# Patient Record
Sex: Female | Born: 1980 | Race: White | Hispanic: No | Marital: Single | Smoking: Former smoker
Health system: Southern US, Community
[De-identification: ages and names within clinical notes are randomized; demographics above are authoritative.]

## PROBLEM LIST (undated history)

## (undated) ENCOUNTER — Inpatient Hospital Stay (HOSPITAL_COMMUNITY): Payer: Self-pay

## (undated) DIAGNOSIS — F419 Anxiety disorder, unspecified: Secondary | ICD-10-CM

## (undated) DIAGNOSIS — F1910 Other psychoactive substance abuse, uncomplicated: Secondary | ICD-10-CM

## (undated) DIAGNOSIS — F32A Depression, unspecified: Secondary | ICD-10-CM

## (undated) DIAGNOSIS — B1920 Unspecified viral hepatitis C without hepatic coma: Secondary | ICD-10-CM

## (undated) DIAGNOSIS — G43909 Migraine, unspecified, not intractable, without status migrainosus: Secondary | ICD-10-CM

## (undated) DIAGNOSIS — I10 Essential (primary) hypertension: Secondary | ICD-10-CM

## (undated) DIAGNOSIS — F329 Major depressive disorder, single episode, unspecified: Secondary | ICD-10-CM

## (undated) DIAGNOSIS — M549 Dorsalgia, unspecified: Secondary | ICD-10-CM

## (undated) DIAGNOSIS — F4310 Post-traumatic stress disorder, unspecified: Secondary | ICD-10-CM

## (undated) DIAGNOSIS — G8929 Other chronic pain: Secondary | ICD-10-CM

## (undated) HISTORY — DX: Unspecified viral hepatitis C without hepatic coma: B19.20

## (undated) HISTORY — PX: TUBAL LIGATION: SHX77

## (undated) HISTORY — DX: Post-traumatic stress disorder, unspecified: F43.10

## (undated) HISTORY — PX: EYE SURGERY: SHX253

## (undated) HISTORY — PX: CARPAL TUNNEL RELEASE: SHX101

## (undated) HISTORY — PX: ABDOMINAL HYSTERECTOMY: SHX81

---

## 2009-05-15 ENCOUNTER — Emergency Department (HOSPITAL_COMMUNITY): Admission: EM | Admit: 2009-05-15 | Discharge: 2009-05-15 | Payer: Self-pay | Admitting: Emergency Medicine

## 2009-09-08 ENCOUNTER — Emergency Department (HOSPITAL_COMMUNITY): Admission: EM | Admit: 2009-09-08 | Discharge: 2009-09-08 | Payer: Self-pay | Admitting: Emergency Medicine

## 2009-09-09 ENCOUNTER — Emergency Department (HOSPITAL_COMMUNITY): Admission: EM | Admit: 2009-09-09 | Discharge: 2009-09-09 | Payer: Self-pay | Admitting: Emergency Medicine

## 2009-09-20 ENCOUNTER — Emergency Department (HOSPITAL_COMMUNITY): Admission: EM | Admit: 2009-09-20 | Discharge: 2009-09-20 | Payer: Self-pay | Admitting: Emergency Medicine

## 2010-05-29 ENCOUNTER — Emergency Department (HOSPITAL_COMMUNITY)
Admission: EM | Admit: 2010-05-29 | Discharge: 2010-05-29 | Disposition: A | Discharge status: Home / Self Care | Payer: Self-pay | Attending: Emergency Medicine | Admitting: Emergency Medicine

## 2010-05-29 DIAGNOSIS — F4310 Post-traumatic stress disorder, unspecified: Secondary | ICD-10-CM | POA: Insufficient documentation

## 2010-05-29 DIAGNOSIS — G2581 Restless legs syndrome: Secondary | ICD-10-CM | POA: Insufficient documentation

## 2010-05-29 DIAGNOSIS — F10229 Alcohol dependence with intoxication, unspecified: Secondary | ICD-10-CM | POA: Insufficient documentation

## 2010-05-29 DIAGNOSIS — Z79899 Other long term (current) drug therapy: Secondary | ICD-10-CM | POA: Insufficient documentation

## 2010-05-29 DIAGNOSIS — F919 Conduct disorder, unspecified: Secondary | ICD-10-CM | POA: Insufficient documentation

## 2010-05-29 DIAGNOSIS — F1910 Other psychoactive substance abuse, uncomplicated: Secondary | ICD-10-CM | POA: Insufficient documentation

## 2010-05-29 DIAGNOSIS — G8929 Other chronic pain: Secondary | ICD-10-CM | POA: Insufficient documentation

## 2010-05-29 DIAGNOSIS — I10 Essential (primary) hypertension: Secondary | ICD-10-CM | POA: Insufficient documentation

## 2010-05-29 LAB — BASIC METABOLIC PANEL
BUN: 2 mg/dL — ABNORMAL LOW (ref 6–23)
CO2: 24 mEq/L (ref 19–32)
Calcium: 9.7 mg/dL (ref 8.4–10.5)
Chloride: 105 mEq/L (ref 96–112)
Creatinine, Ser: 0.67 mg/dL (ref 0.4–1.2)
GFR calc Af Amer: >60 mL/min (ref >60)
GFR calc non Af Amer: >60 mL/min (ref >60)
Glucose, Bld: 114 mg/dL — ABNORMAL HIGH (ref 70–99)
Potassium: 3.4 mEq/L — ABNORMAL LOW (ref 3.5–5.1)
Sodium: 139 mEq/L (ref 135–145)

## 2010-05-29 LAB — POCT PREGNANCY, URINE: Preg Test, Ur: NEGATIVE

## 2010-05-29 LAB — CBC
HCT: 37.2 % (ref 36.0–46.0)
Hemoglobin: 13 g/dL (ref 12.0–15.0)
MCH: 32.2 pg (ref 26.0–34.0)
MCHC: 34.9 g/dL (ref 30.0–36.0)
MCV: 92.1 fL (ref 78.0–100.0)
Platelets: 333 10*3/uL (ref 150–400)
RBC: 4.04 MIL/uL (ref 3.87–5.11)
RDW: 12.8 % (ref 11.5–15.5)
WBC: 11.6 10*3/uL — ABNORMAL HIGH (ref 4.0–10.5)

## 2010-05-29 LAB — URINALYSIS, ROUTINE W REFLEX MICROSCOPIC
Bilirubin Urine: NEGATIVE
Glucose, UA: NEGATIVE mg/dL
Ketones, ur: NEGATIVE mg/dL
Leukocytes, UA: NEGATIVE
Nitrite: NEGATIVE
Protein, ur: NEGATIVE mg/dL
Specific Gravity, Urine: <1.005 — ABNORMAL LOW (ref 1.005–1.030)
Urobilinogen, UA: 0.2 mg/dL (ref 0.0–1.0)
pH: 6 (ref 5.0–8.0)

## 2010-05-29 LAB — RAPID URINE DRUG SCREEN, HOSP PERFORMED
Amphetamines: NOT DETECTED
Barbiturates: NOT DETECTED
Benzodiazepines: NOT DETECTED
Cocaine: NOT DETECTED
Opiates: NOT DETECTED
Tetrahydrocannabinol: NOT DETECTED

## 2010-05-29 LAB — URINE MICROSCOPIC-ADD ON

## 2010-05-29 LAB — DIFFERENTIAL
Basophils Absolute: 0 10*3/uL (ref 0.0–0.1)
Basophils Relative: 0 % (ref 0–1)
Eosinophils Absolute: 0.2 10*3/uL (ref 0.0–0.7)
Eosinophils Relative: 1 % (ref 0–5)
Lymphocytes Relative: 45 % (ref 12–46)
Lymphs Abs: 5.2 10*3/uL — ABNORMAL HIGH (ref 0.7–4.0)
Monocytes Absolute: 0.6 10*3/uL (ref 0.1–1.0)
Monocytes Relative: 5 % (ref 3–12)
Neutro Abs: 5.6 10*3/uL (ref 1.7–7.7)
Neutrophils Relative %: 48 % (ref 43–77)

## 2010-05-29 LAB — ETHANOL: Alcohol, Ethyl (B): 175 mg/dL — ABNORMAL HIGH (ref 0–10)

## 2010-07-23 ENCOUNTER — Emergency Department (HOSPITAL_COMMUNITY)
Admission: EM | Admit: 2010-07-23 | Discharge: 2010-07-23 | Disposition: A | Discharge status: Home / Self Care | Payer: Self-pay | Attending: Emergency Medicine | Admitting: Emergency Medicine

## 2010-07-23 DIAGNOSIS — G2581 Restless legs syndrome: Secondary | ICD-10-CM | POA: Insufficient documentation

## 2010-07-23 DIAGNOSIS — Z79899 Other long term (current) drug therapy: Secondary | ICD-10-CM | POA: Insufficient documentation

## 2010-07-23 DIAGNOSIS — R079 Chest pain, unspecified: Secondary | ICD-10-CM | POA: Insufficient documentation

## 2010-07-23 DIAGNOSIS — F4310 Post-traumatic stress disorder, unspecified: Secondary | ICD-10-CM | POA: Insufficient documentation

## 2010-07-23 DIAGNOSIS — F3289 Other specified depressive episodes: Secondary | ICD-10-CM | POA: Insufficient documentation

## 2010-07-23 DIAGNOSIS — F329 Major depressive disorder, single episode, unspecified: Secondary | ICD-10-CM | POA: Insufficient documentation

## 2010-07-23 DIAGNOSIS — M549 Dorsalgia, unspecified: Secondary | ICD-10-CM | POA: Insufficient documentation

## 2010-07-23 DIAGNOSIS — I10 Essential (primary) hypertension: Secondary | ICD-10-CM | POA: Insufficient documentation

## 2010-07-23 DIAGNOSIS — G4700 Insomnia, unspecified: Secondary | ICD-10-CM | POA: Insufficient documentation

## 2010-07-23 DIAGNOSIS — G8929 Other chronic pain: Secondary | ICD-10-CM | POA: Insufficient documentation

## 2010-07-23 DIAGNOSIS — F1210 Cannabis abuse, uncomplicated: Secondary | ICD-10-CM | POA: Insufficient documentation

## 2010-07-23 LAB — BASIC METABOLIC PANEL
BUN: 6 mg/dL (ref 6–23)
CO2: 25 mEq/L (ref 19–32)
Calcium: 9.4 mg/dL (ref 8.4–10.5)
Chloride: 101 mEq/L (ref 96–112)
Creatinine, Ser: 0.67 mg/dL (ref 0.50–1.10)
GFR calc Af Amer: >60 mL/min (ref >60)
GFR calc non Af Amer: >60 mL/min (ref >60)
Glucose, Bld: 100 mg/dL — ABNORMAL HIGH (ref 70–99)
Potassium: 3.8 mEq/L (ref 3.5–5.1)
Sodium: 136 mEq/L (ref 135–145)

## 2010-07-23 LAB — CBC
HCT: 36.4 % (ref 36.0–46.0)
Hemoglobin: 12.6 g/dL (ref 12.0–15.0)
MCH: 32.6 pg (ref 26.0–34.0)
MCHC: 34.6 g/dL (ref 30.0–36.0)
MCV: 94.3 fL (ref 78.0–100.0)
Platelets: 289 10*3/uL (ref 150–400)
RBC: 3.86 MIL/uL — ABNORMAL LOW (ref 3.87–5.11)
RDW: 13 % (ref 11.5–15.5)
WBC: 12.6 10*3/uL — ABNORMAL HIGH (ref 4.0–10.5)

## 2010-07-23 LAB — DIFFERENTIAL
Basophils Absolute: 0 10*3/uL (ref 0.0–0.1)
Basophils Relative: 0 % (ref 0–1)
Eosinophils Absolute: 0.1 10*3/uL (ref 0.0–0.7)
Eosinophils Relative: 0 % (ref 0–5)
Lymphocytes Relative: 16 % (ref 12–46)
Lymphs Abs: 2 10*3/uL (ref 0.7–4.0)
Monocytes Absolute: 0.8 10*3/uL (ref 0.1–1.0)
Monocytes Relative: 6 % (ref 3–12)
Neutro Abs: 9.7 10*3/uL — ABNORMAL HIGH (ref 1.7–7.7)
Neutrophils Relative %: 77 % (ref 43–77)

## 2010-07-23 LAB — TROPONIN I: Troponin I: <0.3 ng/mL (ref <0.30)

## 2010-07-23 LAB — CK TOTAL AND CKMB (NOT AT ARMC)
CK, MB: 1.6 ng/mL (ref 0.3–4.0)
Relative Index: INVALID (ref 0.0–2.5)
Total CK: 51 U/L (ref 7–177)

## 2010-07-23 LAB — RAPID STREP SCREEN (MED CTR MEBANE ONLY): Streptococcus, Group A Screen (Direct): NEGATIVE

## 2010-07-23 LAB — POCT PREGNANCY, URINE: Preg Test, Ur: NEGATIVE

## 2010-07-23 LAB — D-DIMER, QUANTITATIVE: D-Dimer, Quant: 0.39 ug/mL-FEU (ref 0.00–0.48)

## 2011-01-19 LAB — RPR: RPR: NONREACTIVE

## 2011-01-31 NOTE — L&D Delivery Note (Signed)
Delivery Note At 11:07 AM a viable and healthy female was delivered via  (Presentation: ROA ).  APGAR: 7, 9; weight pending.   Placenta status: delivered, intact.  Cord: 2 vessels with the following complications: none. 30 second shoulder dystocia resolved with McRoberts, suprapubic pressure.  Anesthesia: Epidural  Episiotomy: n/a Lacerations: none. Est. Blood Loss (mL): 100  Mom to postpartum.  Baby to nursery-stable.  Bedelia Person 06/14/2011, 11:19 AM  I was present for the delivery of this patient.  Agree with the above note.    Candelaria Celeste JEHIEL 06/14/2011 11:24 AM

## 2011-02-16 ENCOUNTER — Other Ambulatory Visit (HOSPITAL_COMMUNITY)
Admission: RE | Admit: 2011-02-16 | Discharge: 2011-02-16 | Disposition: A | Discharge status: Home / Self Care | Payer: Non-veteran care | Source: Ambulatory Visit | Attending: Obstetrics & Gynecology | Admitting: Obstetrics & Gynecology

## 2011-02-16 DIAGNOSIS — Z01419 Encounter for gynecological examination (general) (routine) without abnormal findings: Secondary | ICD-10-CM | POA: Insufficient documentation

## 2011-02-16 LAB — GC/CHLAMYDIA PROBE AMP, GENITAL
Chlamydia: NEGATIVE
Gonorrhea: NEGATIVE

## 2011-02-16 LAB — ABO/RH: RH Type: POSITIVE

## 2011-02-16 LAB — HEPATITIS B SURFACE ANTIGEN: Hepatitis B Surface Ag: NEGATIVE

## 2011-02-16 LAB — HIV ANTIBODY (ROUTINE TESTING W REFLEX): HIV: NONREACTIVE

## 2011-02-16 LAB — RUBELLA ANTIBODY, IGM: Rubella: IMMUNE

## 2011-05-10 ENCOUNTER — Encounter (HOSPITAL_COMMUNITY): Payer: Self-pay | Admitting: *Deleted

## 2011-05-10 ENCOUNTER — Inpatient Hospital Stay (HOSPITAL_COMMUNITY)
Admission: AD | Admit: 2011-05-10 | Discharge: 2011-05-11 | DRG: 781 | Disposition: A | Discharge status: Home / Self Care | Payer: Non-veteran care | Source: Ambulatory Visit | Attending: Obstetrics and Gynecology | Admitting: Obstetrics and Gynecology

## 2011-05-10 DIAGNOSIS — R030 Elevated blood-pressure reading, without diagnosis of hypertension: Secondary | ICD-10-CM | POA: Diagnosis present

## 2011-05-10 DIAGNOSIS — D72829 Elevated white blood cell count, unspecified: Secondary | ICD-10-CM | POA: Diagnosis present

## 2011-05-10 DIAGNOSIS — O212 Late vomiting of pregnancy: Secondary | ICD-10-CM | POA: Diagnosis present

## 2011-05-10 DIAGNOSIS — O219 Vomiting of pregnancy, unspecified: Secondary | ICD-10-CM

## 2011-05-10 DIAGNOSIS — K219 Gastro-esophageal reflux disease without esophagitis: Secondary | ICD-10-CM | POA: Diagnosis present

## 2011-05-10 DIAGNOSIS — O99891 Other specified diseases and conditions complicating pregnancy: Principal | ICD-10-CM | POA: Diagnosis present

## 2011-05-10 DIAGNOSIS — K529 Noninfective gastroenteritis and colitis, unspecified: Secondary | ICD-10-CM | POA: Diagnosis present

## 2011-05-10 HISTORY — DX: Essential (primary) hypertension: I10

## 2011-05-10 HISTORY — DX: Depression, unspecified: F32.A

## 2011-05-10 HISTORY — DX: Anxiety disorder, unspecified: F41.9

## 2011-05-10 HISTORY — DX: Major depressive disorder, single episode, unspecified: F32.9

## 2011-05-10 LAB — CBC
HCT: 35.3 % — ABNORMAL LOW (ref 36.0–46.0)
Hemoglobin: 11.9 g/dL — ABNORMAL LOW (ref 12.0–15.0)
MCH: 31.2 pg (ref 26.0–34.0)
MCHC: 33.7 g/dL (ref 30.0–36.0)
MCV: 92.4 fL (ref 78.0–100.0)
Platelets: 292 10*3/uL (ref 150–400)
RBC: 3.82 MIL/uL — ABNORMAL LOW (ref 3.87–5.11)
RDW: 14.4 % (ref 11.5–15.5)
WBC: 18.4 10*3/uL — ABNORMAL HIGH (ref 4.0–10.5)

## 2011-05-10 LAB — URINALYSIS, ROUTINE W REFLEX MICROSCOPIC
Bilirubin Urine: NEGATIVE
Glucose, UA: NEGATIVE mg/dL
Ketones, ur: >80 mg/dL — AB
Leukocytes, UA: NEGATIVE
Nitrite: NEGATIVE
Protein, ur: 30 mg/dL — AB
Specific Gravity, Urine: >1.03 — ABNORMAL HIGH (ref 1.005–1.030)
Urobilinogen, UA: 1 mg/dL (ref 0.0–1.0)
pH: 6 (ref 5.0–8.0)

## 2011-05-10 LAB — URINE MICROSCOPIC-ADD ON

## 2011-05-10 MED ORDER — SODIUM CHLORIDE 0.9 % IV SOLN
25.0000 mg | INTRAVENOUS | Status: DC
  Administered 2011-05-10: 25 mg via INTRAVENOUS
  Filled 2011-05-10: qty 1

## 2011-05-10 MED ORDER — ONDANSETRON 4 MG PO TBDP
4.0000 mg | ORAL_TABLET | Freq: Once | ORAL | Status: AC
  Administered 2011-05-10: 4 mg via ORAL
  Filled 2011-05-10: qty 1

## 2011-05-10 MED ORDER — LACTATED RINGERS IV BOLUS (SEPSIS)
1000.0000 mL | Freq: Once | INTRAVENOUS | Status: AC
  Administered 2011-05-11: 1000 mL via INTRAVENOUS

## 2011-05-10 NOTE — MAU Note (Signed)
Pt reports nausea and vomiting x 3 days, denies diarrhea. Headache. Denies vaginal bleeding or discharge.

## 2011-05-10 NOTE — MAU Provider Note (Signed)
Marcia Hood y.o.G2P1001 @[redacted]w[redacted]d  by LMP Chief Complaint  Patient presents with  . Emesis     First Provider Initiated Contact with Patient 05/10/11 2315      SUBJECTIVE  HPI:  3 d hx of vomiting progressively worsening and today unable to retain even water. Feels weak and fatigued. No diarrhea or fever. No dysuria, hematuria, back pain. No known contact with others ill. No abd pain, contractions, LOF or VB. Good FM.    Past Medical History  Diagnosis Date  . Hypertension   . Depression   . Anxiety    Past Surgical History  Procedure Date  . No past surgeries    History   Social History  . Marital Status: Single    Spouse Name: N/A    Number of Children: N/A  . Years of Education: N/A   Occupational History  . Not on file.   Social History Main Topics  . Smoking status: Current Everyday Smoker    Types: Cigarettes  . Smokeless tobacco: Not on file  . Alcohol Use: No  . Drug Use: No  . Sexually Active: Yes   Other Topics Concern  . Not on file   Social History Narrative  . No narrative on file   No current facility-administered medications on file prior to encounter.   Current Outpatient Prescriptions on File Prior to Encounter  Medication Sig Dispense Refill  . FLUoxetine (PROZAC) 20 MG capsule Take 20 mg by mouth daily.       Allergies  Allergen Reactions  . Penicillins Rash    ROS: Pertinent items in HPI  OBJECTIVE Blood pressure 137/85, pulse 83, temperature 97.3 F (36.3 C), temperature source Oral, resp. rate 20, height 5\' 4"  (1.626 m), weight 76.658 kg (169 lb).  Recheck 98.3  150/83 GENERAL: Well-developed, well-nourished female in no acute distress.  LUNGS: Clear to auscultation bilaterally.  HEART: Regular rate and rhythm. ABDOMEN: Soft, nontender EXTREMITIES: Nontender, no edema  LAB RESULTS Results for orders placed during the hospital encounter of 05/10/11 (from the past 24 hour(s))  URINALYSIS, ROUTINE W REFLEX MICROSCOPIC      Status: Abnormal   Collection Time   05/10/11 10:30 PM      Component Value Range   Color, Urine YELLOW  YELLOW    APPearance CLEAR  CLEAR    Specific Gravity, Urine >1.030 (*) 1.005 - 1.030    pH 6.0  5.0 - 8.0    Glucose, UA NEGATIVE  NEGATIVE (mg/dL)   Hgb urine dipstick TRACE (*) NEGATIVE    Bilirubin Urine NEGATIVE  NEGATIVE    Ketones, ur >80 (*) NEGATIVE (mg/dL)   Protein, ur 30 (*) NEGATIVE (mg/dL)   Urobilinogen, UA 1.0  0.0 - 1.0 (mg/dL)   Nitrite NEGATIVE  NEGATIVE    Leukocytes, UA NEGATIVE  NEGATIVE   URINE MICROSCOPIC-ADD ON     Status: Abnormal   Collection Time   05/10/11 10:30 PM      Component Value Range   Squamous Epithelial / LPF MANY (*) RARE    WBC, UA 0-2  <3 (WBC/hpf)   RBC / HPF 0-2  <3 (RBC/hpf)   Bacteria, UA RARE  RARE    Casts HYALINE CASTS (*) NEGATIVE    Urine-Other MUCOUS PRESENT    COMPREHENSIVE METABOLIC PANEL     Status: Abnormal   Collection Time   05/10/11 11:30 PM      Component Value Range   Sodium 139  135 - 145 (mEq/L)  Potassium 3.3 (*) 3.5 - 5.1 (mEq/L)   Chloride 103  96 - 112 (mEq/L)   CO2 12 (*) 19 - 32 (mEq/L)   Glucose, Bld 70  70 - 99 (mg/dL)   BUN 4 (*) 6 - 23 (mg/dL)   Creatinine, Ser 4.54  0.50 - 1.10 (mg/dL)   Calcium 9.6  8.4 - 09.8 (mg/dL)   Total Protein 6.8  6.0 - 8.3 (g/dL)   Albumin 3.4 (*) 3.5 - 5.2 (g/dL)   AST 14  0 - 37 (U/L)   ALT 11  0 - 35 (U/L)   Alkaline Phosphatase 94  39 - 117 (U/L)   Total Bilirubin 0.4  0.3 - 1.2 (mg/dL)   GFR calc non Af Amer >90  >90 (mL/min)   GFR calc Af Amer >90  >90 (mL/min)  CBC     Status: Abnormal   Collection Time   05/10/11 11:30 PM      Component Value Range   WBC 18.4 (*) 4.0 - 10.5 (K/uL)   RBC 3.82 (*) 3.87 - 5.11 (MIL/uL)   Hemoglobin 11.9 (*) 12.0 - 15.0 (g/dL)   HCT 11.9 (*) 14.7 - 46.0 (%)   MCV 92.4  78.0 - 100.0 (fL)   MCH 31.2  26.0 - 34.0 (pg)   MCHC 33.7  30.0 - 36.0 (g/dL)   RDW 82.9  56.2 - 13.0 (%)   Platelets 292  150 - 400 (K/uL)  DIFFERENTIAL      Status: Normal (Preliminary result)   Collection Time   05/10/11 11:30 PM      Component Value Range   Neutrophils Relative PENDING  43 - 77 (%)   Neutro Abs PENDING  1.7 - 7.7 (K/uL)   Band Neutrophils PENDING  0 - 10 (%)   Lymphocytes Relative PENDING  12 - 46 (%)   Lymphs Abs PENDING  0.7 - 4.0 (K/uL)   Monocytes Relative PENDING  3 - 12 (%)   Monocytes Absolute PENDING  0.1 - 1.0 (K/uL)   Eosinophils Relative PENDING  0 - 5 (%)   Eosinophils Absolute PENDING  0.0 - 0.7 (K/uL)   Basophils Relative PENDING  0 - 1 (%)   Basophils Absolute PENDING  0.0 - 0.1 (K/uL)   WBC Morphology PENDING     RBC Morphology PENDING     Smear Review PENDING     nRBC PENDING  0 (/100 WBC)   Metamyelocytes Relative PENDING     Myelocytes PENDING     Promyelocytes Absolute PENDING     Blasts PENDING      MAU Course:   Zofran ODT 4mg , IV LR with Phenergan After first 1000cc infused, still vomiting and c/o indigestion. Will give IV Pepcid and a second bag of IVF with Zofran Still vomiting   ASSESSMENT G2P1001 with N/V Dehydratioin Leukocytosis BP elevation  PLAN C/W Dr. Emelda Fear -> Will admit

## 2011-05-11 ENCOUNTER — Encounter (HOSPITAL_COMMUNITY): Payer: Self-pay

## 2011-05-11 DIAGNOSIS — O219 Vomiting of pregnancy, unspecified: Secondary | ICD-10-CM

## 2011-05-11 DIAGNOSIS — K529 Noninfective gastroenteritis and colitis, unspecified: Secondary | ICD-10-CM | POA: Diagnosis present

## 2011-05-11 LAB — COMPREHENSIVE METABOLIC PANEL
ALT: 11 U/L (ref 0–35)
AST: 14 U/L (ref 0–37)
Albumin: 3.4 g/dL — ABNORMAL LOW (ref 3.5–5.2)
Alkaline Phosphatase: 94 U/L (ref 39–117)
BUN: 4 mg/dL — ABNORMAL LOW (ref 6–23)
CO2: 12 mEq/L — ABNORMAL LOW (ref 19–32)
Calcium: 9.6 mg/dL (ref 8.4–10.5)
Chloride: 103 mEq/L (ref 96–112)
Creatinine, Ser: 0.65 mg/dL (ref 0.50–1.10)
GFR calc Af Amer: >90 mL/min (ref >90)
GFR calc non Af Amer: >90 mL/min (ref >90)
Glucose, Bld: 70 mg/dL (ref 70–99)
Potassium: 3.3 mEq/L — ABNORMAL LOW (ref 3.5–5.1)
Sodium: 139 mEq/L (ref 135–145)
Total Bilirubin: 0.4 mg/dL (ref 0.3–1.2)
Total Protein: 6.8 g/dL (ref 6.0–8.3)

## 2011-05-11 LAB — DIFFERENTIAL
Basophils Absolute: 0.1 10*3/uL (ref 0.0–0.1)
Basophils Relative: 0 % (ref 0–1)
Eosinophils Absolute: 0 10*3/uL (ref 0.0–0.7)
Eosinophils Relative: 0 % (ref 0–5)
Lymphocytes Relative: 14 % (ref 12–46)
Lymphs Abs: 2.7 10*3/uL (ref 0.7–4.0)
Monocytes Absolute: 1 10*3/uL (ref 0.1–1.0)
Monocytes Relative: 5 % (ref 3–12)
Neutro Abs: 15.1 10*3/uL — ABNORMAL HIGH (ref 1.7–7.7)
Neutrophils Relative %: 80 % — ABNORMAL HIGH (ref 43–77)

## 2011-05-11 LAB — CBC
HCT: 30.4 % — ABNORMAL LOW (ref 36.0–46.0)
Hemoglobin: 10 g/dL — ABNORMAL LOW (ref 12.0–15.0)
MCH: 30.5 pg (ref 26.0–34.0)
MCHC: 32.9 g/dL (ref 30.0–36.0)
MCV: 92.7 fL (ref 78.0–100.0)
Platelets: 264 10*3/uL (ref 150–400)
RBC: 3.28 MIL/uL — ABNORMAL LOW (ref 3.87–5.11)
RDW: 14.4 % (ref 11.5–15.5)
WBC: 15.6 10*3/uL — ABNORMAL HIGH (ref 4.0–10.5)

## 2011-05-11 LAB — PROTEIN / CREATININE RATIO, URINE
Creatinine, Urine: 98.33 mg/dL
Protein Creatinine Ratio: 0.29 — ABNORMAL HIGH (ref 0.00–0.15)
Total Protein, Urine: 29 mg/dL

## 2011-05-11 MED ORDER — CALCIUM CARBONATE ANTACID 500 MG PO CHEW
2.0000 | CHEWABLE_TABLET | ORAL | Status: DC | PRN
  Filled 2011-05-11: qty 2

## 2011-05-11 MED ORDER — METOCLOPRAMIDE HCL 5 MG/ML IJ SOLN: 10.0000 mg | Freq: Once | INTRAMUSCULAR | Status: DC

## 2011-05-11 MED ORDER — PROMETHAZINE HCL 25 MG PO TABS: 25.0000 mg | ORAL_TABLET | Freq: Four times a day (QID) | ORAL | Status: DC | PRN

## 2011-05-11 MED ORDER — ONDANSETRON 4 MG PO TBDP
4.0000 mg | ORAL_TABLET | Freq: Once | ORAL | Status: AC
  Administered 2011-05-11: 4 mg via ORAL
  Filled 2011-05-11: qty 1

## 2011-05-11 MED ORDER — FAMOTIDINE IN NACL 20-0.9 MG/50ML-% IV SOLN
20.0000 mg | Freq: Once | INTRAVENOUS | Status: AC
  Administered 2011-05-11: 20 mg via INTRAVENOUS
  Filled 2011-05-11: qty 50

## 2011-05-11 MED ORDER — FAMOTIDINE IN NACL 20-0.9 MG/50ML-% IV SOLN: 20.0000 mg | Freq: Once | INTRAVENOUS | Status: DC

## 2011-05-11 MED ORDER — PRENATAL MULTIVITAMIN CH
1.0000 | ORAL_TABLET | Freq: Every day | ORAL | Status: DC
  Filled 2011-05-11: qty 1

## 2011-05-11 MED ORDER — DOCUSATE SODIUM 100 MG PO CAPS
100.0000 mg | ORAL_CAPSULE | Freq: Every day | ORAL | Status: DC
  Filled 2011-05-11: qty 1

## 2011-05-11 MED ORDER — ACETAMINOPHEN 325 MG PO TABS: 650.0000 mg | ORAL_TABLET | ORAL | Status: DC | PRN

## 2011-05-11 MED ORDER — ZOLPIDEM TARTRATE 10 MG PO TABS: 10.0000 mg | ORAL_TABLET | Freq: Every evening | ORAL | Status: DC | PRN

## 2011-05-11 NOTE — Progress Notes (Signed)
UR Chart review completed.  

## 2011-05-11 NOTE — H&P (Signed)
Marcia Hood is a 31 y.o. female presenting for vomiting. Maternal Medical History:  Reason for admission: Reason for admission: nausea.  Contractions: Not contracting  Fetal activity: Perceived fetal activity is normal.   Last perceived fetal movement was within the past hour.    Prenatal complications: 2 vessel cord on [place]  Prenatal Complications - Diabetes: none.    OB History    Grav Para Term Preterm Abortions TAB SAB Ect Mult Living   2 1 1  0 0 0 0 0 0 1     Past Medical History  Diagnosis Date  . Hypertension   . Depression   . Anxiety    Past Surgical History  Procedure Date  . No past surgeries    Family History: family history is negative for Anesthesia problems. Social History:  reports that she has been smoking Cigarettes.  She does not have any smokeless tobacco history on file. She reports that she does not drink alcohol or use illicit drugs.  Review of Systems  Constitutional: Negative for fever.  HENT: Negative for neck pain.   Eyes: Negative for blurred vision.  Respiratory: Negative for cough.   Cardiovascular: Negative for chest pain and palpitations.  Gastrointestinal: Positive for heartburn, nausea, vomiting and abdominal pain. Negative for diarrhea and constipation.       Last BM 4 days ago. Diffuse abd pain she attributes to vomiting  Genitourinary: Negative for dysuria, urgency, frequency, hematuria and flank pain.  Musculoskeletal: Negative for back pain.  Skin: Negative for rash.  Neurological: Negative for headaches.      Blood pressure 136/82, pulse 82, temperature 98.9 F (37.2 C), temperature source Oral, resp. rate 20, height 5\' 4"  (1.626 m), weight 76.658 kg (169 lb). Maternal Exam:  Uterine Assessment: Not contracting  Abdomen: Fetal presentation: vertex Minimally tender over SP Diminished BS Fundus c/w 34 wks  Introitus: Not examined  Pelvis: adequate for delivery.   Cervix: not evaluated.   Fetal Exam Fetal Monitor  Review: Mode: ultrasound.   Baseline rate: 130.  Variability: moderate (6-25 bpm).   Pattern: accelerations present and no decelerations.    Fetal State Assessment: Category I - tracings are normal.     Physical Exam  Constitutional: She is oriented to person, place, and time. She appears well-developed and well-nourished. She appears distressed.  HENT:  Head: Normocephalic.  Neck: Normal range of motion.  Cardiovascular: Normal rate and regular rhythm.   Respiratory: Effort normal and breath sounds normal. No respiratory distress. She has no wheezes.  GI: Soft. She exhibits no distension. There is no tenderness. There is no rebound and no guarding.       BS diminished  Musculoskeletal: Normal range of motion.  Neurological: She is alert and oriented to person, place, and time. She has normal reflexes.  Skin: Skin is warm and dry.  Psychiatric: She has a normal mood and affect.    Prenatal labs: ABO, Rh:  A+ Antibody:  neg Rubella:  Imm RPR:   NR HBsAg:   neg HIV:   NR GBS:   not doen Results for orders placed during the hospital encounter of 05/10/11 (from the past 24 hour(s))  URINALYSIS, ROUTINE W REFLEX MICROSCOPIC     Status: Abnormal   Collection Time   05/10/11 10:30 PM      Component Value Range   Color, Urine YELLOW  YELLOW    APPearance CLEAR  CLEAR    Specific Gravity, Urine >1.030 (*) 1.005 - 1.030  pH 6.0  5.0 - 8.0    Glucose, UA NEGATIVE  NEGATIVE (mg/dL)   Hgb urine dipstick TRACE (*) NEGATIVE    Bilirubin Urine NEGATIVE  NEGATIVE    Ketones, ur >80 (*) NEGATIVE (mg/dL)   Protein, ur 30 (*) NEGATIVE (mg/dL)   Urobilinogen, UA 1.0  0.0 - 1.0 (mg/dL)   Nitrite NEGATIVE  NEGATIVE    Leukocytes, UA NEGATIVE  NEGATIVE   URINE MICROSCOPIC-ADD ON     Status: Abnormal   Collection Time   05/10/11 10:30 PM      Component Value Range   Squamous Epithelial / LPF MANY (*) RARE    WBC, UA 0-2  <3 (WBC/hpf)   RBC / HPF 0-2  <3 (RBC/hpf)   Bacteria, UA RARE   RARE    Casts HYALINE CASTS (*) NEGATIVE    Urine-Other MUCOUS PRESENT    COMPREHENSIVE METABOLIC PANEL     Status: Abnormal   Collection Time   05/10/11 11:30 PM      Component Value Range   Sodium 139  135 - 145 (mEq/L)   Potassium 3.3 (*) 3.5 - 5.1 (mEq/L)   Chloride 103  96 - 112 (mEq/L)   CO2 12 (*) 19 - 32 (mEq/L)   Glucose, Bld 70  70 - 99 (mg/dL)   BUN 4 (*) 6 - 23 (mg/dL)   Creatinine, Ser 7.82  0.50 - 1.10 (mg/dL)   Calcium 9.6  8.4 - 95.6 (mg/dL)   Total Protein 6.8  6.0 - 8.3 (g/dL)   Albumin 3.4 (*) 3.5 - 5.2 (g/dL)   AST 14  0 - 37 (U/L)   ALT 11  0 - 35 (U/L)   Alkaline Phosphatase 94  39 - 117 (U/L)   Total Bilirubin 0.4  0.3 - 1.2 (mg/dL)   GFR calc non Af Amer >90  >90 (mL/min)   GFR calc Af Amer >90  >90 (mL/min)  CBC     Status: Abnormal   Collection Time   05/10/11 11:30 PM      Component Value Range   WBC 18.4 (*) 4.0 - 10.5 (K/uL)   RBC 3.82 (*) 3.87 - 5.11 (MIL/uL)   Hemoglobin 11.9 (*) 12.0 - 15.0 (g/dL)   HCT 21.3 (*) 08.6 - 46.0 (%)   MCV 92.4  78.0 - 100.0 (fL)   MCH 31.2  26.0 - 34.0 (pg)   MCHC 33.7  30.0 - 36.0 (g/dL)   RDW 57.8  46.9 - 62.9 (%)   Platelets 292  150 - 400 (K/uL)  DIFFERENTIAL     Status: Abnormal   Collection Time   05/10/11 11:30 PM      Component Value Range   Neutrophils Relative 80 (*) 43 - 77 (%)   Neutro Abs 15.1 (*) 1.7 - 7.7 (K/uL)   Lymphocytes Relative 14  12 - 46 (%)   Lymphs Abs 2.7  0.7 - 4.0 (K/uL)   Monocytes Relative 5  3 - 12 (%)   Monocytes Absolute 1.0  0.1 - 1.0 (K/uL)   Eosinophils Relative 0  0 - 5 (%)   Eosinophils Absolute 0.0  0.0 - 0.7 (K/uL)   Basophils Relative 0  0 - 1 (%)   Basophils Absolute 0.1  0.0 - 0.1 (K/uL)   Assessment/Plan: Persistent vomiting and dehydration GERD Leukocytosis  BP elevations 2VC  C/W Dr. Emelda Fear Will continue to rehydrate, tx with Pepcid and antiemetics. Repeat CBC in am. Check Preeclampsia labs  Georga Stys 05/11/2011, 2:20 AM  A+

## 2011-05-11 NOTE — MAU Note (Signed)
Pt continues to feel indigestion and then will shortly vomit 200-300cc yellowish clear emesis

## 2011-05-11 NOTE — Discharge Summary (Signed)
Physician Discharge Summary  Patient ID: Marcia Hood MRN: [id number] DOB/AGE: [birth year] 31 y.o.  Admit date: 05/10/2011 Discharge date: 05/11/2011  Admission Diagnoses:gastroenteritis, dehydration   Discharge Diagnoses: same, improved Principal Problem:  *Gastroenteritis, acute   Discharged Condition: good  Hospital Course: admitted with dehydiration and wbc 18k, with Protein/ Creatinine ration .29   Consults: None  Significant Diagnostic Studies:   Treatments: IV hydration and antiemetics per IV  Discharge Exam: Blood pressure 141/84, pulse 80, temperature 98 F (36.7 C), temperature source Oral, resp. rate 18, height 5\' 4"  (1.626 m), weight 76.658 kg (169 lb). General appearance: alert and no distress GI: soft, non-tender; bowel sounds normal; no masses,  no organomegaly  Disposition: 01-Home or Self Care  Discharge Orders    Future Orders Please Complete By Expires   HIV antibody      Comments:   This external order was created through the Results Console.   GC/chlamydia probe amp, genital      Comments:   This external order was created through the Results Console.   Rubella antibody, IgM      Comments:   This external order was created through the Results Console.   Hepatitis B surface antigen      Comments:   This external order was created through the Results Console.   RPR      Comments:   This external order was created through the Results Console.   ABO/Rh      Comments:   This external order was created through the Results Console.     Medication List  As of 05/11/2011  9:02 AM   TAKE these medications         acetaminophen 325 MG tablet   Commonly known as: TYLENOL   Take 650 mg by mouth every 6 (six) hours as needed. For pain      calcium carbonate 500 MG chewable tablet   Commonly known as: TUMS - dosed in mg elemental calcium   Chew 1 tablet by mouth 2 (two) times daily as needed. For heartburn      FLUoxetine 20 MG capsule   Commonly  known as: PROZAC   Take 20 mg by mouth daily.      prenatal multivitamin Tabs   Take 1 tablet by mouth daily.      promethazine 25 MG tablet   Commonly known as: PHENERGAN   Take 1 tablet (25 mg total) by mouth every 6 (six) hours as needed for nausea.           Follow-up Information    Follow up with Tilda Burrow, MD. (call for appt tomorrow, bring in urine container)    Contact information:   [facility] [address], [address] [place] [address] [phone]          Signed: Tilda Burrow 05/11/2011, 9:02 AM

## 2011-05-11 NOTE — Discharge Instructions (Signed)
Morning Sickness Morning sickness is when you feel sick to your stomach (nauseous) during pregnancy. This nauseous feeling may or may not come with throwing up (vomiting). It often occurs in the morning, but can be a problem any time of day. While morning sickness is unpleasant, it is usually harmless unless you develop severe and continual vomiting (hyperemesis gravidarum). This condition requires more intense treatment. CAUSES  The cause of morning sickness is not completely known but seems to be related to a sudden increase of two hormones:   Human chorionic gonadotropin (hCG).   Estrogen hormone.  These are elevated in the first part of the pregnancy. TREATMENT  Do not use any medicines (prescription, over-the-counter, or herbal) for morning sickness without first talking to your caregiver. Some patients are helped by the following:  Vitamin B6 (25mg  every 8 hours) or vitamin B6 shots.   An antihistamine called doxylamine (10mg  every 8 hours).   The herbal medication ginger.  HOME CARE INSTRUCTIONS   Taking multivitamins before getting pregnant can prevent or decrease the severity of morning sickness in most women.   Eat a piece of dry toast or unsalted crackers before getting out of bed in the morning.   Eat 5 or 6 small meals a day.   Eat dry and bland foods (rice, baked potato).   Do not drink liquids with your meals. Drink liquids between meals.   Avoid greasy, fatty, and spicy foods.   Get someone to cook for you if the smell of any food causes nausea and vomiting.   Avoid vitamin pills with iron because iron can cause nausea.   Snack on protein foods between meals if you are hungry.   Eat unsweetened gelatins for deserts.   Wear an acupressure wristband (worn for sea sickness) may be helpful.   Acupuncture may be helpful.   Do not smoke.   Get a humidifier to keep the air in your house free of odors.  SEEK MEDICAL CARE IF:   Your home remedies are not working  and you need medication.   You feel dizzy or lightheaded.   You are losing weight.   You need help with your diet.  SEEK IMMEDIATE MEDICAL CARE IF:   You have persistent and uncontrolled nausea and vomiting.   You pass out (faint).   You have a fever.  MAKE SURE YOU:   Understand these instructions.   Will watch your condition.   Will get help right away if you are not doing well or get worse.  Document Released: 03/09/2006 Document Revised: 01/05/2011 Document Reviewed: 01/04/2007 [facility] Patient Information 2012 [place], [place].Morning Sickness Morning sickness is when you feel sick to your stomach (nauseous) during pregnancy. This nauseous feeling may or may not come with throwing up (vomiting). It often occurs in the morning, but can be a problem any time of day. While morning sickness is unpleasant, it is usually harmless unless you develop severe and continual vomiting (hyperemesis gravidarum). This condition requires more intense treatment. CAUSES  The cause of morning sickness is not completely known but seems to be related to a sudden increase of two hormones:   Human chorionic gonadotropin (hCG).   Estrogen hormone.  These are elevated in the first part of the pregnancy. TREATMENT  Do not use any medicines (prescription, over-the-counter, or herbal) for morning sickness without first talking to your caregiver. Some patients are helped by the following:  Vitamin B6 (25mg  every 8 hours) or vitamin B6 shots.   An antihistamine  called doxylamine (10mg  every 8 hours).   The herbal medication ginger.  HOME CARE INSTRUCTIONS   Taking multivitamins before getting pregnant can prevent or decrease the severity of morning sickness in most women.   Eat a piece of dry toast or unsalted crackers before getting out of bed in the morning.   Eat 5 or 6 small meals a day.   Eat dry and bland foods (rice, baked potato).   Do not drink liquids with your meals. Drink liquids  between meals.   Avoid greasy, fatty, and spicy foods.   Get someone to cook for you if the smell of any food causes nausea and vomiting.   Avoid vitamin pills with iron because iron can cause nausea.   Snack on protein foods between meals if you are hungry.   Eat unsweetened gelatins for deserts.   Wear an acupressure wristband (worn for sea sickness) may be helpful.   Acupuncture may be helpful.   Do not smoke.   Get a humidifier to keep the air in your house free of odors.  SEEK MEDICAL CARE IF:   Your home remedies are not working and you need medication.   You feel dizzy or lightheaded.   You are losing weight.   You need help with your diet.  SEEK IMMEDIATE MEDICAL CARE IF:   You have persistent and uncontrolled nausea and vomiting.   You pass out (faint).   You have a fever.  MAKE SURE YOU:   Understand these instructions.   Will watch your condition.   Will get help right away if you are not doing well or get worse.  Document Released: 03/09/2006 Document Revised: 01/05/2011 Document Reviewed: 01/04/2007 [employer] Patient Information 2012 [employer], LLMorning Sickness Morning sickness is when you feel sick to your stomach (nauseous) during pregnancy. This nauseous feeling may or may not come with throwing up (vomiting). It often occurs in the morning, but can be a problem any time of day. While morning sickness is unpleasant, it is usually harmless unless you develop severe and continual vomiting (hyperemesis gravidarum). This condition requires more intense treatment. CAUSES  The cause of morning sickness is not completely known but seems to be related to a sudden increase of two hormones:   Human chorionic gonadotropin (hCG).   Estrogen hormone.  These are elevated in the first part of the pregnancy. TREATMENT  Do not use any medicines (prescription, over-the-counter, or herbal) for morning sickness without first talking to your caregiver. Some patients  are helped by the following:  Vitamin B6 (25mg  every 8 hours) or vitamin B6 shots.   An antihistamine called doxylamine (10mg  every 8 hours).   The herbal medication ginger.  HOME CARE INSTRUCTIONS   Taking multivitamins before getting pregnant can prevent or decrease the severity of morning sickness in most women.   Eat a piece of dry toast or unsalted crackers before getting out of bed in the morning.   Eat 5 or 6 small meals a day.   Eat dry and bland foods (rice, baked potato).   Do not drink liquids with your meals. Drink liquids between meals.   Avoid greasy, fatty, and spicy foods.   Get someone to cook for you if the smell of any food causes nausea and vomiting.   Avoid vitamin pills with iron because iron can cause nausea.   Snack on protein foods between meals if you are hungry.   Eat unsweetened gelatins for deserts.   Wear an acupressure wristband (worn  for sea sickness) may be helpful.   Acupuncture may be helpful.   Do not smoke.   Get a humidifier to keep the air in your house free of odors.  SEEK MEDICAL CARE IF:   Your home remedies are not working and you need medication.   You feel dizzy or lightheaded.   You are losing weight.   You need help with your diet.  SEEK IMMEDIATE MEDICAL CARE IF:   You have persistent and uncontrolled nausea and vomiting.   You pass out (faint).   You have a fever.  MAKE SURE YOU:   Understand these instructions.   Will watch your condition.   Will get help right away if you are not doing well or get worse.  Document Released: 03/09/2006 Document Revised: 01/05/2011 Document Reviewed: 01/04/2007 [facility] Patient Information 2012 [place], [place].C.

## 2011-05-11 NOTE — MAU Note (Signed)
Pt continues to vomit 200-400 cc at a time-states she feels slightly better-sttes she isn't really nauseated-but feels severe indigestion and then will vomit

## 2011-05-11 NOTE — Progress Notes (Signed)
Pt given discharge instructions, pt verbalized understanding, pt denies pain,nausea or vomiting, pt denies uc's, pt sent home with husband via wheelchair and 24 hour urine to be returned to physician's office.

## 2011-05-11 NOTE — Progress Notes (Signed)
Sharolyn Douglas, CNM contacted by phone to review previous nursing documentation for brown contact precautions.  No order for contact precautions at this time.  Labs reviewed with CNM.

## 2011-05-24 NOTE — MAU Provider Note (Signed)
Attestation of Attending Supervision of Advanced Practitioner: Evaluation and management procedures were performed by the PA/NP/CNM/OB Fellow under my supervision/collaboration. Chart reviewed and agree with management and plan.  Willson Lipa V 05/24/2011 8:24 PM

## 2011-05-24 NOTE — H&P (Signed)
Attestation of Attending Supervision of Advanced Practitioner: Evaluation and management procedures were performed by the PA/NP/CNM/OB Fellow under my supervision/collaboration. Chart reviewed and agree with management and plan.  Kie Calvin V 05/24/2011 8:22 PM

## 2011-05-30 LAB — OB RESULTS CONSOLE GBS: GBS: NEGATIVE

## 2011-06-11 ENCOUNTER — Encounter (HOSPITAL_COMMUNITY): Payer: Self-pay | Admitting: *Deleted

## 2011-06-11 ENCOUNTER — Inpatient Hospital Stay (HOSPITAL_COMMUNITY)
Admission: AD | Admit: 2011-06-11 | Discharge: 2011-06-11 | Disposition: A | Discharge status: Home / Self Care | Payer: Non-veteran care | Source: Ambulatory Visit | Attending: Obstetrics and Gynecology | Admitting: Obstetrics and Gynecology

## 2011-06-11 DIAGNOSIS — O169 Unspecified maternal hypertension, unspecified trimester: Secondary | ICD-10-CM

## 2011-06-11 DIAGNOSIS — O139 Gestational [pregnancy-induced] hypertension without significant proteinuria, unspecified trimester: Secondary | ICD-10-CM

## 2011-06-11 DIAGNOSIS — O479 False labor, unspecified: Secondary | ICD-10-CM | POA: Insufficient documentation

## 2011-06-11 LAB — COMPREHENSIVE METABOLIC PANEL
ALT: 11 U/L (ref 0–35)
AST: 14 U/L (ref 0–37)
Albumin: 2.8 g/dL — ABNORMAL LOW (ref 3.5–5.2)
Alkaline Phosphatase: 99 U/L (ref 39–117)
BUN: 6 mg/dL (ref 6–23)
CO2: 23 mEq/L (ref 19–32)
Calcium: 9.1 mg/dL (ref 8.4–10.5)
Chloride: 102 mEq/L (ref 96–112)
Creatinine, Ser: 0.5 mg/dL (ref 0.50–1.10)
GFR calc Af Amer: >90 mL/min (ref >90)
GFR calc non Af Amer: >90 mL/min (ref >90)
Glucose, Bld: 91 mg/dL (ref 70–99)
Potassium: 3.1 mEq/L — ABNORMAL LOW (ref 3.5–5.1)
Sodium: 136 mEq/L (ref 135–145)
Total Bilirubin: 0.3 mg/dL (ref 0.3–1.2)
Total Protein: 6 g/dL (ref 6.0–8.3)

## 2011-06-11 LAB — CBC
HCT: 31.6 % — ABNORMAL LOW (ref 36.0–46.0)
Hemoglobin: 10.5 g/dL — ABNORMAL LOW (ref 12.0–15.0)
MCH: 30.2 pg (ref 26.0–34.0)
MCHC: 33.2 g/dL (ref 30.0–36.0)
MCV: 90.8 fL (ref 78.0–100.0)
Platelets: 281 10*3/uL (ref 150–400)
RBC: 3.48 MIL/uL — ABNORMAL LOW (ref 3.87–5.11)
RDW: 13.9 % (ref 11.5–15.5)
WBC: 10.3 10*3/uL (ref 4.0–10.5)

## 2011-06-11 LAB — URINALYSIS, ROUTINE W REFLEX MICROSCOPIC
Bilirubin Urine: NEGATIVE
Glucose, UA: NEGATIVE mg/dL
Hgb urine dipstick: NEGATIVE
Ketones, ur: NEGATIVE mg/dL
Leukocytes, UA: NEGATIVE
Nitrite: NEGATIVE
Protein, ur: NEGATIVE mg/dL
Specific Gravity, Urine: 1.01 (ref 1.005–1.030)
Urobilinogen, UA: 0.2 mg/dL (ref 0.0–1.0)
pH: 7.5 (ref 5.0–8.0)

## 2011-06-11 MED ORDER — ZOLPIDEM TARTRATE 5 MG PO TABS
5.0000 mg | ORAL_TABLET | Freq: Once | ORAL | Status: AC
  Administered 2011-06-11: 5 mg via ORAL
  Filled 2011-06-11: qty 1

## 2011-06-11 MED ORDER — CYCLOBENZAPRINE HCL 10 MG PO TABS
10.0000 mg | ORAL_TABLET | Freq: Once | ORAL | Status: AC
  Administered 2011-06-11: 10 mg via ORAL
  Filled 2011-06-11: qty 1

## 2011-06-11 NOTE — Discharge Instructions (Signed)

## 2011-06-11 NOTE — MAU Note (Signed)
Contractions most of the day. No bleeding. Good fetal movement.

## 2011-06-11 NOTE — MAU Provider Note (Signed)
Chief Complaint:  Labor Eval    First Provider Initiated Contact with Patient 06/11/11 2236      Marcia Hood is  31 y.o. G2P1001.  No LMP recorded. Patient is pregnant..  [redacted]w[redacted]d  She presents complaining of Labor Eval . Onset is described as ongoing and has been present for  1 days. Reports sharp lower back pain and vaginal pressure. Reports office appt tomorrow and plan for IOL on Wednesday.   Obstetrical/Gynecological History: OB History    Grav Para Term Preterm Abortions TAB SAB Ect Mult Living   2 1 1  0 0 0 0 0 0 1      Past Medical History: Past Medical History  Diagnosis Date  . Hypertension   . Depression   . Anxiety     Past Surgical History: Past Surgical History  Procedure Date  . No past surgeries     Family History: Family History  Problem Relation Age of Onset  . Anesthesia problems Neg Hx     Social History: History  Substance Use Topics  . Smoking status: Current Everyday Smoker    Types: Cigarettes  . Smokeless tobacco: Not on file  . Alcohol Use: No    Allergies:  Allergies  Allergen Reactions  . Penicillins Rash    Prescriptions prior to admission  Medication Sig Dispense Refill  . acetaminophen (TYLENOL) 325 MG tablet Take 650 mg by mouth every 6 (six) hours as needed. For pain      . calcium carbonate (TUMS - DOSED IN MG ELEMENTAL CALCIUM) 500 MG chewable tablet Chew 1 tablet by mouth 2 (two) times daily as needed. For heartburn      . FLUoxetine (PROZAC) 20 MG capsule Take 20 mg by mouth daily.      . Prenatal Vit-Fe Fumarate-FA (PRENATAL MULTIVITAMIN) TABS Take 1 tablet by mouth daily.      . promethazine (PHENERGAN) 25 MG tablet Take 1 tablet (25 mg total) by mouth every 6 (six) hours as needed for nausea.  20 tablet  0    Review of Systems - History obtained from the patient Respiratory ROS: no cough, shortness of breath, or wheezing Cardiovascular ROS: no chest pain or dyspnea on exertion Gastrointestinal ROS: irregular  contractions, sharp back pain Genito-Urinary ROS: no dysuria, trouble voiding, or hematuria Vaginal pressure  Physical Exam   Blood pressure 146/87, pulse 85, temperature 98.1 F (36.7 C), temperature source Oral, resp. rate 20, height 5\' 5"  (1.651 m), weight 176 lb (79.833 kg).  General: General appearance - alert, well appearing, and in no distress and oriented to person, place, and time Mental status - alert, oriented to person, place, and time, normal mood, behavior, speech, dress, motor activity, and thought processes, affect appropriate to mood Eyes - left eye normal, right eye normal Chest - clear to auscultation, no wheezes, rales or rhonchi, symmetric air entry Heart - normal rate, regular rhythm, normal S1, S2, no murmurs, rubs, clicks or gallops Abdomen - gravid, non tender Back exam - full range of motion, no tenderness, palpable spasm or pain on motion, tenderness noted para spinal muscles, lumbar spine Neurological - alert, oriented, normal speech, no focal findings or movement disorder noted, screening mental status exam normal, DTR's normal and symmetric Extremities - peripheral pulses normal, no pedal edema, no clubbing or cyanosis Focused Gynecological Exam:  FHR: 115, mod variability, 15x15 accels, no decels Toco: no contractions  Labs: Recent Results (from the past 24 hour(s))  URINALYSIS, ROUTINE W REFLEX MICROSCOPIC  Collection Time   06/11/11 10:05 PM      Component Value Range   Color, Urine YELLOW  YELLOW    APPearance CLEAR  CLEAR    Specific Gravity, Urine 1.010  1.005 - 1.030    pH 7.5  5.0 - 8.0    Glucose, UA NEGATIVE  NEGATIVE (mg/dL)   Hgb urine dipstick NEGATIVE  NEGATIVE    Bilirubin Urine NEGATIVE  NEGATIVE    Ketones, ur NEGATIVE  NEGATIVE (mg/dL)   Protein, ur NEGATIVE  NEGATIVE (mg/dL)   Urobilinogen, UA 0.2  0.0 - 1.0 (mg/dL)   Nitrite NEGATIVE  NEGATIVE    Leukocytes, UA NEGATIVE  NEGATIVE   CBC   Collection Time   06/11/11 10:34 PM       Component Value Range   WBC 10.3  4.0 - 10.5 (K/uL)   RBC 3.48 (*) 3.87 - 5.11 (MIL/uL)   Hemoglobin 10.5 (*) 12.0 - 15.0 (g/dL)   HCT 78.2 (*) 95.6 - 46.0 (%)   MCV 90.8  78.0 - 100.0 (fL)   MCH 30.2  26.0 - 34.0 (pg)   MCHC 33.2  30.0 - 36.0 (g/dL)   RDW 21.3  08.6 - 57.8 (%)   Platelets 281  150 - 400 (K/uL)  COMPREHENSIVE METABOLIC PANEL   Collection Time   06/11/11 10:34 PM      Component Value Range   Sodium 136  135 - 145 (mEq/L)   Potassium 3.1 (*) 3.5 - 5.1 (mEq/L)   Chloride 102  96 - 112 (mEq/L)   CO2 23  19 - 32 (mEq/L)   Glucose, Bld 91  70 - 99 (mg/dL)   BUN 6  6 - 23 (mg/dL)   Creatinine, Ser 4.69  0.50 - 1.10 (mg/dL)   Calcium 9.1  8.4 - 62.9 (mg/dL)   Total Protein 6.0  6.0 - 8.3 (g/dL)   Albumin 2.8 (*) 3.5 - 5.2 (g/dL)   AST 14  0 - 37 (U/L)   ALT 11  0 - 35 (U/L)   Alkaline Phosphatase 99  39 - 117 (U/L)   Total Bilirubin 0.3  0.3 - 1.2 (mg/dL)   GFR calc non Af Amer >90  >90 (mL/min)   GFR calc Af Amer >90  >90 (mL/min)   Assessment: Gestational HTN Back pain  Plan: Discharge home Pre-x precautions FU tomorrow as scheduled at [facility]. 06/11/2011,11:14 PM

## 2011-06-12 ENCOUNTER — Telehealth (HOSPITAL_COMMUNITY): Payer: Self-pay | Admitting: *Deleted

## 2011-06-12 ENCOUNTER — Encounter (HOSPITAL_COMMUNITY): Payer: Self-pay | Admitting: *Deleted

## 2011-06-12 NOTE — Telephone Encounter (Signed)
Preadmission screen  

## 2011-06-13 ENCOUNTER — Inpatient Hospital Stay (HOSPITAL_COMMUNITY)
Admission: AD | Admit: 2011-06-13 | Discharge: 2011-06-15 | DRG: 775 | Disposition: A | Discharge status: Home / Self Care | Payer: Non-veteran care | Source: Ambulatory Visit | Attending: Obstetrics & Gynecology | Admitting: Obstetrics & Gynecology

## 2011-06-13 ENCOUNTER — Encounter (HOSPITAL_COMMUNITY): Payer: Self-pay

## 2011-06-13 DIAGNOSIS — O139 Gestational [pregnancy-induced] hypertension without significant proteinuria, unspecified trimester: Principal | ICD-10-CM | POA: Diagnosis present

## 2011-06-13 DIAGNOSIS — K529 Noninfective gastroenteritis and colitis, unspecified: Secondary | ICD-10-CM

## 2011-06-13 LAB — URINALYSIS, ROUTINE W REFLEX MICROSCOPIC
Bilirubin Urine: NEGATIVE
Glucose, UA: NEGATIVE mg/dL
Ketones, ur: 40 mg/dL — AB
Nitrite: NEGATIVE
Protein, ur: NEGATIVE mg/dL
Specific Gravity, Urine: 1.02 (ref 1.005–1.030)
Urobilinogen, UA: 0.2 mg/dL (ref 0.0–1.0)
pH: 6.5 (ref 5.0–8.0)

## 2011-06-13 LAB — CBC
HCT: 31.9 % — ABNORMAL LOW (ref 36.0–46.0)
Hemoglobin: 10.6 g/dL — ABNORMAL LOW (ref 12.0–15.0)
MCH: 30.2 pg (ref 26.0–34.0)
MCHC: 33.2 g/dL (ref 30.0–36.0)
MCV: 90.9 fL (ref 78.0–100.0)
Platelets: 256 10*3/uL (ref 150–400)
RBC: 3.51 MIL/uL — ABNORMAL LOW (ref 3.87–5.11)
RDW: 13.7 % (ref 11.5–15.5)
WBC: 15.1 10*3/uL — ABNORMAL HIGH (ref 4.0–10.5)

## 2011-06-13 LAB — URINE MICROSCOPIC-ADD ON

## 2011-06-13 MED ORDER — TRANEXAMIC ACID 650 MG PO TABS
650.0000 mg | ORAL_TABLET | Freq: Once | ORAL | Status: DC
  Filled 2011-06-13: qty 1

## 2011-06-13 MED ORDER — CITRIC ACID-SODIUM CITRATE 334-500 MG/5ML PO SOLN: 30.0000 mL | ORAL | Status: DC | PRN

## 2011-06-13 MED ORDER — OXYTOCIN 20 UNITS IN LACTATED RINGERS INFUSION - SIMPLE
125.0000 mL/h | Freq: Once | INTRAVENOUS | Status: DC
  Filled 2011-06-13: qty 1000

## 2011-06-13 MED ORDER — LIDOCAINE HCL (PF) 1 % IJ SOLN
30.0000 mL | INTRAMUSCULAR | Status: DC | PRN
  Filled 2011-06-13: qty 30

## 2011-06-13 MED ORDER — POTASSIUM CHLORIDE CRYS ER 20 MEQ PO TBCR
40.0000 meq | EXTENDED_RELEASE_TABLET | Freq: Once | ORAL | Status: AC
  Administered 2011-06-13: 40 meq via ORAL
  Filled 2011-06-13: qty 2

## 2011-06-13 MED ORDER — NALBUPHINE SYRINGE 5 MG/0.5 ML
5.0000 mg | INJECTION | INTRAMUSCULAR | Status: DC | PRN
  Administered 2011-06-13 – 2011-06-14 (×4): 5 mg via INTRAVENOUS
  Filled 2011-06-13 (×4): qty 0.5

## 2011-06-13 MED ORDER — ONDANSETRON HCL 4 MG/2ML IJ SOLN: 4.0000 mg | Freq: Four times a day (QID) | INTRAMUSCULAR | Status: DC | PRN

## 2011-06-13 MED ORDER — ZOLPIDEM TARTRATE 10 MG PO TABS
10.0000 mg | ORAL_TABLET | Freq: Every evening | ORAL | Status: DC | PRN
  Administered 2011-06-13: 10 mg via ORAL
  Filled 2011-06-13: qty 1

## 2011-06-13 MED ORDER — ACETAMINOPHEN 325 MG PO TABS
650.0000 mg | ORAL_TABLET | ORAL | Status: DC | PRN
Start: 2011-06-13 — End: 2011-06-14

## 2011-06-13 MED ORDER — ACETAMINOPHEN 325 MG PO TABS
650.0000 mg | ORAL_TABLET | Freq: Once | ORAL | Status: AC
  Administered 2011-06-13: 650 mg via ORAL
  Filled 2011-06-13: qty 2

## 2011-06-13 MED ORDER — FLUOXETINE HCL 20 MG PO CAPS
20.0000 mg | ORAL_CAPSULE | Freq: Every day | ORAL | Status: DC
  Administered 2011-06-13 – 2011-06-14 (×2): 20 mg via ORAL
  Filled 2011-06-13 (×2): qty 1

## 2011-06-13 MED ORDER — IBUPROFEN 600 MG PO TABS: 600.0000 mg | ORAL_TABLET | Freq: Four times a day (QID) | ORAL | Status: DC | PRN

## 2011-06-13 MED ORDER — OXYCODONE-ACETAMINOPHEN 5-325 MG PO TABS
1.0000 | ORAL_TABLET | ORAL | Status: DC | PRN
  Administered 2011-06-14: 1 via ORAL
  Filled 2011-06-13: qty 1

## 2011-06-13 MED ORDER — OXYTOCIN BOLUS FROM INFUSION
500.0000 mL | Freq: Once | INTRAVENOUS | Status: AC
  Administered 2011-06-14: 500 mL via INTRAVENOUS
  Filled 2011-06-13: qty 500

## 2011-06-13 MED ORDER — FLEET ENEMA 7-19 GM/118ML RE ENEM: 1.0000 | ENEMA | RECTAL | Status: DC | PRN

## 2011-06-13 MED ORDER — NALBUPHINE SYRINGE 5 MG/0.5 ML
5.0000 mg | INJECTION | Freq: Once | INTRAMUSCULAR | Status: AC
  Administered 2011-06-13: 5 mg via INTRAVENOUS
  Filled 2011-06-13: qty 0.5

## 2011-06-13 MED ORDER — LACTATED RINGERS IV BOLUS (SEPSIS)
1000.0000 mL | Freq: Once | INTRAVENOUS | Status: AC
  Administered 2011-06-13: 1000 mL via INTRAVENOUS

## 2011-06-13 MED ORDER — LACTATED RINGERS IV SOLN
500.0000 mL | INTRAVENOUS | Status: DC | PRN
  Administered 2011-06-14: 300 mL via INTRAVENOUS

## 2011-06-13 MED ORDER — LACTATED RINGERS IV SOLN
INTRAVENOUS | Status: DC
  Administered 2011-06-13 – 2011-06-14 (×3): via INTRAVENOUS

## 2011-06-13 NOTE — Progress Notes (Signed)
Dr Konrad Dolores notified of patient complains, bp reading, tracing, ctx pattern and sve result. Will see patient in mau.

## 2011-06-13 NOTE — H&P (Signed)
Marcia Hood is a 31 y.o. female presenting for labor check as pt e/ painful contractions since 1300. Maternal Medical History:  Reason for admission: Reason for admission: contractions.  Reason for Admission:   nauseaContractions: Onset was 3-5 hours ago.   Frequency: regular.   Perceived severity is moderate.    Fetal activity: Perceived fetal activity is normal.   Last perceived fetal movement was within the past hour.    Prenatal complications: Pregnancy induced hypertension and a 2 vessel cord  Prenatal Complications - Diabetes: none.    OB History    Grav Para Term Preterm Abortions TAB SAB Ect Mult Living   2 1 1  0 0 0 0 0 0 1     Past Medical History  Diagnosis Date  . Hypertension   . Depression   . Anxiety    Past Surgical History  Procedure Date  . No past surgeries    Family History: family history is negative for Anesthesia problems. Social History:  reports that she has been smoking Cigarettes.  She does not have any smokeless tobacco history on file. She reports that she does not drink alcohol or use illicit drugs.  Review of Systems  Constitutional: Negative for fever.  Eyes: Negative for blurred vision.  Respiratory: Negative for sputum production.   Gastrointestinal: Negative for heartburn, nausea, vomiting, diarrhea, constipation and blood in stool.  Genitourinary: Negative for dysuria, urgency, frequency and hematuria.  Skin: Negative for rash.  Neurological: Negative for sensory change and headaches.  Denies vaginal bleeding  Dilation: 1.5 Effacement (%): 70 Station: -2 Exam by:: Peace, rn  Blood pressure 136/95, pulse 86, temperature 98.2 F (36.8 C), temperature source Oral, resp. rate 18, height 5' 4.5" (1.638 m), weight 78.109 kg (172 lb 3.2 oz), SpO2 100.00%.  Maternal Exam:  Uterine Assessment: Contraction strength is moderate.  Contraction frequency is irregular.  Contractions every 4-109min  Abdomen: Fundal height is Term.     Introitus: Ferning test: not done.  Nitrazine test: not done.     Physical Exam  Constitutional: She is oriented to person, place, and time. She appears well-developed and well-nourished.  Neck: Normal range of motion.  Cardiovascular: Normal rate and regular rhythm.   Respiratory: Effort normal.  GI: Soft. There is no tenderness. There is no rebound and no guarding.  Musculoskeletal: Normal range of motion.  Neurological: She is alert and oriented to person, place, and time.  Skin: Skin is warm and dry.    Prenatal labs: ABO, Rh: A/Positive/-- (01/17 0000) Antibody:   Rubella: Immune (01/17 0000) RPR: Nonreactive (12/20 0000)  HBsAg: Negative (01/17 0000)  HIV: Non-reactive (01/17 0000)  GBS: Negative (04/30 0000)   Assessment/Plan: 31 G2P1001 [redacted]w[redacted]d beign admitted for IOL due to PIH. H/o 2 vessel cord. Recent labs reviewed. Kdur given x1 in MAU.  - Urine dip. Pending - Admit to L&D - continuous monitoring - IV pain medications - Foley bulb to be placed   Marcia Ballin, MD 06/13/2011, 6:36 PM

## 2011-06-13 NOTE — MAU Note (Signed)
Patient states she has had an increase in her discharge and having contractions. Reports no bleeding and good fetal movement.

## 2011-06-13 NOTE — MAU Note (Signed)
Patient is in for labor eval. She states that the contractions are not frequent but she is having painful intermittent lower back pain. She denies vaginal bleeding or lof. Reports mucus discharge and good fetal movement. She states that her induction is tomorrow at 7am due to 2 vessel cord.

## 2011-06-13 NOTE — H&P (Signed)
Attestation of Attending Supervision of Resident: Evaluation and management procedures were performed by the [facility] Medicine Resident under my supervision.  I have reviewed the resident's note and chart, and I agree with management and plan.  Jaynie Collins, M.D. 06/13/2011 7:36 PM

## 2011-06-13 NOTE — Progress Notes (Signed)
Marcia Hood is a 31 y.o. G2P1001 at [redacted]w[redacted]d  Subjective: Having some mild back pain; no other complaints  Objective: BP 139/99  Pulse 90  Temp(Src) 98.6 F (37 C) (Oral)  Resp 20  Ht 5\' 5"  (1.651 m)  Wt 78.019 kg (172 lb)  BMI 28.62 kg/m2  SpO2 100%      FHT:  FHR: 125 bpm, variability: moderate,  accelerations:  Present,  decelerations:  Absent UC:   Rare, mild SVE:   Dilation: 2 Effacement (%): 70 Station: -2 Exam by:: Marcia Hood foley bulb inserted into cx and inflated with 60cc; no difficulty  Labs: Lab Results  Component Value Date   WBC 15.1* 06/13/2011   HGB 10.6* 06/13/2011   HCT 31.9* 06/13/2011   MCV 90.9 06/13/2011   PLT 256 06/13/2011    Assessment / Plan: IOL process Gest HTN- BPs stable  Allow foley bulb to come out and then begin Pitocin UA pending  Marcia Hood 06/13/2011, 8:59 PM

## 2011-06-14 ENCOUNTER — Inpatient Hospital Stay (HOSPITAL_COMMUNITY): Admit: 2011-06-14 | Payer: Non-veteran care

## 2011-06-14 ENCOUNTER — Inpatient Hospital Stay (HOSPITAL_COMMUNITY): Payer: Non-veteran care | Admitting: Anesthesiology

## 2011-06-14 ENCOUNTER — Encounter (HOSPITAL_COMMUNITY): Payer: Self-pay

## 2011-06-14 ENCOUNTER — Encounter (HOSPITAL_COMMUNITY): Payer: Self-pay | Admitting: Anesthesiology

## 2011-06-14 DIAGNOSIS — O139 Gestational [pregnancy-induced] hypertension without significant proteinuria, unspecified trimester: Secondary | ICD-10-CM

## 2011-06-14 LAB — RPR: RPR Ser Ql: NONREACTIVE

## 2011-06-14 MED ORDER — WITCH HAZEL-GLYCERIN EX PADS: 1.0000 "application " | MEDICATED_PAD | CUTANEOUS | Status: DC | PRN

## 2011-06-14 MED ORDER — FENTANYL CITRATE 0.05 MG/ML IJ SOLN
100.0000 ug | INTRAMUSCULAR | Status: DC | PRN
  Administered 2011-06-14 (×2): 100 ug via INTRAVENOUS
  Filled 2011-06-14 (×2): qty 2

## 2011-06-14 MED ORDER — OXYCODONE-ACETAMINOPHEN 5-325 MG PO TABS
1.0000 | ORAL_TABLET | ORAL | Status: DC | PRN
  Administered 2011-06-14: 1 via ORAL
  Administered 2011-06-14: 2 via ORAL
  Administered 2011-06-15: 1 via ORAL
  Administered 2011-06-15: 2 via ORAL
  Administered 2011-06-15: 1 via ORAL
  Administered 2011-06-15 (×2): 2 via ORAL
  Filled 2011-06-14: qty 2
  Filled 2011-06-14 (×2): qty 1
  Filled 2011-06-14: qty 2
  Filled 2011-06-14: qty 1
  Filled 2011-06-14 (×2): qty 2

## 2011-06-14 MED ORDER — SENNOSIDES-DOCUSATE SODIUM 8.6-50 MG PO TABS
2.0000 | ORAL_TABLET | Freq: Every day | ORAL | Status: DC
  Administered 2011-06-14: 2 via ORAL

## 2011-06-14 MED ORDER — ONDANSETRON HCL 4 MG PO TABS: 4.0000 mg | ORAL_TABLET | ORAL | Status: DC | PRN

## 2011-06-14 MED ORDER — ZOLPIDEM TARTRATE 5 MG PO TABS
5.0000 mg | ORAL_TABLET | Freq: Every evening | ORAL | Status: DC | PRN
  Administered 2011-06-14: 5 mg via ORAL
  Filled 2011-06-14: qty 1

## 2011-06-14 MED ORDER — EPHEDRINE 5 MG/ML INJ: 10.0000 mg | INTRAVENOUS | Status: DC | PRN

## 2011-06-14 MED ORDER — OXYTOCIN 20 UNITS IN LACTATED RINGERS INFUSION - SIMPLE
1.0000 m[IU]/min | INTRAVENOUS | Status: DC
  Administered 2011-06-14: 2 m[IU]/min via INTRAVENOUS

## 2011-06-14 MED ORDER — LANOLIN HYDROUS EX OINT: TOPICAL_OINTMENT | CUTANEOUS | Status: DC | PRN

## 2011-06-14 MED ORDER — TERBUTALINE SULFATE 1 MG/ML IJ SOLN: 0.2500 mg | Freq: Once | INTRAMUSCULAR | Status: DC | PRN

## 2011-06-14 MED ORDER — FENTANYL 2.5 MCG/ML BUPIVACAINE 1/10 % EPIDURAL INFUSION (WH - ANES)
INTRAMUSCULAR | Status: DC | PRN
  Administered 2011-06-14: 14 mL/h via EPIDURAL

## 2011-06-14 MED ORDER — TETANUS-DIPHTH-ACELL PERTUSSIS 5-2.5-18.5 LF-MCG/0.5 IM SUSP
0.5000 mL | Freq: Once | INTRAMUSCULAR | Status: AC
  Administered 2011-06-15: 0.5 mL via INTRAMUSCULAR
  Filled 2011-06-14: qty 0.5

## 2011-06-14 MED ORDER — LACTATED RINGERS IV SOLN: 500.0000 mL | Freq: Once | INTRAVENOUS | Status: DC

## 2011-06-14 MED ORDER — LIDOCAINE HCL (PF) 1 % IJ SOLN
INTRAMUSCULAR | Status: DC | PRN
  Administered 2011-06-14 (×2): 4 mL

## 2011-06-14 MED ORDER — BENZOCAINE-MENTHOL 20-0.5 % EX AERO
1.0000 "application " | INHALATION_SPRAY | CUTANEOUS | Status: DC | PRN
  Administered 2011-06-14: 1 via TOPICAL
  Filled 2011-06-14: qty 56

## 2011-06-14 MED ORDER — FENTANYL 2.5 MCG/ML BUPIVACAINE 1/10 % EPIDURAL INFUSION (WH - ANES)
14.0000 mL/h | INTRAMUSCULAR | Status: DC
  Administered 2011-06-14: 14 mL/h via EPIDURAL
  Filled 2011-06-14 (×2): qty 60

## 2011-06-14 MED ORDER — PRENATAL MULTIVITAMIN CH
1.0000 | ORAL_TABLET | Freq: Every day | ORAL | Status: DC
  Administered 2011-06-15: 1 via ORAL
  Filled 2011-06-14: qty 1

## 2011-06-14 MED ORDER — SIMETHICONE 80 MG PO CHEW: 80.0000 mg | CHEWABLE_TABLET | ORAL | Status: DC | PRN

## 2011-06-14 MED ORDER — DIPHENHYDRAMINE HCL 25 MG PO CAPS: 25.0000 mg | ORAL_CAPSULE | Freq: Four times a day (QID) | ORAL | Status: DC | PRN

## 2011-06-14 MED ORDER — PHENYLEPHRINE 40 MCG/ML (10ML) SYRINGE FOR IV PUSH (FOR BLOOD PRESSURE SUPPORT): 80.0000 ug | PREFILLED_SYRINGE | INTRAVENOUS | Status: DC | PRN

## 2011-06-14 MED ORDER — EPHEDRINE 5 MG/ML INJ
10.0000 mg | INTRAVENOUS | Status: DC | PRN
  Filled 2011-06-14: qty 4

## 2011-06-14 MED ORDER — IBUPROFEN 600 MG PO TABS
600.0000 mg | ORAL_TABLET | Freq: Four times a day (QID) | ORAL | Status: DC
  Administered 2011-06-14 – 2011-06-15 (×5): 600 mg via ORAL
  Filled 2011-06-14 (×4): qty 1

## 2011-06-14 MED ORDER — DIBUCAINE 1 % RE OINT: 1.0000 "application " | TOPICAL_OINTMENT | RECTAL | Status: DC | PRN

## 2011-06-14 MED ORDER — ONDANSETRON HCL 4 MG/2ML IJ SOLN: 4.0000 mg | INTRAMUSCULAR | Status: DC | PRN

## 2011-06-14 MED ORDER — DIPHENHYDRAMINE HCL 50 MG/ML IJ SOLN: 12.5000 mg | INTRAMUSCULAR | Status: DC | PRN

## 2011-06-14 MED ORDER — PHENYLEPHRINE 40 MCG/ML (10ML) SYRINGE FOR IV PUSH (FOR BLOOD PRESSURE SUPPORT)
80.0000 ug | PREFILLED_SYRINGE | INTRAVENOUS | Status: DC | PRN
  Filled 2011-06-14: qty 5

## 2011-06-14 NOTE — Anesthesia Procedure Notes (Signed)
Epidural Patient location during procedure: OB Start time: 06/14/2011 6:29 AM  Staffing Anesthesiologist: Evella Kasal A. Performed by: anesthesiologist   Preanesthetic Checklist Completed: patient identified, site marked, surgical consent, pre-op evaluation, timeout performed, IV checked, risks and benefits discussed and monitors and equipment checked  Epidural Patient position: sitting Prep: site prepped and draped and DuraPrep Patient monitoring: continuous pulse ox and blood pressure Approach: midline Injection technique: LOR air  Needle:  Needle type: Tuohy  Needle gauge: 17 G Needle length: 9 cm Needle insertion depth: 5 cm cm Catheter type: closed end flexible Catheter size: 19 Gauge Catheter at skin depth: 10 cm Test dose: negative and Other  Assessment Events: blood not aspirated, injection not painful, no injection resistance, negative IV test and no paresthesia  Additional Notes Patient identified. Risks and benefits discussed including failed block, incomplete  Pain control, post dural puncture headache, nerve damage, paralysis, blood pressure Changes, nausea, vomiting, reactions to medications-both toxic and allergic and post Partum back pain. All questions were answered. Patient expressed understanding and wished to proceed. Sterile technique was used throughout procedure. Epidural site was Dressed with sterile barrier dressing. No paresthesias, signs of intravascular injection Or signs of intrathecal spread were encountered.  Patient was more comfortable after the epidural was dosed. Please see RN's note for documentation of vital signs and FHR which are stable.

## 2011-06-14 NOTE — Anesthesia Preprocedure Evaluation (Addendum)
Anesthesia Evaluation  Patient identified by MRN, date of birth, ID band Patient awake    Reviewed: Allergy & Precautions, H&P , Patient's Chart, lab work & pertinent test results  Airway Mallampati: II TM Distance: >3 FB     Dental No notable dental hx. (+) Teeth Intact   Pulmonary neg pulmonary ROS,  breath sounds clear to auscultation  Pulmonary exam normal       Cardiovascular hypertension, Rhythm:Regular Rate:Normal     Neuro/Psych Anxiety Depression negative neurological ROS     GI/Hepatic Neg liver ROS, GERD-  Medicated and Controlled,  Endo/Other  negative endocrine ROS  Renal/GU negative Renal ROS  negative genitourinary   Musculoskeletal   Abdominal   Peds  Hematology negative hematology ROS (+)   Anesthesia Other Findings Pierced tongue  Reproductive/Obstetrics (+) Pregnancy                          Anesthesia Physical Anesthesia Plan  ASA: II  Anesthesia Plan: Epidural   Post-op Pain Management:    Induction:   Airway Management Planned:   Additional Equipment:   Intra-op Plan:   Post-operative Plan:   Informed Consent: I have reviewed the patients History and Physical, chart, labs and discussed the procedure including the risks, benefits and alternatives for the proposed anesthesia with the patient or authorized representative who has indicated his/her understanding and acceptance.     Plan Discussed with: Anesthesiologist and Surgeon  Anesthesia Plan Comments:         Anesthesia Quick Evaluation

## 2011-06-14 NOTE — Progress Notes (Signed)
Marcia Hood is a 31 y.o. G2P1001 at [redacted]w[redacted]d   Subjective: Fentanyl worked, but now with increase pain again  Objective: BP 147/98  Pulse 74  Temp(Src) 97.6 F (36.4 C) (Oral)  Resp 18  Ht 5\' 5"  (1.651 m)  Wt 78.019 kg (172 lb)  BMI 28.62 kg/m2  SpO2 92%      FHT:  FHR: 120 bpm, variability: moderate,  accelerations:  Present,  decelerations:  Absentocc mi variables UC:   regular, every 3 minutes SVE:   Dilation: 5.5 Effacement (%): 80 Station: -1 Exam by:: k. Anav Lammert cnm foley bulb removed from vag vault  Labs: Lab Results  Component Value Date   WBC 15.1* 06/13/2011   HGB 10.6* 06/13/2011   HCT 31.9* 06/13/2011   MCV 90.9 06/13/2011   PLT 256 06/13/2011    Assessment / Plan: IUP at term Gest HTN Active labor  Will give one more dose of Fentanyl and then plan for epidural   Cam Hai 06/14/2011, 6:27 AM

## 2011-06-14 NOTE — Progress Notes (Signed)
Marcia Hood is a 31 y.o. G2P1001 at [redacted]w[redacted]d   Subjective: Received 5mg  Nubain multiple times; requesting something stronger  Objective: BP 159/95  Pulse 80  Temp(Src) 98 F (36.7 C) (Oral)  Resp 18  Ht 5\' 5"  (1.651 m)  Wt 78.019 kg (172 lb)  BMI 28.62 kg/m2  SpO2 100%      FHT:  FHR: 120 bpm, variability: minimal ,  accelerations:  Abscent,  decelerations:  Absent having some 10x10 accels  UC:   regular, every 3 minutes SVE:   Dilation: 2 Effacement (%): 70 Station: -2 Exam by:: k. Akansha Wyche cnm (not reexamined, but foley still in place)  Labs: Lab Results  Component Value Date   WBC 15.1* 06/13/2011   HGB 10.6* 06/13/2011   HCT 31.9* 06/13/2011   MCV 90.9 06/13/2011   PLT 256 06/13/2011    Assessment / Plan: IOL process Foley bulb in place Painful ctx  Will try Fentanyl q 1hr, and resort to epidural at this stage only if absolutely necessary.   Cam Hai 06/14/2011, 4:35 AM

## 2011-06-14 NOTE — Anesthesia Postprocedure Evaluation (Signed)
  Anesthesia Post-op Note  Patient: Marcia Hood  Procedure(s) Performed: * No procedures listed *  Patient Location: PACU  Anesthesia Type: Epidural  Level of Consciousness: awake, alert  and oriented  Airway and Oxygen Therapy: Patient Spontanous Breathing  Post-op Pain: none  Post-op Assessment: Post-op Vital signs reviewed and Patient's Cardiovascular Status Stable  Post-op Vital Signs: Reviewed and stable  Complications: No apparent anesthesia complications

## 2011-06-15 ENCOUNTER — Inpatient Hospital Stay (HOSPITAL_COMMUNITY): Admission: RE | Admit: 2011-06-15 | Payer: Non-veteran care | Source: Ambulatory Visit

## 2011-06-15 LAB — CBC
HCT: 31.4 % — ABNORMAL LOW (ref 36.0–46.0)
Hemoglobin: 10.3 g/dL — ABNORMAL LOW (ref 12.0–15.0)
MCH: 30.2 pg (ref 26.0–34.0)
MCHC: 32.8 g/dL (ref 30.0–36.0)
MCV: 92.1 fL (ref 78.0–100.0)
Platelets: 239 10*3/uL (ref 150–400)
RBC: 3.41 MIL/uL — ABNORMAL LOW (ref 3.87–5.11)
RDW: 13.9 % (ref 11.5–15.5)
WBC: 18.2 10*3/uL — ABNORMAL HIGH (ref 4.0–10.5)

## 2011-06-15 MED ORDER — ACETAMINOPHEN-CODEINE #3 300-30 MG PO TABS: 1.0000 | ORAL_TABLET | ORAL | Status: AC | PRN

## 2011-06-15 MED ORDER — IBUPROFEN 200 MG PO TABS: 400.0000 mg | ORAL_TABLET | Freq: Four times a day (QID) | ORAL | Status: AC | PRN

## 2011-06-15 NOTE — Discharge Summary (Signed)
Obstetric Discharge Summary Reason for Admission: onset of labor Prenatal Procedures: none Intrapartum Procedures: spontaneous vaginal delivery Postpartum Procedures: none Complications-Operative and Postpartum: none Hemoglobin  Date Value Range Status  06/15/2011 10.3* 12.0-15.0 (g/dL) Final     HCT  Date Value Range Status  06/15/2011 31.4* 36.0-46.0 (%) Final    Physical Exam:  General: alert, cooperative, appears stated age and no distress Lochia: appropriate Uterine Fundus: firm DVT Evaluation: No evidence of DVT seen on physical exam. Negative Homan's sign. No cords or calf tenderness. No significant calf/ankle edema.  Discharge Diagnoses: Term Pregnancy-delivered  Discharge Information: Date: 06/15/2011 Activity: pelvic rest Diet: routine Medications: Tylenol #3 and Ibuprofen Condition: stable Instructions: refer to practice specific booklet Discharge to: home   Newborn Data: Live born female  Birth Weight: 7 lb 13.2 oz (3549 g) APGAR: 7, 9  Home with mother.  Andrena Mews, DO [facility] Resident - PGY-1 06/15/2011 5:24 PM

## 2011-06-15 NOTE — Progress Notes (Signed)
UR Chart review completed.  

## 2011-06-15 NOTE — Progress Notes (Signed)
Post Partum Day #1 Subjective: no complaints, up ad lib, voiding, tolerating PO and + flatus Denies lightheadedness, LE edema, or calf tenderness.   Would like circumcision.  Breast feeding.  No plans for contraception at this time.    Objective: Blood pressure 146/96, pulse 68, temperature 98.1 F (36.7 C), temperature source Oral, resp. rate 20, height 5\' 5"  (1.651 m), weight 78.019 kg (172 lb), SpO2 97.00%, unknown if currently breastfeeding. 2 prior BP readings: 116/72, 132/82  Physical Exam:  General: alert, cooperative and no distress Lochia: appropriate Uterine Fundus: firm DVT Evaluation: No evidence of DVT seen on physical exam.  No calf tenderness. No significant calf/ankle edema.   Basename 06/15/11 0530 06/13/11 2000  HGB 10.3* 10.6*  HCT 31.4* 31.9*    Assessment/Plan: Plan for discharge tomorrow and Circumcision prior to discharge   LOS: 2 days   Bedelia Person, PA-S 06/15/2011, 7:41 AM

## 2011-06-15 NOTE — Progress Notes (Signed)
Patient was referred for history of depression/anxiety. * Referral screened out by Clinical Social Worker because none of the following criteria appear to apply:  ~ History of anxiety/depression during this pregnancy, or of post-partum depression.  ~ Diagnosis of anxiety and/or depression within last 3 years  ~ History of depression due to pregnancy loss/loss of child  OR * Patient's symptoms currently being treated with medication and/or therapy.  Please contact the Clinical Social Worker if needs arise, or by the patient's request. Pt has a prescription for Prozac and is currently under the care of physicians at [facility], as per pt.

## 2011-06-16 NOTE — Discharge Summary (Signed)
Agree with above note. Patient was seen and examined by me.  Marcia Hood 06/16/2011 9:18 AM

## 2011-06-16 NOTE — Progress Notes (Signed)
Patient seen and examined.  Agree with above note.  Candelaria Celeste JEHIEL 06/16/2011 10:46 AM

## 2011-06-26 NOTE — MAU Provider Note (Signed)
Attestation of Attending Supervision of Advanced Practitioner: Evaluation and management procedures were performed by the PA/NP/CNM/OB Fellow under my supervision/collaboration. Chart reviewed and agree with management and plan.  Cosandra Plouffe V 06/26/2011 8:59 PM

## 2011-10-04 ENCOUNTER — Encounter (HOSPITAL_COMMUNITY): Payer: Self-pay

## 2012-03-09 ENCOUNTER — Emergency Department (HOSPITAL_COMMUNITY)
Admission: EM | Admit: 2012-03-09 | Discharge: 2012-03-09 | Disposition: A | Discharge status: Home / Self Care | Payer: BC Managed Care – PPO | Attending: Emergency Medicine | Admitting: Emergency Medicine

## 2012-03-09 ENCOUNTER — Encounter (HOSPITAL_COMMUNITY): Payer: Self-pay

## 2012-03-09 DIAGNOSIS — O9989 Other specified diseases and conditions complicating pregnancy, childbirth and the puerperium: Secondary | ICD-10-CM | POA: Insufficient documentation

## 2012-03-09 DIAGNOSIS — F411 Generalized anxiety disorder: Secondary | ICD-10-CM | POA: Insufficient documentation

## 2012-03-09 DIAGNOSIS — Z87891 Personal history of nicotine dependence: Secondary | ICD-10-CM | POA: Insufficient documentation

## 2012-03-09 DIAGNOSIS — R51 Headache: Secondary | ICD-10-CM

## 2012-03-09 DIAGNOSIS — Z79899 Other long term (current) drug therapy: Secondary | ICD-10-CM | POA: Insufficient documentation

## 2012-03-09 DIAGNOSIS — Z8679 Personal history of other diseases of the circulatory system: Secondary | ICD-10-CM | POA: Insufficient documentation

## 2012-03-09 DIAGNOSIS — Z8739 Personal history of other diseases of the musculoskeletal system and connective tissue: Secondary | ICD-10-CM | POA: Insufficient documentation

## 2012-03-09 DIAGNOSIS — F329 Major depressive disorder, single episode, unspecified: Secondary | ICD-10-CM | POA: Insufficient documentation

## 2012-03-09 DIAGNOSIS — F3289 Other specified depressive episodes: Secondary | ICD-10-CM | POA: Insufficient documentation

## 2012-03-09 DIAGNOSIS — I10 Essential (primary) hypertension: Secondary | ICD-10-CM | POA: Insufficient documentation

## 2012-03-09 HISTORY — DX: Dorsalgia, unspecified: M54.9

## 2012-03-09 HISTORY — DX: Migraine, unspecified, not intractable, without status migrainosus: G43.909

## 2012-03-09 HISTORY — DX: Other chronic pain: G89.29

## 2012-03-09 MED ORDER — HYDROMORPHONE HCL PF 1 MG/ML IJ SOLN
1.0000 mg | Freq: Once | INTRAMUSCULAR | Status: AC
  Administered 2012-03-09: 1 mg via INTRAVENOUS
  Filled 2012-03-09: qty 1

## 2012-03-09 MED ORDER — HYDROMORPHONE HCL PF 1 MG/ML IJ SOLN
0.5000 mg | Freq: Once | INTRAMUSCULAR | Status: AC
  Administered 2012-03-09: 0.5 mg via INTRAVENOUS
  Filled 2012-03-09: qty 1

## 2012-03-09 MED ORDER — SODIUM CHLORIDE 0.9 % IV BOLUS (SEPSIS)
1000.0000 mL | Freq: Once | INTRAVENOUS | Status: AC
  Administered 2012-03-09: 1000 mL via INTRAVENOUS

## 2012-03-09 MED ORDER — PROMETHAZINE HCL 25 MG/ML IJ SOLN
25.0000 mg | Freq: Once | INTRAMUSCULAR | Status: AC
  Administered 2012-03-09: 25 mg via INTRAVENOUS
  Filled 2012-03-09: qty 1

## 2012-03-09 NOTE — ED Notes (Signed)
Pt reports being [redacted] weeks pregnant and started having a migraine last night, has not called her ob/gyn with her last pregnancy last year she came for fluids, phenergan and demerol.   Thinks shes needs again.

## 2012-03-09 NOTE — ED Provider Notes (Signed)
History     CSN: [id number]  Arrival date & time 03/09/12  1629   First MD Initiated Contact with Patient 03/09/12 1755      Chief Complaint  Patient presents with  . Migraine    (Consider location/radiation/quality/duration/timing/severity/associated sxs/prior treatment) Patient is a 32 y.o. female presenting with migraines. The history is provided by the patient (the pt complains of a headache and nauseau). No language interpreter was used.  Migraine This is a recurrent problem. The current episode started 6 to 12 hours ago. The problem occurs constantly. The problem has not changed since onset.Pertinent negatives include no chest pain, no abdominal pain and no headaches. Nothing aggravates the symptoms. Nothing relieves the symptoms.    Past Medical History  Diagnosis Date  . Hypertension   . Depression   . Anxiety   . Pregnant   . Migraine   . Chronic back pain     Past Surgical History  Procedure Laterality Date  . No past surgeries      Family History  Problem Relation Age of Onset  . Anesthesia problems Neg Hx     History  Substance Use Topics  . Smoking status: Former Smoker -- 0.50 packs/day for 10 years    Types: Cigarettes  . Smokeless tobacco: Not on file  . Alcohol Use: No    OB History   Grav Para Term Preterm Abortions TAB SAB Ect Mult Living   2 2 2  0 0 0 0 0 0 2      Review of Systems  Constitutional: Negative for fatigue.  HENT: Negative for congestion, sinus pressure and ear discharge.   Eyes: Negative for discharge.  Respiratory: Negative for cough.   Cardiovascular: Negative for chest pain.  Gastrointestinal: Negative for abdominal pain and diarrhea.  Genitourinary: Negative for frequency and hematuria.  Musculoskeletal: Negative for back pain.  Skin: Negative for rash.  Neurological: Negative for seizures and headaches.  Psychiatric/Behavioral: Negative for hallucinations.    Allergies  Penicillins  Home Medications    Current Outpatient Rx  Name  Route  Sig  Dispense  Refill  . calcium carbonate (TUMS - DOSED IN MG ELEMENTAL CALCIUM) 500 MG chewable tablet   Oral   Chew 1 tablet by mouth 2 (two) times daily as needed. For heartburn         . FLUoxetine (PROZAC) 20 MG capsule   Oral   Take 20 mg by mouth daily.         . Prenatal Vit-Fe Fumarate-FA (PRENATAL MULTIVITAMIN) TABS   Oral   Take 1 tablet by mouth daily.         Marland Kitchen rOPINIRole (REQUIP) 1 MG tablet   Oral   Take 1 mg by mouth at bedtime.         Marland Kitchen zolpidem (AMBIEN) 10 MG tablet   Oral   Take 10 mg by mouth at bedtime.           BP 136/69  Pulse 79  Temp(Src) 98.9 F (37.2 C) (Oral)  Resp 18  Ht 5\' 4"  (1.626 m)  Wt 172 lb (78.019 kg)  BMI 29.51 kg/m2  SpO2 100%  LMP 12/25/2011  Breastfeeding? No  Physical Exam  Constitutional: She is oriented to person, place, and time. She appears well-developed.  HENT:  Head: Normocephalic and atraumatic.  Eyes: Conjunctivae and EOM are normal. No scleral icterus.  Neck: Neck supple. No thyromegaly present.  Cardiovascular: Normal rate and regular rhythm.  Exam reveals no gallop and  no friction rub.   No murmur heard. Pulmonary/Chest: No stridor. She has no wheezes. She has no rales. She exhibits no tenderness.  Abdominal: She exhibits no distension. There is no tenderness. There is no rebound.  Musculoskeletal: Normal range of motion. She exhibits no edema.  Lymphadenopathy:    She has no cervical adenopathy.  Neurological: She is oriented to person, place, and time. Coordination normal.  Skin: No rash noted. No erythema.  Psychiatric: She has a normal mood and affect. Her behavior is normal.    ED Course  Procedures (including critical care time)  Labs Reviewed - No data to display No results found.   1. Headache       MDM          Benny Lennert, MD 03/09/12 1901

## 2012-03-27 ENCOUNTER — Encounter: Payer: Self-pay | Admitting: *Deleted

## 2012-04-18 ENCOUNTER — Ambulatory Visit (INDEPENDENT_AMBULATORY_CARE_PROVIDER_SITE_OTHER): Payer: Non-veteran care | Admitting: Obstetrics & Gynecology

## 2012-04-18 ENCOUNTER — Other Ambulatory Visit: Payer: Self-pay | Admitting: Obstetrics & Gynecology

## 2012-04-18 VITALS — BP 128/58 | Wt 149.0 lb

## 2012-04-18 DIAGNOSIS — Z3490 Encounter for supervision of normal pregnancy, unspecified, unspecified trimester: Secondary | ICD-10-CM

## 2012-04-18 DIAGNOSIS — F1121 Opioid dependence, in remission: Secondary | ICD-10-CM

## 2012-04-18 DIAGNOSIS — F1921 Other psychoactive substance dependence, in remission: Secondary | ICD-10-CM

## 2012-04-18 DIAGNOSIS — Z3480 Encounter for supervision of other normal pregnancy, unspecified trimester: Secondary | ICD-10-CM

## 2012-04-18 LAB — POCT URINALYSIS DIPSTICK
Blood, UA: NEGATIVE
Glucose, UA: NEGATIVE
Ketones, UA: NEGATIVE
Nitrite, UA: NEGATIVE
Protein, UA: NEGATIVE

## 2012-04-18 MED ORDER — ONDANSETRON HCL 8 MG PO TABS: 8.0000 mg | ORAL_TABLET | Freq: Three times a day (TID) | ORAL | Status: DC | PRN

## 2012-04-18 NOTE — Patient Instructions (Signed)
Pregnancy - Second Trimester The second trimester of pregnancy (3 to 6 months) is a period of rapid growth for you and your baby. At the end of the sixth month, your baby is about 9 inches long and weighs 1 1/2 pounds. You will begin to feel the baby move between 18 and 20 weeks of the pregnancy. This is called quickening. Weight gain is faster. A clear fluid (colostrum) may leak out of your breasts. You may feel small contractions of the womb (uterus). This is known as false labor or Braxton-Hicks contractions. This is like a practice for labor when the baby is ready to be born. Usually, the problems with morning sickness have usually passed by the end of your first trimester. Some women develop small dark blotches (called cholasma, mask of pregnancy) on their face that usually goes away after the baby is born. Exposure to the sun makes the blotches worse. Acne may also develop in some pregnant women and pregnant women who have acne, may find that it goes away. PRENATAL EXAMS  Blood work may continue to be done during prenatal exams. These tests are done to check on your health and the probable health of your baby. Blood work is used to follow your blood levels (hemoglobin). Anemia (low hemoglobin) is common during pregnancy. Iron and vitamins are given to help prevent this. You will also be checked for diabetes between 24 and 28 weeks of the pregnancy. Some of the previous blood tests may be repeated.  The size of the uterus is measured during each visit. This is to make sure that the baby is continuing to grow properly according to the dates of the pregnancy.  Your blood pressure is checked every prenatal visit. This is to make sure you are not getting toxemia.  Your urine is checked to make sure you do not have an infection, diabetes or protein in the urine.  Your weight is checked often to make sure gains are happening at the suggested rate. This is to ensure that both you and your baby are growing  normally.  Sometimes, an ultrasound is performed to confirm the proper growth and development of the baby. This is a test which bounces harmless sound waves off the baby so your caregiver can more accurately determine due dates. Sometimes, a specialized test is done on the amniotic fluid surrounding the baby. This test is called an amniocentesis. The amniotic fluid is obtained by sticking a needle into the belly (abdomen). This is done to check the chromosomes in instances where there is a concern about possible genetic problems with the baby. It is also sometimes done near the end of pregnancy if an early delivery is required. In this case, it is done to help make sure the baby's lungs are mature enough for the baby to live outside of the womb. CHANGES OCCURING IN THE SECOND TRIMESTER OF PREGNANCY Your body goes through many changes during pregnancy. They vary from person to person. Talk to your caregiver about changes you notice that you are concerned about.  During the second trimester, you will likely have an increase in your appetite. It is normal to have cravings for certain foods. This varies from person to person and pregnancy to pregnancy.  Your lower abdomen will begin to bulge.  You may have to urinate more often because the uterus and baby are pressing on your bladder. It is also common to get more bladder infections during pregnancy (pain with urination). You can help this by   drinking lots of fluids and emptying your bladder before and after intercourse.  You may begin to get stretch marks on your hips, abdomen, and breasts. These are normal changes in the body during pregnancy. There are no exercises or medications to take that prevent this change.  You may begin to develop swollen and bulging veins (varicose veins) in your legs. Wearing support hose, elevating your feet for 15 minutes, 3 to 4 times a day and limiting salt in your diet helps lessen the problem.  Heartburn may develop  as the uterus grows and pushes up against the stomach. Antacids recommended by your caregiver helps with this problem. Also, eating smaller meals 4 to 5 times a day helps.  Constipation can be treated with a stool softener or adding bulk to your diet. Drinking lots of fluids, vegetables, fruits, and whole grains are helpful.  Exercising is also helpful. If you have been very active up until your pregnancy, most of these activities can be continued during your pregnancy. If you have been less active, it is helpful to start an exercise program such as walking.  Hemorrhoids (varicose veins in the rectum) may develop at the end of the second trimester. Warm sitz baths and hemorrhoid cream recommended by your caregiver helps hemorrhoid problems.  Backaches may develop during this time of your pregnancy. Avoid heavy lifting, wear low heal shoes and practice good posture to help with backache problems.  Some pregnant women develop tingling and numbness of their hand and fingers because of swelling and tightening of ligaments in the wrist (carpel tunnel syndrome). This goes away after the baby is born.  As your breasts enlarge, you may have to get a bigger bra. Get a comfortable, cotton, support bra. Do not get a nursing bra until the last month of the pregnancy if you will be nursing the baby.  You may get a dark line from your belly button to the pubic area called the linea nigra.  You may develop rosy cheeks because of increase blood flow to the face.  You may develop spider looking lines of the face, neck, arms and chest. These go away after the baby is born. HOME CARE INSTRUCTIONS   It is extremely important to avoid all smoking, herbs, alcohol, and unprescribed drugs during your pregnancy. These chemicals affect the formation and growth of the baby. Avoid these chemicals throughout the pregnancy to ensure the delivery of a healthy infant.  Most of your home care instructions are the same as  suggested for the first trimester of your pregnancy. Keep your caregiver's appointments. Follow your caregiver's instructions regarding medication use, exercise and diet.  During pregnancy, you are providing food for you and your baby. Continue to eat regular, well-balanced meals. Choose foods such as meat, fish, milk and other low fat dairy products, vegetables, fruits, and whole-grain breads and cereals. Your caregiver will tell you of the ideal weight gain.  A physical sexual relationship may be continued up until near the end of pregnancy if there are no other problems. Problems could include early (premature) leaking of amniotic fluid from the membranes, vaginal bleeding, abdominal pain, or other medical or pregnancy problems.  Exercise regularly if there are no restrictions. Check with your caregiver if you are unsure of the safety of some of your exercises. The greatest weight gain will occur in the last 2 trimesters of pregnancy. Exercise will help you:  Control your weight.  Get you in shape for labor and delivery.  Lose weight   after you have the baby.  Wear a good support or jogging bra for breast tenderness during pregnancy. This may help if worn during sleep. Pads or tissues may be used in the bra if you are leaking colostrum.  Do not use hot tubs, steam rooms or saunas throughout the pregnancy.  Wear your seat belt at all times when driving. This protects you and your baby if you are in an accident.  Avoid raw meat, uncooked cheese, cat litter boxes and soil used by cats. These carry germs that can cause birth defects in the baby.  The second trimester is also a good time to visit your dentist for your dental health if this has not been done yet. Getting your teeth cleaned is OK. Use a soft toothbrush. Brush gently during pregnancy.  It is easier to loose urine during pregnancy. Tightening up and strengthening the pelvic muscles will help with this problem. Practice stopping your  urination while you are going to the bathroom. These are the same muscles you need to strengthen. It is also the muscles you would use as if you were trying to stop from passing gas. You can practice tightening these muscles up 10 times a set and repeating this about 3 times per day. Once you know what muscles to tighten up, do not perform these exercises during urination. It is more likely to contribute to an infection by backing up the urine.  Ask for help if you have financial, counseling or nutritional needs during pregnancy. Your caregiver will be able to offer counseling for these needs as well as refer you for other special needs.  Your skin may become oily. If so, wash your face with mild soap, use non-greasy moisturizer and oil or cream based makeup. MEDICATIONS AND DRUG USE IN PREGNANCY  Take prenatal vitamins as directed. The vitamin should contain 1 milligram of folic acid. Keep all vitamins out of reach of children. Only a couple vitamins or tablets containing iron may be fatal to a baby or young child when ingested.  Avoid use of all medications, including herbs, over-the-counter medications, not prescribed or suggested by your caregiver. Only take over-the-counter or prescription medicines for pain, discomfort, or fever as directed by your caregiver. Do not use aspirin.  Let your caregiver also know about herbs you may be using.  Alcohol is related to a number of birth defects. This includes fetal alcohol syndrome. All alcohol, in any form, should be avoided completely. Smoking will cause low birth rate and premature babies.  Street or illegal drugs are very harmful to the baby. They are absolutely forbidden. A baby born to an addicted mother will be addicted at birth. The baby will go through the same withdrawal an adult does. SEEK MEDICAL CARE IF:  You have any concerns or worries during your pregnancy. It is better to call with your questions if you feel they cannot wait, rather  than worry about them. SEEK IMMEDIATE MEDICAL CARE IF:   An unexplained oral temperature above 102 F (38.9 C) develops, or as your caregiver suggests.  You have leaking of fluid from the vagina (birth canal). If leaking membranes are suspected, take your temperature and tell your caregiver of this when you call.  There is vaginal spotting, bleeding, or passing clots. Tell your caregiver of the amount and how many pads are used. Light spotting in pregnancy is common, especially following intercourse.  You develop a bad smelling vaginal discharge with a change in the color from clear   to white.  You continue to feel sick to your stomach (nauseated) and have no relief from remedies suggested. You vomit blood or coffee ground-like materials.  You lose more than 2 pounds of weight or gain more than 2 pounds of weight over 1 week, or as suggested by your caregiver.  You notice swelling of your face, hands, feet, or legs.  You get exposed to German measles and have never had them.  You are exposed to fifth disease or chickenpox.  You develop belly (abdominal) pain. Round ligament discomfort is a common non-cancerous (benign) cause of abdominal pain in pregnancy. Your caregiver still must evaluate you.  You develop a bad headache that does not go away.  You develop fever, diarrhea, pain with urination, or shortness of breath.  You develop visual problems, blurry, or double vision.  You fall or are in a car accident or any kind of trauma.  There is mental or physical violence at home. Document Released: 01/10/2001 Document Revised: 04/10/2011 Document Reviewed: 07/15/2008 [employer] Patient Information 2013 [employer].  

## 2012-04-18 NOTE — Addendum Note (Signed)
Addended by: Lazaro Arms on: 04/18/2012 02:34 PM   Modules accepted: Orders

## 2012-04-18 NOTE — Progress Notes (Signed)
Patient reports good fetal movement, denies any bleeding and no rupture of membranes symptoms or contraction.  Patient trying too aggressively to wean her Saboxone, I have encouraged her to maintain her dose to prevent cravings and more serious medical issues.  Next visit HR scan.

## 2012-04-25 LAB — MATERNAL SCREEN, INTEGRATED #2
AFP MoM: 0.81
AFP, Serum: 34 ng/mL
Age risk Down Syndrome: 1:720 {titer}
Calculated Gestational Age: 16.9
Crown Rump Length: 66.3 mm
Estriol Mom: 1.17
Estriol, Free: 0.84 ng/mL
Inhibin A Dimeric: 314 pg/mL
Inhibin A MoM: 1.83
MSS Down Syndrome: <1:5000 {titer}
MSS Trisomy 18 Risk: <1:5000 {titer}
Maternal weight: 149 [lb_av]
NT MoM: 1.07
Nuchal Translucency: 1.59 mm
Number of fetuses: 1
PAPP-A MoM: 1.32
PAPP-A: 1379 ng/mL
Referring Physician NPI: 1881783975
Referring Physician Phone: 3363426063
Rish for ONTD: <1:5000 {titer}
hCG MoM: 1.34
hCG, Serum: 29.9 IU/mL

## 2012-04-26 ENCOUNTER — Inpatient Hospital Stay (HOSPITAL_COMMUNITY)
Admission: AD | Admit: 2012-04-26 | Discharge: 2012-04-26 | Disposition: A | Discharge status: Home / Self Care | Payer: Medicaid Other | Source: Ambulatory Visit | Attending: Obstetrics & Gynecology | Admitting: Obstetrics & Gynecology

## 2012-04-26 ENCOUNTER — Encounter (HOSPITAL_COMMUNITY): Payer: Self-pay | Admitting: *Deleted

## 2012-04-26 DIAGNOSIS — O210 Mild hyperemesis gravidarum: Secondary | ICD-10-CM | POA: Insufficient documentation

## 2012-04-26 DIAGNOSIS — R51 Headache: Secondary | ICD-10-CM | POA: Insufficient documentation

## 2012-04-26 DIAGNOSIS — O99891 Other specified diseases and conditions complicating pregnancy: Secondary | ICD-10-CM | POA: Insufficient documentation

## 2012-04-26 DIAGNOSIS — G43909 Migraine, unspecified, not intractable, without status migrainosus: Secondary | ICD-10-CM | POA: Insufficient documentation

## 2012-04-26 MED ORDER — DEXAMETHASONE SODIUM PHOSPHATE 10 MG/ML IJ SOLN
10.0000 mg | Freq: Once | INTRAMUSCULAR | Status: AC
  Administered 2012-04-26: 10 mg via INTRAVENOUS
  Filled 2012-04-26: qty 1

## 2012-04-26 MED ORDER — ONDANSETRON HCL 4 MG PO TABS: 4.0000 mg | ORAL_TABLET | Freq: Four times a day (QID) | ORAL | Status: DC

## 2012-04-26 MED ORDER — PROMETHAZINE HCL 25 MG PO TABS: 25.0000 mg | ORAL_TABLET | Freq: Four times a day (QID) | ORAL | Status: DC | PRN

## 2012-04-26 MED ORDER — SODIUM CHLORIDE 0.9 % IV SOLN
INTRAVENOUS | Status: DC
  Administered 2012-04-26: 17:00:00 via INTRAVENOUS

## 2012-04-26 MED ORDER — DIPHENHYDRAMINE HCL 50 MG/ML IJ SOLN
25.0000 mg | Freq: Once | INTRAMUSCULAR | Status: DC
  Filled 2012-04-26: qty 1

## 2012-04-26 MED ORDER — PROCHLORPERAZINE EDISYLATE 5 MG/ML IJ SOLN
10.0000 mg | Freq: Four times a day (QID) | INTRAMUSCULAR | Status: DC | PRN
  Administered 2012-04-26: 10 mg via INTRAVENOUS
  Filled 2012-04-26: qty 2

## 2012-04-26 NOTE — MAU Note (Signed)
Patient presents to MAU with c/o migraine since yesterday; reports she has not been able to hold anything down since Wednesday night. Denies vaginal bleeding, LOF, or cramping.

## 2012-04-26 NOTE — MAU Provider Note (Signed)
History     CSN: [id number]  Arrival date and time: 04/26/12 1518   First Provider Initiated Contact with Patient 04/26/12 1608      Chief Complaint  Patient presents with  . Emesis  . Headache  . Dizziness   HPI Ms. Marcia Hood is a 32 y.o. G3P2002 at [redacted]w[redacted]d who presents to MAU today with headache, dizziness and N/V. The patient has a history of migraine. This headache started yesterday morning. She tried to take phenergan yesterday, but vomited soon after. She also took tylenol without relief. She denies fever, vaginal bleeding, vaginal discharge or UTI symptoms. She does have some upper abdominal pain.    OB History   Grav Para Term Preterm Abortions TAB SAB Ect Mult Living   3 2 2  0 0 0 0 0 0 2      Past Medical History  Diagnosis Date  . Hypertension   . Depression   . Anxiety   . Pregnant   . Migraine   . Chronic back pain     Past Surgical History  Procedure Laterality Date  . No past surgeries      Family History  Problem Relation Age of Onset  . Anesthesia problems Neg Hx   . Hypertension Mother   . Diabetes Father   . Heart attack Maternal Grandmother   . Cancer Maternal Grandmother     Bladder    History  Substance Use Topics  . Smoking status: Former Smoker -- 0.50 packs/day for 10 years    Types: Cigarettes  . Smokeless tobacco: Not on file  . Alcohol Use: No    Allergies:  Allergies  Allergen Reactions  . Penicillins Rash    Prescriptions prior to admission  Medication Sig Dispense Refill  . calcium carbonate (TUMS - DOSED IN MG ELEMENTAL CALCIUM) 500 MG chewable tablet Chew 1 tablet by mouth 2 (two) times daily as needed. For heartburn      . FLUoxetine (PROZAC) 20 MG capsule Take 20 mg by mouth daily.      . ondansetron (ZOFRAN) 8 MG tablet Take 1 tablet (8 mg total) by mouth every 8 (eight) hours as needed for nausea.  20 tablet  0  . Prenatal Vit-Fe Fumarate-FA (PRENATAL MULTIVITAMIN) TABS Take 1 tablet by mouth daily.       Marland Kitchen rOPINIRole (REQUIP) 1 MG tablet Take 1 mg by mouth at bedtime.      Marland Kitchen zolpidem (AMBIEN) 10 MG tablet Take 10 mg by mouth at bedtime.        Review of Systems  Constitutional: Negative for fever and malaise/fatigue.  Gastrointestinal: Positive for nausea, vomiting, abdominal pain and constipation. Negative for diarrhea.  Genitourinary: Negative for dysuria, urgency and frequency.       Neg - vaginal discharge Neg - vaginal bleeding  Musculoskeletal: Positive for back pain.  Neurological: Positive for dizziness. Negative for loss of consciousness.   Physical Exam   Blood pressure 110/66, pulse 62, temperature 97.9 F (36.6 C), temperature source Oral, resp. rate 18, height 5\' 4"  (1.626 m), weight 145 lb (65.772 kg), last menstrual period 12/25/2011, SpO2 99.00%.  Physical Exam  Constitutional: She is oriented to person, place, and time. She appears well-developed and well-nourished. No distress.  HENT:  Head: Normocephalic and atraumatic.  Cardiovascular: Normal rate, regular rhythm and normal heart sounds.   Respiratory: Effort normal and breath sounds normal. No respiratory distress.  GI: Soft. Bowel sounds are normal. She exhibits no distension and no mass. There  is tenderness (mild upper abdominal tenderness to palpation). There is no rebound and no guarding.  Neurological: She is alert and oriented to person, place, and time.  Skin: Skin is warm and dry. No erythema.  Psychiatric: She has a normal mood and affect.    MAU Course  Procedures None  MDM Patient has a history of migraine. She has only been able to urinate once today. Will start IV with headache protocol.  1 L IV NS, IV Benadryl 25 mg, compazine 10 mg, decadron 10 mg.  Patient reports significant improvement in symptoms  Assessment and Plan  A: Migraine headache  P: Discharge home Rx for Zofran and Phenergan sent to patient's pharmacy Keep follow-up at [facility] as scheduled Patient may return to MAU  as needed  Freddi Starr, PA-C  04/26/2012, 4:09 PM

## 2012-04-26 NOTE — MAU Note (Signed)
Headache started yesterday.  Hx of migraines. Feeling dizzy, seeing some spots.  Started feeling nauseated yesterday with headache, has not been able to keep anything down.

## 2012-05-06 ENCOUNTER — Other Ambulatory Visit: Payer: Self-pay | Admitting: Obstetrics & Gynecology

## 2012-05-06 DIAGNOSIS — O99320 Drug use complicating pregnancy, unspecified trimester: Secondary | ICD-10-CM

## 2012-05-06 DIAGNOSIS — F1920 Other psychoactive substance dependence, uncomplicated: Secondary | ICD-10-CM

## 2012-05-16 ENCOUNTER — Other Ambulatory Visit: Payer: Self-pay | Admitting: Advanced Practice Midwife

## 2012-05-16 ENCOUNTER — Ambulatory Visit (INDEPENDENT_AMBULATORY_CARE_PROVIDER_SITE_OTHER): Payer: Non-veteran care | Admitting: Advanced Practice Midwife

## 2012-05-16 ENCOUNTER — Ambulatory Visit (INDEPENDENT_AMBULATORY_CARE_PROVIDER_SITE_OTHER): Payer: TRICARE For Life (TFL)

## 2012-05-16 ENCOUNTER — Encounter: Payer: Self-pay | Admitting: Advanced Practice Midwife

## 2012-05-16 VITALS — BP 110/68 | Wt 152.0 lb

## 2012-05-16 DIAGNOSIS — O0992 Supervision of high risk pregnancy, unspecified, second trimester: Secondary | ICD-10-CM

## 2012-05-16 DIAGNOSIS — F1920 Other psychoactive substance dependence, uncomplicated: Secondary | ICD-10-CM

## 2012-05-16 DIAGNOSIS — O99320 Drug use complicating pregnancy, unspecified trimester: Secondary | ICD-10-CM

## 2012-05-16 DIAGNOSIS — F1121 Opioid dependence, in remission: Secondary | ICD-10-CM

## 2012-05-16 DIAGNOSIS — O0990 Supervision of high risk pregnancy, unspecified, unspecified trimester: Secondary | ICD-10-CM

## 2012-05-16 LAB — POCT URINALYSIS DIPSTICK
Glucose, UA: NEGATIVE
Ketones, UA: NEGATIVE
Leukocytes, UA: NEGATIVE
Nitrite, UA: NEGATIVE
Protein, UA: NEGATIVE

## 2012-05-16 LAB — RAPID URINE DRUG SCREEN, HOSP PERFORMED
Amphetamine: POSITIVE
Benzodiazepines.: POSITIVE
COCAINE: POSITIVE

## 2012-05-16 NOTE — Addendum Note (Signed)
Addended by: Jacklyn Shell on: 05/16/2012 01:49 PM   Modules accepted: Orders

## 2012-05-16 NOTE — Addendum Note (Signed)
Addended by: Jacklyn Shell on: 05/16/2012 11:47 AM   Modules accepted: Orders

## 2012-05-16 NOTE — Progress Notes (Signed)
Had anatomy scan; see note below.    No c/o at this time.  Routine questions about pregnancy answered.  F/U in 4 weeks for LROB.

## 2012-05-16 NOTE — Progress Notes (Signed)
U/S-(20+3wks)-single active fetus, approp growth, fluid wnl, ant gr 0 plac, cx long and closed, no major abnl noted, female fetus, bilateral adnexa wnl

## 2012-05-17 LAB — OXYCODONE SCREEN, UA, RFLX CONFIRM

## 2012-05-17 LAB — DRUG SCREEN, URINE, NO CONFIRMATION
Amphetamine Screen, Ur: NEGATIVE
Barbiturate Quant, Ur: NEGATIVE
Benzodiazepines.: NEGATIVE
Cocaine Metabolites: NEGATIVE
Creatinine,U: 421 mg/dL
Marijuana Metabolite: NEGATIVE
Methadone: NEGATIVE
Opiate Screen, Urine: NEGATIVE
Phencyclidine (PCP): NEGATIVE
Propoxyphene: NEGATIVE

## 2012-05-18 LAB — OPIATES/OPIOIDS (LC/MS-MS)
Codeine Urine: NEGATIVE ng/mL
Heroin (6-AM), UR: NEGATIVE ng/mL
Hydrocodone: NEGATIVE ng/mL
Hydromorphone: NEGATIVE ng/mL
Morphine Urine: NEGATIVE ng/mL
Norhydrocodone, Ur: NEGATIVE ng/mL
Noroxycodone, Ur: NEGATIVE ng/mL
Oxycodone, ur: NEGATIVE ng/mL
Oxymorphone: NEGATIVE ng/mL

## 2012-05-22 LAB — US OB DETAIL + 14 WK

## 2012-06-12 ENCOUNTER — Ambulatory Visit (INDEPENDENT_AMBULATORY_CARE_PROVIDER_SITE_OTHER): Payer: Non-veteran care | Admitting: Obstetrics & Gynecology

## 2012-06-12 ENCOUNTER — Encounter: Payer: Self-pay | Admitting: Obstetrics & Gynecology

## 2012-06-12 VITALS — BP 110/60 | Temp 99.0°F | Wt 155.0 lb

## 2012-06-12 DIAGNOSIS — Z1389 Encounter for screening for other disorder: Secondary | ICD-10-CM

## 2012-06-12 DIAGNOSIS — Z331 Pregnant state, incidental: Secondary | ICD-10-CM

## 2012-06-12 DIAGNOSIS — F1920 Other psychoactive substance dependence, uncomplicated: Secondary | ICD-10-CM

## 2012-06-12 DIAGNOSIS — O0990 Supervision of high risk pregnancy, unspecified, unspecified trimester: Secondary | ICD-10-CM

## 2012-06-12 DIAGNOSIS — N390 Urinary tract infection, site not specified: Secondary | ICD-10-CM

## 2012-06-12 LAB — POCT URINALYSIS DIPSTICK
Blood, UA: NEGATIVE
Glucose, UA: NEGATIVE
Ketones, UA: NEGATIVE
Leukocytes, UA: NEGATIVE
Nitrite, UA: POSITIVE
Protein, UA: NEGATIVE

## 2012-06-12 NOTE — Progress Notes (Signed)
URINE IS CLOUDY AND SHOWED NITRITE. TEMP 99. DON'T FEEL GOOD.

## 2012-06-12 NOTE — Addendum Note (Signed)
Addended by: Colen Darling on: 06/12/2012 03:24 PM   Modules accepted: Orders

## 2012-06-12 NOTE — Progress Notes (Signed)
BP weight and urine results all reviewed and noted. Patient reports good fetal movement, denies any bleeding and no rupture of membranes symptoms or regular contractions. Patient is without complaints. All questions were answered.  

## 2012-06-12 NOTE — Patient Instructions (Signed)
How a Baby Grows During Pregnancy Pregnancy begins when the female's sperm enters the female's egg. This happens in the fallopian tube and is called fertilization. The fertilized egg is called an embryo until it reaches 9 weeks from the time of fertilization. From 9 weeks until birth it is called a fetus. The fertilized egg moves down the tube into the uterus and attaches to the inside lining of the uterus.  The pregnant woman is responsible for the growth of the embryo/fetus by supplying nourishment and oxygen through the blood stream and placenta to the developing fetus. The uterus becomes larger and pops out from the abdomen more and more as the fetus develops and grows. A normal pregnancy lasts 280 days, with a range of 259 to 294 days, or 40 weeks. The pregnancy is divided up into three trimesters:  First trimester - 0 to 13 weeks.  Second trimester - 14 to 27 weeks.  Third trimester - 28 to 40 weeks. The day your baby is supposed to be born is called estimated date of confinement [facility]) or estimated date of delivery (EDD). GROWTH OF THE BABY MONTH BY MONTH 1. First Month: The fertilized egg attaches to the inside of the uterus and certain cells will form the placenta and others will develop into the fetus. The arms, legs, brain, spinal cord, lungs, and heart begin to develop. At the end of the first month the heart begins to beat. The embryo weighs less than an ounce and is  inch long. 2. Second Month: The bones can be seen, the inner ear, eye lids, hands and feet form and genitals develop. By the end of 8 weeks, all of the major organs are developing. The fetus now weighs less than an ounce and is one inch (2.54 cm) long. 3. Third Month: Teeth buds appear, all the internal organs are forming, bones and muscles begin to grow, the spine can flex and the skin is transparent. Finger and toe nails begin to form, the hands develop faster than the feet and the arms are longer than the legs at this point.  The fetus weighs a little more than an ounce (0.03 kg) and is 3 inches (8.89cm) long. 4. Fourth Month: The placenta is completely formed. The external sex organs, neck, outer ear, eyebrows, eyelids and fingernails are formed. The fetus can hear, swallow, flex its arms and legs and the kidney begins to produce urine. The skin is covered with a white waxy coating (vernix) and very thin hair (lanugo) is present. The fetus weighs 5 ounces (0.14kg) and is 6 to 7 inches (16.51cm) long. 5. Fifth Month: The fetus moves around more and can be felt for the first time (called quickening), sleeps and wakes up at times, may begin to suck its finger and the nails grow to the end of the fingers. The gallbladder is now functioning and helps to digest the nutrients, eggs are formed in the female and the testicles begin to drop down from the abdomen to the scrotum in the female. The fetus weighs  to 1 pound (0.45kg) and is 10 inches (25.4cm) long. 6. Sixth Month: The lungs are formed but the fetus does not breath yet. The eyes open, the brain develops more quickly at this time, one can detect finger and toe prints and thicker hair grows. The fetus weighs 1 to 1 pounds (0.68kg) and is 12 inches (30.48cm) long. 7. Seventh Month: The fetus can hear and respond to sounds, kicks and stretches and can sense  changes in light. The fetus weighs 2 to 2 pounds (1.13kg) and is 14 inches (35.56cm) long. 8. Eight Month: All organs and body systems are fully developed and functioning. The bones get harder, taste buds develop and can taste sweet and sour flavors and the fetus may hiccup now. Different parts of the brain are developing and the skull remains soft for the brain to grow. The fetus weighs 5 pounds (2.27kg) and is 18 inches (45.75cm) long. 9. Ninth Month: The fetus gains about a half a pound a week, the lungs are fully developed, patterns of sleep develop and the head moves down into the bottom of the uterus called vertex. If the  buttocks moves into the bottom of the uterus, it is called a breech. The fetus weighs 6 to 9 pounds (2.72 to 4.08kg) and is 20 inches (50.8cm) long. You should be informed about your pregnancy, yourself and how the baby is developing as much as possible. Being informed helps you to enjoy this experience. It also gives you the sense to feel if something is not going right and when to ask questions. Talk to your caregiver when you have questions about your baby or your own body. Document Released: 07/05/2007 Document Revised: 04/10/2011 Document Reviewed: 07/05/2007 [facility] Patient Information 2013 [place], [place].

## 2012-06-13 ENCOUNTER — Telehealth: Payer: Self-pay | Admitting: Obstetrics & Gynecology

## 2012-06-13 MED ORDER — NITROFURANTOIN MONOHYD MACRO 100 MG PO CAPS: 100.0000 mg | ORAL_CAPSULE | Freq: Two times a day (BID) | ORAL | Status: DC

## 2012-06-13 NOTE — Telephone Encounter (Signed)
+  Nitrates, culture pending, macrobid e prescribed

## 2012-06-13 NOTE — Telephone Encounter (Signed)
Left message on answering machine that med had been sent to pharmacy for possible UTI.

## 2012-06-13 NOTE — Addendum Note (Signed)
Addended by: Lazaro Arms on: 06/13/2012 06:37 AM   Modules accepted: Orders

## 2012-06-15 LAB — URINE CULTURE: Colony Count: >=100000

## 2012-06-28 ENCOUNTER — Encounter: Payer: Self-pay | Admitting: Obstetrics & Gynecology

## 2012-06-28 ENCOUNTER — Telehealth: Payer: Self-pay | Admitting: Obstetrics & Gynecology

## 2012-06-28 NOTE — Telephone Encounter (Signed)
Letter for clinic change done

## 2012-06-28 NOTE — Telephone Encounter (Signed)
Pt informed letter left at front desk.

## 2012-07-10 ENCOUNTER — Encounter: Payer: Non-veteran care | Admitting: Advanced Practice Midwife

## 2012-07-10 ENCOUNTER — Other Ambulatory Visit: Payer: Non-veteran care

## 2012-07-15 ENCOUNTER — Ambulatory Visit: Payer: Non-veteran care | Admitting: Obstetrics & Gynecology

## 2012-07-15 ENCOUNTER — Encounter: Payer: Self-pay | Admitting: Women's Health

## 2012-07-15 ENCOUNTER — Ambulatory Visit (INDEPENDENT_AMBULATORY_CARE_PROVIDER_SITE_OTHER): Payer: Non-veteran care | Admitting: Women's Health

## 2012-07-15 VITALS — BP 118/60 | Wt 154.8 lb

## 2012-07-15 DIAGNOSIS — O99019 Anemia complicating pregnancy, unspecified trimester: Secondary | ICD-10-CM

## 2012-07-15 DIAGNOSIS — Z3482 Encounter for supervision of other normal pregnancy, second trimester: Secondary | ICD-10-CM

## 2012-07-15 DIAGNOSIS — O99320 Drug use complicating pregnancy, unspecified trimester: Secondary | ICD-10-CM

## 2012-07-15 DIAGNOSIS — O0993 Supervision of high risk pregnancy, unspecified, third trimester: Secondary | ICD-10-CM

## 2012-07-15 DIAGNOSIS — Z1389 Encounter for screening for other disorder: Secondary | ICD-10-CM

## 2012-07-15 DIAGNOSIS — F1121 Opioid dependence, in remission: Secondary | ICD-10-CM

## 2012-07-15 DIAGNOSIS — F1920 Other psychoactive substance dependence, uncomplicated: Secondary | ICD-10-CM

## 2012-07-15 DIAGNOSIS — Z331 Pregnant state, incidental: Secondary | ICD-10-CM

## 2012-07-15 DIAGNOSIS — O09899 Supervision of other high risk pregnancies, unspecified trimester: Secondary | ICD-10-CM

## 2012-07-15 LAB — CBC
HCT: 29 % — ABNORMAL LOW (ref 36.0–46.0)
Hemoglobin: 10.1 g/dL — ABNORMAL LOW (ref 12.0–15.0)
MCH: 31.5 pg (ref 26.0–34.0)
MCHC: 34.8 g/dL (ref 30.0–36.0)
MCV: 90.3 fL (ref 78.0–100.0)
Platelets: 211 10*3/uL (ref 150–400)
RBC: 3.21 MIL/uL — ABNORMAL LOW (ref 3.87–5.11)
RDW: 13.4 % (ref 11.5–15.5)
WBC: 14.5 10*3/uL — ABNORMAL HIGH (ref 4.0–10.5)

## 2012-07-15 LAB — POCT URINALYSIS DIPSTICK
Glucose, UA: NEGATIVE
Ketones, UA: NEGATIVE
Leukocytes, UA: NEGATIVE
Nitrite, UA: NEGATIVE
Protein, UA: NEGATIVE

## 2012-07-15 NOTE — Progress Notes (Signed)
Reports good fm. Denies uc's, lof, vb, urinary frequency, urgency, hesitancy, or dysuria.  No complaints.  Reviewed ptl s/s and fetal kick counts.  All questions answered. PN2 today. F/U in 3wks for visit.

## 2012-07-15 NOTE — Addendum Note (Signed)
Addended by: Colen Darling on: 07/15/2012 10:19 AM   Modules accepted: Orders

## 2012-07-15 NOTE — Patient Instructions (Signed)

## 2012-07-16 ENCOUNTER — Encounter: Payer: Self-pay | Admitting: Women's Health

## 2012-07-16 LAB — DRUG SCREEN, URINE, NO CONFIRMATION
Amphetamine Screen, Ur: NEGATIVE
Barbiturate Quant, Ur: NEGATIVE
Benzodiazepines.: NEGATIVE
Cocaine Metabolites: NEGATIVE
Creatinine,U: 148.7 mg/dL
Marijuana Metabolite: NEGATIVE
Methadone: NEGATIVE
Opiate Screen, Urine: NEGATIVE
Phencyclidine (PCP): NEGATIVE
Propoxyphene: NEGATIVE

## 2012-07-16 LAB — HIV ANTIBODY (ROUTINE TESTING W REFLEX): HIV: NONREACTIVE

## 2012-07-16 LAB — RPR

## 2012-07-16 LAB — HSV 2 ANTIBODY, IGG: HSV 2 Glycoprotein G Ab, IgG: 0.15 IV

## 2012-07-16 LAB — GLUCOSE TOLERANCE, 2 HOURS W/ 1HR
Glucose, 1 hour: 120 mg/dL (ref 70–170)
Glucose, 2 hour: 78 mg/dL (ref 70–139)
Glucose, Fasting: 88 mg/dL (ref 70–99)

## 2012-07-16 LAB — ANTIBODY SCREEN: Antibody Screen: NEGATIVE

## 2012-07-16 LAB — OXYCODONE SCREEN, UA, RFLX CONFIRM: Oxycodone Screen, Ur: NEGATIVE ng/mL

## 2012-08-12 ENCOUNTER — Ambulatory Visit (INDEPENDENT_AMBULATORY_CARE_PROVIDER_SITE_OTHER): Payer: Non-veteran care | Admitting: Women's Health

## 2012-08-12 ENCOUNTER — Encounter: Payer: Self-pay | Admitting: Women's Health

## 2012-08-12 VITALS — BP 110/80 | Wt 157.0 lb

## 2012-08-12 DIAGNOSIS — O99019 Anemia complicating pregnancy, unspecified trimester: Secondary | ICD-10-CM

## 2012-08-12 DIAGNOSIS — O10919 Unspecified pre-existing hypertension complicating pregnancy, unspecified trimester: Secondary | ICD-10-CM | POA: Insufficient documentation

## 2012-08-12 DIAGNOSIS — Z1389 Encounter for screening for other disorder: Secondary | ICD-10-CM

## 2012-08-12 DIAGNOSIS — Z331 Pregnant state, incidental: Secondary | ICD-10-CM

## 2012-08-12 DIAGNOSIS — O10913 Unspecified pre-existing hypertension complicating pregnancy, third trimester: Secondary | ICD-10-CM

## 2012-08-12 DIAGNOSIS — O09899 Supervision of other high risk pregnancies, unspecified trimester: Secondary | ICD-10-CM

## 2012-08-12 DIAGNOSIS — Z3483 Encounter for supervision of other normal pregnancy, third trimester: Secondary | ICD-10-CM

## 2012-08-12 LAB — POCT URINALYSIS DIPSTICK
Blood, UA: NEGATIVE
Glucose, UA: NEGATIVE
Ketones, UA: NEGATIVE
Leukocytes, UA: NEGATIVE
Nitrite, UA: NEGATIVE

## 2012-08-12 NOTE — Progress Notes (Signed)
Reports good fm. Denies uc's, lof, vb, urinary frequency, urgency, hesitancy, or dysuria.  No complaints.  Reviewed ptl s/s, fetal kick counts. States h/o CHTN dx 2 yrs ago, was put on medicine but d/c'd herself >20yr ago w/o any htn since. No antenatal testing at this time.  All questions answered. F/U in 2wks for visit.

## 2012-08-26 ENCOUNTER — Encounter: Payer: Non-veteran care | Admitting: Women's Health

## 2012-08-29 ENCOUNTER — Ambulatory Visit (INDEPENDENT_AMBULATORY_CARE_PROVIDER_SITE_OTHER): Payer: Non-veteran care | Admitting: Women's Health

## 2012-08-29 VITALS — BP 98/68 | Wt 155.6 lb

## 2012-08-29 DIAGNOSIS — O10913 Unspecified pre-existing hypertension complicating pregnancy, third trimester: Secondary | ICD-10-CM

## 2012-08-29 DIAGNOSIS — O99019 Anemia complicating pregnancy, unspecified trimester: Secondary | ICD-10-CM

## 2012-08-29 DIAGNOSIS — F1920 Other psychoactive substance dependence, uncomplicated: Secondary | ICD-10-CM

## 2012-08-29 DIAGNOSIS — Z1389 Encounter for screening for other disorder: Secondary | ICD-10-CM

## 2012-08-29 DIAGNOSIS — O0993 Supervision of high risk pregnancy, unspecified, third trimester: Secondary | ICD-10-CM

## 2012-08-29 DIAGNOSIS — F1121 Opioid dependence, in remission: Secondary | ICD-10-CM

## 2012-08-29 DIAGNOSIS — Z331 Pregnant state, incidental: Secondary | ICD-10-CM

## 2012-08-29 DIAGNOSIS — O09899 Supervision of other high risk pregnancies, unspecified trimester: Secondary | ICD-10-CM

## 2012-08-29 LAB — POCT URINALYSIS DIPSTICK
Blood, UA: NEGATIVE
Glucose, UA: NEGATIVE
Ketones, UA: NEGATIVE
Leukocytes, UA: NEGATIVE
Nitrite, UA: NEGATIVE
Protein, UA: NEGATIVE

## 2012-08-29 NOTE — Patient Instructions (Signed)

## 2012-08-29 NOTE — Progress Notes (Signed)
No complaints at this time.

## 2012-08-29 NOTE — Progress Notes (Signed)
Reports good fm. Denies uc's, lof, vb, urinary frequency, urgency, hesitancy, or dysuria.  No complaints.  Reviewed ptl s/s, fetal kick counts.  All questions answered. F/U in 1wk for visit.

## 2012-09-06 ENCOUNTER — Ambulatory Visit (INDEPENDENT_AMBULATORY_CARE_PROVIDER_SITE_OTHER): Payer: Non-veteran care | Admitting: Obstetrics & Gynecology

## 2012-09-06 ENCOUNTER — Encounter: Payer: Self-pay | Admitting: Obstetrics & Gynecology

## 2012-09-06 VITALS — BP 120/80 | Wt 154.0 lb

## 2012-09-06 DIAGNOSIS — O99019 Anemia complicating pregnancy, unspecified trimester: Secondary | ICD-10-CM

## 2012-09-06 DIAGNOSIS — O09899 Supervision of other high risk pregnancies, unspecified trimester: Secondary | ICD-10-CM

## 2012-09-06 DIAGNOSIS — Z331 Pregnant state, incidental: Secondary | ICD-10-CM

## 2012-09-06 DIAGNOSIS — Z1389 Encounter for screening for other disorder: Secondary | ICD-10-CM

## 2012-09-06 DIAGNOSIS — Z3480 Encounter for supervision of other normal pregnancy, unspecified trimester: Secondary | ICD-10-CM

## 2012-09-06 LAB — POCT URINALYSIS DIPSTICK
Blood, UA: NEGATIVE
Glucose, UA: NEGATIVE
Ketones, UA: NEGATIVE
Nitrite, UA: NEGATIVE
Protein, UA: NEGATIVE

## 2012-09-06 LAB — OB RESULTS CONSOLE GBS: GBS: NEGATIVE

## 2012-09-06 LAB — OB RESULTS CONSOLE GC/CHLAMYDIA
Chlamydia: NEGATIVE
Gonorrhea: NEGATIVE

## 2012-09-06 NOTE — Progress Notes (Signed)
For GBS/GC/CHL TODAY. Having a little pressure.

## 2012-09-06 NOTE — Patient Instructions (Addendum)

## 2012-09-06 NOTE — Progress Notes (Signed)
BP weight and urine results all reviewed and noted. Patient reports good fetal movement, denies any bleeding and no rupture of membranes symptoms or regular contractions. Patient is without complaints. All questions were answered. Discussed constipation

## 2012-09-07 LAB — GC/CHLAMYDIA PROBE AMP
CT Probe RNA: NEGATIVE
GC Probe RNA: NEGATIVE

## 2012-09-09 LAB — CULTURE, BETA STREP (GROUP B ONLY)

## 2012-09-13 ENCOUNTER — Ambulatory Visit (INDEPENDENT_AMBULATORY_CARE_PROVIDER_SITE_OTHER): Payer: Non-veteran care | Admitting: Obstetrics & Gynecology

## 2012-09-13 ENCOUNTER — Encounter: Payer: Self-pay | Admitting: Obstetrics & Gynecology

## 2012-09-13 VITALS — BP 108/70 | Wt 156.0 lb

## 2012-09-13 DIAGNOSIS — Z331 Pregnant state, incidental: Secondary | ICD-10-CM

## 2012-09-13 DIAGNOSIS — O09899 Supervision of other high risk pregnancies, unspecified trimester: Secondary | ICD-10-CM

## 2012-09-13 DIAGNOSIS — Z1389 Encounter for screening for other disorder: Secondary | ICD-10-CM

## 2012-09-13 DIAGNOSIS — O99019 Anemia complicating pregnancy, unspecified trimester: Secondary | ICD-10-CM

## 2012-09-13 LAB — POCT URINALYSIS DIPSTICK
Blood, UA: NEGATIVE
Glucose, UA: NEGATIVE
Ketones, UA: NEGATIVE
Nitrite, UA: NEGATIVE
Protein, UA: NEGATIVE

## 2012-09-13 NOTE — Progress Notes (Signed)
Pain in rt. Side of stomach, last night.

## 2012-09-13 NOTE — Progress Notes (Signed)
BP weight and urine results all reviewed and noted. Patient reports good fetal movement, denies any bleeding and no rupture of membranes symptoms or regular contractions. Patient is without complaints. All questions were answered.  

## 2012-09-20 ENCOUNTER — Ambulatory Visit (INDEPENDENT_AMBULATORY_CARE_PROVIDER_SITE_OTHER): Payer: Non-veteran care | Admitting: Obstetrics and Gynecology

## 2012-09-20 VITALS — BP 100/60 | Wt 155.0 lb

## 2012-09-20 DIAGNOSIS — O99019 Anemia complicating pregnancy, unspecified trimester: Secondary | ICD-10-CM

## 2012-09-20 DIAGNOSIS — Z1389 Encounter for screening for other disorder: Secondary | ICD-10-CM

## 2012-09-20 DIAGNOSIS — F1920 Other psychoactive substance dependence, uncomplicated: Secondary | ICD-10-CM

## 2012-09-20 LAB — POCT URINALYSIS DIPSTICK
Blood, UA: NEGATIVE
Glucose, UA: NEGATIVE
Ketones, UA: NEGATIVE
Leukocytes, UA: NEGATIVE
Nitrite, UA: NEGATIVE
Protein, UA: NEGATIVE

## 2012-09-20 NOTE — Progress Notes (Signed)
Pt here today for routine visit. Pt states she has good fetal movement and still having pain and pressure in her lower abdominal/ pelvic area. Pt denies any other problems or concerns at this time.  Uncomplicated pregnancy, Good FM today. No bleeding.

## 2012-09-27 ENCOUNTER — Ambulatory Visit (INDEPENDENT_AMBULATORY_CARE_PROVIDER_SITE_OTHER): Payer: Non-veteran care | Admitting: Obstetrics & Gynecology

## 2012-09-27 ENCOUNTER — Encounter: Payer: Self-pay | Admitting: Obstetrics & Gynecology

## 2012-09-27 VITALS — BP 130/80 | Wt 159.0 lb

## 2012-09-27 DIAGNOSIS — Z331 Pregnant state, incidental: Secondary | ICD-10-CM

## 2012-09-27 DIAGNOSIS — O99019 Anemia complicating pregnancy, unspecified trimester: Secondary | ICD-10-CM

## 2012-09-27 DIAGNOSIS — Z1389 Encounter for screening for other disorder: Secondary | ICD-10-CM

## 2012-09-27 DIAGNOSIS — O09899 Supervision of other high risk pregnancies, unspecified trimester: Secondary | ICD-10-CM

## 2012-09-27 LAB — POCT URINALYSIS DIPSTICK
Blood, UA: NEGATIVE
Glucose, UA: NEGATIVE
Ketones, UA: NEGATIVE
Leukocytes, UA: NEGATIVE
Nitrite, UA: NEGATIVE
Protein, UA: NEGATIVE

## 2012-09-27 NOTE — Progress Notes (Signed)
Pt unable to void, at this time.

## 2012-09-27 NOTE — Progress Notes (Signed)
Membranes stripped  BP weight and urine results all reviewed and noted. Patient reports good fetal movement, denies any bleeding and no rupture of membranes symptoms or regular contractions. Patient is without complaints. All questions were answered.  

## 2012-09-28 ENCOUNTER — Inpatient Hospital Stay (HOSPITAL_COMMUNITY)
Admission: AD | Admit: 2012-09-28 | Discharge: 2012-10-01 | DRG: 765 | Disposition: A | Discharge status: Home / Self Care | Payer: Medicaid Other | Source: Ambulatory Visit | Attending: Obstetrics & Gynecology | Admitting: Obstetrics & Gynecology

## 2012-09-28 ENCOUNTER — Inpatient Hospital Stay (HOSPITAL_COMMUNITY): Payer: Medicaid Other | Admitting: Anesthesiology

## 2012-09-28 ENCOUNTER — Encounter (HOSPITAL_COMMUNITY): Payer: Self-pay | Admitting: Anesthesiology

## 2012-09-28 ENCOUNTER — Encounter (HOSPITAL_COMMUNITY)
Admission: AD | Disposition: A | Discharge status: Home / Self Care | Payer: Self-pay | Source: Ambulatory Visit | Attending: Obstetrics & Gynecology

## 2012-09-28 ENCOUNTER — Encounter (HOSPITAL_COMMUNITY): Payer: Self-pay | Admitting: Family Medicine

## 2012-09-28 DIAGNOSIS — O0993 Supervision of high risk pregnancy, unspecified, third trimester: Secondary | ICD-10-CM

## 2012-09-28 DIAGNOSIS — F1410 Cocaine abuse, uncomplicated: Secondary | ICD-10-CM | POA: Diagnosis present

## 2012-09-28 DIAGNOSIS — F1920 Other psychoactive substance dependence, uncomplicated: Secondary | ICD-10-CM

## 2012-09-28 DIAGNOSIS — O99344 Other mental disorders complicating childbirth: Secondary | ICD-10-CM | POA: Diagnosis present

## 2012-09-28 DIAGNOSIS — O1002 Pre-existing essential hypertension complicating childbirth: Secondary | ICD-10-CM | POA: Diagnosis present

## 2012-09-28 DIAGNOSIS — Z302 Encounter for sterilization: Secondary | ICD-10-CM

## 2012-09-28 DIAGNOSIS — O321XX0 Maternal care for breech presentation, not applicable or unspecified: Principal | ICD-10-CM | POA: Diagnosis present

## 2012-09-28 LAB — TYPE AND SCREEN
ABO/RH(D): A POS
Antibody Screen: NEGATIVE

## 2012-09-28 LAB — CBC
HCT: 31.3 % — ABNORMAL LOW (ref 36.0–46.0)
Hemoglobin: 10.2 g/dL — ABNORMAL LOW (ref 12.0–15.0)
MCH: 28 pg (ref 26.0–34.0)
MCHC: 32.6 g/dL (ref 30.0–36.0)
MCV: 86 fL (ref 78.0–100.0)
Platelets: 246 10*3/uL (ref 150–400)
RBC: 3.64 MIL/uL — ABNORMAL LOW (ref 3.87–5.11)
RDW: 13.6 % (ref 11.5–15.5)
WBC: 13 10*3/uL — ABNORMAL HIGH (ref 4.0–10.5)

## 2012-09-28 LAB — RAPID URINE DRUG SCREEN, HOSP PERFORMED
Amphetamines: NOT DETECTED
Barbiturates: NOT DETECTED
Benzodiazepines: NOT DETECTED
Cocaine: POSITIVE — AB
Opiates: NOT DETECTED
Tetrahydrocannabinol: NOT DETECTED

## 2012-09-28 LAB — HEPATITIS B SURFACE ANTIGEN: Hepatitis B Surface Ag: NEGATIVE

## 2012-09-28 LAB — RPR: RPR Ser Ql: NONREACTIVE

## 2012-09-28 LAB — ABO/RH: ABO/RH(D): A POS

## 2012-09-28 SURGERY — Surgical Case
Anesthesia: Regional | Site: Abdomen | Wound class: Clean Contaminated

## 2012-09-28 MED ORDER — ONDANSETRON HCL 4 MG/2ML IJ SOLN: 4.0000 mg | Freq: Four times a day (QID) | INTRAMUSCULAR | Status: DC | PRN

## 2012-09-28 MED ORDER — OXYTOCIN 10 UNIT/ML IJ SOLN
INTRAMUSCULAR | Status: AC
  Filled 2012-09-28: qty 2

## 2012-09-28 MED ORDER — NALOXONE HCL 0.4 MG/ML IJ SOLN: 0.4000 mg | INTRAMUSCULAR | Status: DC | PRN

## 2012-09-28 MED ORDER — PHENYLEPHRINE 40 MCG/ML (10ML) SYRINGE FOR IV PUSH (FOR BLOOD PRESSURE SUPPORT)
80.0000 ug | PREFILLED_SYRINGE | INTRAVENOUS | Status: DC | PRN
  Filled 2012-09-28: qty 5

## 2012-09-28 MED ORDER — SODIUM CHLORIDE 0.9 % IJ SOLN: 9.0000 mL | INTRAMUSCULAR | Status: DC | PRN

## 2012-09-28 MED ORDER — LACTATED RINGERS IV SOLN
INTRAVENOUS | Status: DC
  Administered 2012-09-28: 17:00:00 via INTRAVENOUS

## 2012-09-28 MED ORDER — OXYTOCIN 40 UNITS IN LACTATED RINGERS INFUSION - SIMPLE MED
62.5000 mL/h | INTRAVENOUS | Status: DC
  Filled 2012-09-28: qty 1000

## 2012-09-28 MED ORDER — MEPERIDINE HCL 25 MG/ML IJ SOLN
INTRAMUSCULAR | Status: AC
  Administered 2012-09-28: 6.25 mg via INTRAVENOUS
  Filled 2012-09-28: qty 1

## 2012-09-28 MED ORDER — DIPHENHYDRAMINE HCL 25 MG PO CAPS
25.0000 mg | ORAL_CAPSULE | Freq: Four times a day (QID) | ORAL | Status: DC | PRN
  Filled 2012-09-28: qty 1

## 2012-09-28 MED ORDER — IBUPROFEN 600 MG PO TABS
600.0000 mg | ORAL_TABLET | Freq: Four times a day (QID) | ORAL | Status: DC
  Administered 2012-09-28 – 2012-10-01 (×11): 600 mg via ORAL
  Filled 2012-09-28 (×11): qty 1

## 2012-09-28 MED ORDER — MISOPROSTOL 200 MCG PO TABS
ORAL_TABLET | ORAL | Status: AC
  Filled 2012-09-28: qty 5

## 2012-09-28 MED ORDER — ONDANSETRON HCL 4 MG/2ML IJ SOLN: 4.0000 mg | INTRAMUSCULAR | Status: DC | PRN

## 2012-09-28 MED ORDER — PRENATAL MULTIVITAMIN CH
1.0000 | ORAL_TABLET | Freq: Every day | ORAL | Status: DC
  Administered 2012-09-29 – 2012-10-01 (×3): 1 via ORAL
  Filled 2012-09-28 (×4): qty 1

## 2012-09-28 MED ORDER — ONDANSETRON HCL 4 MG/2ML IJ SOLN
INTRAMUSCULAR | Status: AC
  Filled 2012-09-28: qty 2

## 2012-09-28 MED ORDER — PHENYLEPHRINE 40 MCG/ML (10ML) SYRINGE FOR IV PUSH (FOR BLOOD PRESSURE SUPPORT)
PREFILLED_SYRINGE | INTRAVENOUS | Status: AC
  Filled 2012-09-28: qty 5

## 2012-09-28 MED ORDER — LIDOCAINE HCL (PF) 1 % IJ SOLN
INTRAMUSCULAR | Status: DC | PRN
  Administered 2012-09-28 (×2): 5 mL

## 2012-09-28 MED ORDER — CEFAZOLIN SODIUM-DEXTROSE 2-3 GM-% IV SOLR
2.0000 g | Freq: Once | INTRAVENOUS | Status: AC
  Administered 2012-09-28: 2 g via INTRAVENOUS
  Filled 2012-09-28: qty 50

## 2012-09-28 MED ORDER — DIPHENHYDRAMINE HCL 12.5 MG/5ML PO ELIX
12.5000 mg | ORAL_SOLUTION | Freq: Four times a day (QID) | ORAL | Status: DC | PRN
  Filled 2012-09-28: qty 5

## 2012-09-28 MED ORDER — MENTHOL 3 MG MT LOZG: 1.0000 | LOZENGE | OROMUCOSAL | Status: DC | PRN

## 2012-09-28 MED ORDER — EPHEDRINE 5 MG/ML INJ: 10.0000 mg | INTRAVENOUS | Status: DC | PRN

## 2012-09-28 MED ORDER — LIDOCAINE HCL (PF) 1 % IJ SOLN
30.0000 mL | INTRAMUSCULAR | Status: DC | PRN
  Filled 2012-09-28: qty 30

## 2012-09-28 MED ORDER — OXYTOCIN BOLUS FROM INFUSION: 500.0000 mL | INTRAVENOUS | Status: DC

## 2012-09-28 MED ORDER — LACTATED RINGERS IV SOLN: 500.0000 mL | Freq: Once | INTRAVENOUS | Status: DC

## 2012-09-28 MED ORDER — ACETAMINOPHEN 325 MG PO TABS: 650.0000 mg | ORAL_TABLET | ORAL | Status: DC | PRN

## 2012-09-28 MED ORDER — LACTATED RINGERS IV SOLN
INTRAVENOUS | Status: DC
  Administered 2012-09-28 (×3): via INTRAVENOUS

## 2012-09-28 MED ORDER — MISOPROSTOL 100 MCG PO TABS
ORAL_TABLET | ORAL | Status: DC | PRN
  Administered 2012-09-28: 1000 ug via RECTAL

## 2012-09-28 MED ORDER — OXYTOCIN 40 UNITS IN LACTATED RINGERS INFUSION - SIMPLE MED: 62.5000 mL/h | INTRAVENOUS | Status: AC

## 2012-09-28 MED ORDER — LACTATED RINGERS IV SOLN: 500.0000 mL | INTRAVENOUS | Status: DC | PRN

## 2012-09-28 MED ORDER — DIBUCAINE 1 % RE OINT: 1.0000 "application " | TOPICAL_OINTMENT | RECTAL | Status: DC | PRN

## 2012-09-28 MED ORDER — HYDROMORPHONE HCL PF 1 MG/ML IJ SOLN
INTRAMUSCULAR | Status: AC
  Administered 2012-09-28: 0.5 mg via INTRAVENOUS
  Filled 2012-09-28: qty 1

## 2012-09-28 MED ORDER — SENNOSIDES-DOCUSATE SODIUM 8.6-50 MG PO TABS
2.0000 | ORAL_TABLET | Freq: Every day | ORAL | Status: DC
  Administered 2012-09-28 – 2012-09-30 (×2): 2 via ORAL

## 2012-09-28 MED ORDER — OXYCODONE-ACETAMINOPHEN 5-325 MG PO TABS: 1.0000 | ORAL_TABLET | ORAL | Status: DC | PRN

## 2012-09-28 MED ORDER — SODIUM BICARBONATE 8.4 % IV SOLN
INTRAVENOUS | Status: DC | PRN
  Administered 2012-09-28 (×4): 5 mL via EPIDURAL
  Administered 2012-09-28: 3 mL via EPIDURAL

## 2012-09-28 MED ORDER — OXYTOCIN 10 UNIT/ML IJ SOLN
40.0000 [IU] | INTRAVENOUS | Status: DC | PRN
  Administered 2012-09-28: 40 [IU] via INTRAVENOUS

## 2012-09-28 MED ORDER — DIPHENHYDRAMINE HCL 50 MG/ML IJ SOLN: 12.5000 mg | Freq: Four times a day (QID) | INTRAMUSCULAR | Status: DC | PRN

## 2012-09-28 MED ORDER — WITCH HAZEL-GLYCERIN EX PADS: 1.0000 "application " | MEDICATED_PAD | CUTANEOUS | Status: DC | PRN

## 2012-09-28 MED ORDER — FENTANYL 2.5 MCG/ML BUPIVACAINE 1/10 % EPIDURAL INFUSION (WH - ANES)
14.0000 mL/h | INTRAMUSCULAR | Status: DC | PRN
  Administered 2012-09-28: 14 mL/h via EPIDURAL
  Filled 2012-09-28: qty 125

## 2012-09-28 MED ORDER — SIMETHICONE 80 MG PO CHEW
80.0000 mg | CHEWABLE_TABLET | ORAL | Status: DC | PRN
  Administered 2012-09-28: 80 mg via ORAL

## 2012-09-28 MED ORDER — IBUPROFEN 600 MG PO TABS: 600.0000 mg | ORAL_TABLET | Freq: Four times a day (QID) | ORAL | Status: DC | PRN

## 2012-09-28 MED ORDER — CITRIC ACID-SODIUM CITRATE 334-500 MG/5ML PO SOLN
30.0000 mL | ORAL | Status: DC | PRN
  Administered 2012-09-28: 30 mL via ORAL
  Filled 2012-09-28: qty 15

## 2012-09-28 MED ORDER — LANOLIN HYDROUS EX OINT: 1.0000 "application " | TOPICAL_OINTMENT | CUTANEOUS | Status: DC | PRN

## 2012-09-28 MED ORDER — PHENYLEPHRINE 40 MCG/ML (10ML) SYRINGE FOR IV PUSH (FOR BLOOD PRESSURE SUPPORT): 80.0000 ug | PREFILLED_SYRINGE | INTRAVENOUS | Status: DC | PRN

## 2012-09-28 MED ORDER — ONDANSETRON HCL 4 MG/2ML IJ SOLN
INTRAMUSCULAR | Status: DC | PRN
  Administered 2012-09-28: 4 mg via INTRAVENOUS

## 2012-09-28 MED ORDER — PHENYLEPHRINE HCL 10 MG/ML IJ SOLN
INTRAMUSCULAR | Status: DC | PRN
  Administered 2012-09-28 (×9): 40 ug via INTRAVENOUS

## 2012-09-28 MED ORDER — DIPHENHYDRAMINE HCL 50 MG/ML IJ SOLN: 12.5000 mg | INTRAMUSCULAR | Status: DC | PRN

## 2012-09-28 MED ORDER — BUPRENORPHINE HCL 8 MG SL SUBL
8.0000 mg | SUBLINGUAL_TABLET | Freq: Every day | SUBLINGUAL | Status: DC
  Administered 2012-09-28: 8 mg via SUBLINGUAL
  Filled 2012-09-28: qty 1

## 2012-09-28 MED ORDER — SIMETHICONE 80 MG PO CHEW
80.0000 mg | CHEWABLE_TABLET | Freq: Three times a day (TID) | ORAL | Status: DC
  Administered 2012-09-29 – 2012-10-01 (×8): 80 mg via ORAL

## 2012-09-28 MED ORDER — ONDANSETRON HCL 4 MG PO TABS: 4.0000 mg | ORAL_TABLET | ORAL | Status: DC | PRN

## 2012-09-28 MED ORDER — FENTANYL CITRATE 0.05 MG/ML IJ SOLN
INTRAMUSCULAR | Status: AC
  Filled 2012-09-28: qty 2

## 2012-09-28 MED ORDER — TETANUS-DIPHTH-ACELL PERTUSSIS 5-2.5-18.5 LF-MCG/0.5 IM SUSP
0.5000 mL | Freq: Once | INTRAMUSCULAR | Status: AC
  Administered 2012-09-30: 0.5 mL via INTRAMUSCULAR

## 2012-09-28 MED ORDER — MEPERIDINE HCL 25 MG/ML IJ SOLN
6.2500 mg | INTRAMUSCULAR | Status: DC | PRN
  Administered 2012-09-28: 6.25 mg via INTRAVENOUS

## 2012-09-28 MED ORDER — HYDROMORPHONE HCL PF 1 MG/ML IJ SOLN
0.2500 mg | INTRAMUSCULAR | Status: DC | PRN
  Administered 2012-09-28 (×4): 0.5 mg via INTRAVENOUS

## 2012-09-28 MED ORDER — LIDOCAINE-EPINEPHRINE (PF) 2 %-1:200000 IJ SOLN
INTRAMUSCULAR | Status: AC
  Filled 2012-09-28: qty 20

## 2012-09-28 MED ORDER — EPHEDRINE 5 MG/ML INJ
10.0000 mg | INTRAVENOUS | Status: DC | PRN
  Filled 2012-09-28: qty 4

## 2012-09-28 MED ORDER — 0.9 % SODIUM CHLORIDE (POUR BTL) OPTIME
TOPICAL | Status: DC | PRN
  Administered 2012-09-28: 1000 mL

## 2012-09-28 MED ORDER — HYDROMORPHONE 0.3 MG/ML IV SOLN
INTRAVENOUS | Status: DC
  Administered 2012-09-28: 16:00:00 via INTRAVENOUS
  Administered 2012-09-28: 6.59 mg via INTRAVENOUS
  Administered 2012-09-29: 02:00:00 via INTRAVENOUS
  Administered 2012-09-29: 5.9 mg via INTRAVENOUS
  Filled 2012-09-28 (×3): qty 25

## 2012-09-28 MED ORDER — SODIUM BICARBONATE 8.4 % IV SOLN
INTRAVENOUS | Status: AC
  Filled 2012-09-28: qty 50

## 2012-09-28 MED ORDER — FENTANYL CITRATE 0.05 MG/ML IJ SOLN
INTRAMUSCULAR | Status: DC | PRN
  Administered 2012-09-28: 50 ug via INTRAVENOUS

## 2012-09-28 SURGICAL SUPPLY — 29 items
BENZOIN TINCTURE PRP APPL 2/3 (GAUZE/BANDAGES/DRESSINGS) ×2 IMPLANT
CLAMP CORD UMBIL (MISCELLANEOUS) IMPLANT
CLIP FILSHIE TUBAL LIGA STRL (Clip) ×4 IMPLANT
CONTAINER PREFILL 10% NBF 15ML (MISCELLANEOUS) IMPLANT
DRAPE LG THREE QUARTER DISP (DRAPES) ×2 IMPLANT
DRSG OPSITE POSTOP 4X10 (GAUZE/BANDAGES/DRESSINGS) ×2 IMPLANT
DURAPREP 26ML APPLICATOR (WOUND CARE) ×2 IMPLANT
ELECT REM PT RETURN 9FT ADLT (ELECTROSURGICAL) ×2
ELECTRODE REM PT RTRN 9FT ADLT (ELECTROSURGICAL) ×1 IMPLANT
EXTRACTOR VACUUM M CUP 4 TUBE (SUCTIONS) IMPLANT
GLOVE BIOGEL PI IND STRL 6.5 (GLOVE) ×3 IMPLANT
GLOVE BIOGEL PI INDICATOR 6.5 (GLOVE) ×3
GLOVE SURG SS PI 6.0 STRL IVOR (GLOVE) ×4 IMPLANT
GOWN STRL REIN XL XLG (GOWN DISPOSABLE) ×4 IMPLANT
KIT ABG SYR 3ML LUER SLIP (SYRINGE) IMPLANT
NEEDLE HYPO 25X5/8 SAFETYGLIDE (NEEDLE) IMPLANT
NS IRRIG 1000ML POUR BTL (IV SOLUTION) ×2 IMPLANT
PACK C SECTION WH (CUSTOM PROCEDURE TRAY) ×2 IMPLANT
PAD OB MATERNITY 4.3X12.25 (PERSONAL CARE ITEMS) ×2 IMPLANT
RTRCTR C-SECT PINK 25CM LRG (MISCELLANEOUS) ×2 IMPLANT
SEPRAFILM MEMBRANE 5X6 (MISCELLANEOUS) IMPLANT
STAPLER VISISTAT 35W (STAPLE) IMPLANT
STRIP CLOSURE SKIN 1/2X4 (GAUZE/BANDAGES/DRESSINGS) ×2 IMPLANT
SUT PLAIN 0 NONE (SUTURE) IMPLANT
SUT VIC AB 0 CT1 36 (SUTURE) ×8 IMPLANT
SUT VIC AB 4-0 KS 27 (SUTURE) ×2 IMPLANT
TOWEL OR 17X24 6PK STRL BLUE (TOWEL DISPOSABLE) ×2 IMPLANT
TRAY FOLEY CATH 14FR (SET/KITS/TRAYS/PACK) IMPLANT
WATER STERILE IRR 1000ML POUR (IV SOLUTION) ×2 IMPLANT

## 2012-09-28 NOTE — Anesthesia Preprocedure Evaluation (Signed)
Anesthesia Evaluation  Patient identified by MRN, date of birth, ID band Patient awake    Reviewed: Allergy & Precautions, H&P , Patient's Chart, lab work & pertinent test results  Airway Mallampati: II TM Distance: >3 FB Neck ROM: full    Dental no notable dental hx.    Pulmonary neg pulmonary ROS,  breath sounds clear to auscultation  Pulmonary exam normal       Cardiovascular hypertension, negative cardio ROS  Rhythm:regular Rate:Normal     Neuro/Psych negative neurological ROS  negative psych ROS   GI/Hepatic negative GI ROS, Neg liver ROS,   Endo/Other  negative endocrine ROS  Renal/GU negative Renal ROS     Musculoskeletal   Abdominal   Peds  Hematology negative hematology ROS (+)   Anesthesia Other Findings  Anxiety     Pregnant        Migraine     Chronic back pain    Reproductive/Obstetrics (+) Pregnancy                           Anesthesia Physical Anesthesia Plan  ASA: III  Anesthesia Plan: Epidural   Post-op Pain Management:    Induction:   Airway Management Planned:   Additional Equipment:   Intra-op Plan:   Post-operative Plan:   Informed Consent: I have reviewed the patients History and Physical, chart, labs and discussed the procedure including the risks, benefits and alternatives for the proposed anesthesia with the patient or authorized representative who has indicated his/her understanding and acceptance.     Plan Discussed with:   Anesthesia Plan Comments:         Anesthesia Quick Evaluation

## 2012-09-28 NOTE — Transfer of Care (Signed)
Immediate Anesthesia Transfer of Care Note  Patient: Marcia Hood  Procedure(s) Performed: Procedure(s): Primary cesarean section with delivery of baby girl at 93.  (N/A)  Patient Location: PACU  Anesthesia Type:Epidural  Level of Consciousness: awake and alert   Airway & Oxygen Therapy: Patient Spontanous Breathing  Post-op Assessment: Report given to PACU RN and Post -op Vital signs reviewed and stable  Post vital signs: Reviewed and stable  Complications: No apparent anesthesia complications

## 2012-09-28 NOTE — Progress Notes (Signed)
Pt c/o of continuous pain, not well controlled by full does PCA and a total of three 0.5mg  boluses per order. Explained to patient strength of medications and likelihood of low pain threshold given history of substance use. Encouraged Pt to use pump when indicated and attempted to reposition patient. Pt refuses to ambulate at time due to pain--explained benefits and risks of late ambulation. Will monitor. Otherwise, VSS, bleeding small, and dressing CDI.

## 2012-09-28 NOTE — H&P (Signed)
Marcia Hood is a 32 y.o. female G3P2002 with IUP at [redacted]w[redacted]d presenting for labor.   Pt has been having regular contractions since around 4:30 this AM.  States they have gotten stronger. +FM, No LOF, No VB.    [facility] PT has a hx of substance abuse and was positive fo cocaine in 1st trimester. She is also on suboxone.  Pt also has a hx of cHTN per chart although took herself off meds prior to pregnancy and has had no elevated pressures while pregnant.     Past Medical History: Past Medical History  Diagnosis Date  . Hypertension   . Depression   . Anxiety   . Pregnant   . Migraine   . Chronic back pain     Past Surgical History: Past Surgical History  Procedure Laterality Date  . No past surgeries      Obstetrical History: OB History   Grav Para Term Preterm Abortions TAB SAB Ect Mult Living   3 2 2  0 0 0 0 0 0 2     Social History: History   Social History  . Marital Status: Married    Spouse Name: N/A    Number of Children: N/A  . Years of Education: N/A   Social History Main Topics  . Smoking status: Former Smoker -- 0.50 packs/day for 10 years    Types: Cigarettes  . Smokeless tobacco: None  . Alcohol Use: No  . Drug Use: Yes    Special: Benzodiazepines, Cocaine, Amphetamines     Comment: on Suboxone 8 mg; no other drugs.  . Sexual Activity: Yes    Birth Control/ Protection: None   Other Topics Concern  . None   Social History Narrative  . None    Family History: Family History  Problem Relation Age of Onset  . Anesthesia problems Neg Hx   . Hypertension Mother   . Diabetes Father   . Heart attack Maternal Grandmother   . Cancer Maternal Grandmother     Bladder    Allergies: Allergies  Allergen Reactions  . Penicillins Rash    Prescriptions prior to admission  Medication Sig Dispense Refill  . acetaminophen (TYLENOL) 325 MG tablet Take 650 mg by mouth every 6 (six) hours as needed for pain.      .  Buprenorphine HCl-Naloxone HCl (SUBOXONE) 8-2 MG FILM Place 16 each under the tongue daily. Takes 16 mg /day thru treatment  Center. Goes daily for dosing.  On days she doesn't drive to center , she takes one of Husband's suboxone. (?))jvferg      . buprenorphine-naloxone (SUBOXONE) 8-2 MG SUBL SL tablet Place under the tongue.      . calcium carbonate (TUMS - DOSED IN MG ELEMENTAL CALCIUM) 500 MG chewable tablet Chew 1 tablet by mouth 2 (two) times daily as needed. For heartburn      . FLUoxetine (PROZAC) 20 MG capsule Take 20 mg by mouth daily.      . ondansetron (ZOFRAN) 4 MG tablet Take 1 tablet (4 mg total) by mouth every 6 (six) hours.  12 tablet  0  . Prenatal Vit-Fe Fumarate-FA (PRENATAL MULTIVITAMIN) TABS Take 1 tablet by mouth daily.      . promethazine (PHENERGAN) 25 MG tablet Take 1 tablet (25 mg total) by mouth every 6 (six) hours as needed for nausea.  30 tablet  0  . zolpidem (AMBIEN) 10 MG tablet Take 10 mg by mouth at bedtime as  needed for sleep.          Review of Systems   Constitutional: Negative for fever, chills, weight loss, malaise/fatigue and diaphoresis.  HENT: Negative for hearing loss, ear pain, nosebleeds, congestion, sore throat, neck pain, tinnitus and ear discharge.   Eyes: Negative for blurred vision, double vision, photophobia, pain, discharge and redness.  Respiratory: Negative for cough, hemoptysis, sputum production, shortness of breath, wheezing and stridor.   Cardiovascular: Negative for chest pain, palpitations, orthopnea,  leg swelling  Gastrointestinal: Positive for abdominal pain. Negative for heartburn, nausea, vomiting, diarrhea, constipation, blood in stool Genitourinary: Negative for dysuria, urgency, frequency, hematuria and flank pain.  Musculoskeletal: Negative for myalgias, back pain, joint pain and falls.  Skin: Negative for itching and rash.  Neurological: Negative for dizziness, tingling, tremors, sensory change, speech change, focal  weakness, seizures, loss of consciousness, weakness and headaches.  Endo/Heme/Allergies: Negative for environmental allergies and polydipsia. Does not bruise/bleed easily.  Psychiatric/Behavioral: Negative for depression, suicidal ideas, hallucinations, memory loss and substance abuse. The patient is not nervous/anxious and does not have insomnia.       Blood pressure 135/81, pulse 66, temperature 97.4 F (36.3 C), temperature source Oral, resp. rate 22, height 5\' 4"  (1.626 m), weight 72.122 kg (159 lb), last menstrual period 12/25/2011. General appearance: alert and mild distress Lungs: clear to auscultation bilaterally Heart: regular rate and rhythm Abdomen: soft, gravid; bowel sounds normal Extremities: Homans sign is negative, no sign of DVT DTR's 2+ Presentation: cephalic Fetal monitoringBaseline: 120 bpm, Variability: Good {> 6 bpm), Accelerations: Reactive and Decelerations: Absent Uterine activity every 2-3 min Dilation: 6 Effacement (%): 100 Station: -1 Exam by:: T. Sprague RN   Prenatal labs: ABO, Rh:   Antibody: NEG (06/16 0901) Rubella:   RPR: NON REAC (06/16 0901)  HBsAg:    HIV: NON REACTIVE (06/16 0901)  GBS: Negative (08/08 0000)  2 hr Glucola 88/120/78 Genetic screening  Normal NT Anatomy US normal    Assessment: Marcia Hood is a 32 y.o. [id number] with an IUP at [redacted]w[redacted]d presenting for labor  Plan: Admit to L&D Routine orders Epidural prn Send UDS Monitor bp on L&D  FWB- cat I tracing. NICU notified given suboxone use. EFW 6.5#  Anticipate SVD    Roneka Gilpin L, MD 09/28/2012, 6:51 AM

## 2012-09-28 NOTE — Progress Notes (Signed)
Patient ID: Marcia Hood, female   DOB: 1980-12-15, 32 y.o.   MRN: [id number] Notified by CNM of fetal breech presentation in patient who is entering second stage of labor. Discussed delivery via primary cesarean section. Risks, benefits and alternatives were explained including but not limited to risks of bleeding, infection and damage to adjacent organs. Patient verbalized understanding and all questions were answered. Patient also desires permanent sterilization. Risks and benefits of procedure discussed with patient including permanence of method, bleeding, infection, injury to surrounding organs and need for additional procedures. Risk failure of 0.5-1% with increased risk of ectopic gestation if pregnancy occurs was also discussed with patient.

## 2012-09-28 NOTE — Progress Notes (Signed)
Patient ID: Marcia Hood, female   DOB: 18-Apr-1980, 32 y.o.   MRN: [id number]  32 y.o. [id number] @[redacted]w[redacted]d  admitted for SOL.  Called to room by RN following cervical exam.  Pt 10/100/1, RN concerned fetal position is breech.  U/S confirmed breech position. Pt feeling some mild rectal pressure at this time.  FHR tracing Category Dr Jolayne Panther to bedside to consent pt for C/S. Ancef 2 g IV ordered.  Pt prepped for OR.   Sharen Counter Certified Nurse-Midwife

## 2012-09-28 NOTE — Anesthesia Procedure Notes (Signed)
Epidural Patient location during procedure: OB Start time: 09/28/2012 7:04 AM  Staffing Anesthesiologist: Angus Seller., Harrell Gave. Performed by: anesthesiologist   Preanesthetic Checklist Completed: patient identified, site marked, surgical consent, pre-op evaluation, timeout performed, IV checked, risks and benefits discussed and monitors and equipment checked  Epidural Patient position: sitting Prep: site prepped and draped and DuraPrep Patient monitoring: continuous pulse ox and blood pressure Approach: midline Injection technique: LOR air  Needle:  Needle type: Tuohy  Needle gauge: 17 G Needle length: 9 cm and 9 Needle insertion depth: 5 cm cm Catheter type: closed end flexible Catheter size: 19 Gauge Catheter at skin depth: 10 cm Test dose: negative  Assessment Events: blood not aspirated, injection not painful, no injection resistance, negative IV test and no paresthesia  Additional Notes Patient identified.  Risk benefits discussed including failed block, incomplete pain control, headache, nerve damage, paralysis, blood pressure changes, nausea, vomiting, reactions to medication both toxic or allergic, and postpartum back pain.  Patient expressed understanding and wished to proceed.  All questions were answered.  Sterile technique used throughout procedure and epidural site dressed with sterile barrier dressing. No paresthesia or other complications noted.The patient did not experience any signs of intravascular injection such as tinnitus or metallic taste in mouth nor signs of intrathecal spread such as rapid motor block. Please see nursing notes for vital signs.

## 2012-09-28 NOTE — OR Nursing (Signed)
Foley catheter in place upon arrival to OR.  Uterus massaged by S. Shyhiem Beeney Charity fundraiser. 1400 cc evacuated from uterus during uterine massage. Two tubes of cord blood sent to lab.

## 2012-09-28 NOTE — OR Nursing (Signed)
Filshie clips applied to right and left fallopian tubes by Dr. Chana Bode on 09/28/2012.   Lot number [id number].  Expiration date 2017-02.  Manufacture -[employer].

## 2012-09-28 NOTE — Op Note (Addendum)
Marcia Hood PROCEDURE DATE: 09/28/2012  PREOPERATIVE DIAGNOSIS: Intrauterine pregnancy at  [redacted]w[redacted]d weeks gestation; malpresentation: breech entering second stage of labor  POSTOPERATIVE DIAGNOSIS: The same  PROCEDURE:     Cesarean Section and bilateral tubal ligation with Filshie Clips  SURGEON:  Dr. Catalina Antigua  ASSISTANT: none  INDICATIONS: Marcia Hood is a 32 y.o. [id number] at [redacted]w[redacted]d scheduled for cesarean section secondary to malpresentation: breech entering second stage of labor.  The risks of cesarean section discussed with the patient included but were not limited to: bleeding which may require transfusion or reoperation; infection which may require antibiotics; injury to bowel, bladder, ureters or other surrounding organs; injury to the fetus; need for additional procedures including hysterectomy in the event of a life-threatening hemorrhage; placental abnormalities wth subsequent pregnancies, incisional problems, thromboembolic phenomenon and other postoperative/anesthesia complications. Patient also desires permanent sterilization. Risks and benefits of procedure discussed with patient including permanence of method, bleeding, infection, injury to surrounding organs and need for additional procedures. Risk failure of 0.5-1% with increased risk of ectopic gestation if pregnancy occurs was also discussed with patient.The patient concurred with the proposed plan, giving informed written consent for the procedure.    FINDINGS:  Viable female infant in cephalic presentation.  Apgars 9 and 9.  Clear amniotic fluid.  Intact placenta, three vessel cord.  Normal uterus, fallopian tubes and ovaries bilaterally.  ANESTHESIA:    Epiidural INTRAVENOUS FLUIDS:2700 ml ESTIMATED BLOOD LOSS: 500 ml URINE OUTPUT:  50 ml SPECIMENS: Placenta sent to L&D COMPLICATIONS: None immediate  PROCEDURE IN DETAIL:  The patient received intravenous antibiotics and had sequential compression devices applied  to her lower extremities while in the preoperative area.  She was then taken to the operating room where anesthesia was induced and was found to be adequate. A foley catheter was placed into her bladder and attached to Thurza Kwiecinski gravity. She was then placed in a dorsal supine position with a leftward tilt, and prepped and draped in a sterile manner. After an adequate timeout was performed, a Pfannenstiel skin incision was made with scalpel and carried through to the underlying layer of fascia. The fascia was incised in the midline and this incision was extended bilaterally using the Mayo scissors. Kocher clamps were applied to the superior aspect of the fascial incision and the underlying rectus muscles were dissected off bluntly. A similar process was carried out on the inferior aspect of the facial incision. The rectus muscles were separated in the midline bluntly and the peritoneum was entered bluntly. The Alexis self-retaining retractor was introduced into the abdominal cavity. Attention was turned to the lower uterine segment where a bladder flap was created, and a transverse hysterotomy was made with a scalpel and extended bilaterally bluntly. The infant was successfully delivered, and cord was clamped and cut and infant was handed over to awaiting neonatology team. Uterine massage was then administered and the placenta delivered intact with three-vessel cord. The uterus was cleared of clot and debris.  The hysterotomy was closed with 0 Vicryl in a running locked fashion, and an imbricating layer was also placed with a 0 Vicryl. Overall, excellent hemostasis was noted. The pelvis copiously irrigated and cleared of all clot and debris. Hemostasis was confirmed on all surfaces. The patient's left fallopian tube was then identified, brought to the incision, and grasped with a Babcock clamp. The tube was then followed out to the fimbria. The Babcock clamp was then used to grasp the tube approximately 4 cm from the  cornual region.  A Filshie clip was applied. The right fallopian tube was then identified to its fimbriated end, a Flishie clip was applied in a similar fashion. Excellent hemostasis was noted, and the tube returned to the abdomen. The peritoneum and the muscles were reapproximated using 0 vicryl interrupted stitches. The fascia was then closed using 0 Vicryl in a running locked fashion.  The subcutaneous layer was reapproximated with plain gut and the skin was closed in a subcuticular fashion using 3.0 Vicryl. The patient tolerated the procedure well. Sponge, lap, instrument and needle counts were correct x 2. She was taken to the recovery room in stable condition.    Marcia Hood,PEGGYMD  09/28/2012 11:31 AM Prior to transfer to recovery room, patient noted to have heavy vaginal bleeding. Uterus noted to be atonic with good response to bimanual massage. Total EBL 1200 cc. 1000 mcg Cytotec administered per rectum.

## 2012-09-28 NOTE — MAU Note (Signed)
PT  CRYING AND HOLLERING IN LOBBY-  BROUGHT BACK TO  RM 4 WITH W/C.   FHR-  156.  VE   -  6, 80 %, -2, INTACT,   NO BLEEDING.     TO  RM 169 VIA STRETCHER.

## 2012-09-28 NOTE — Anesthesia Postprocedure Evaluation (Signed)
  Anesthesia Post-op Note  Patient: Marcia Hood  Procedure(s) Performed: Procedure(s): Primary cesarean section with delivery of baby girl at 64.  (N/A)  Patient is awake, responsive, moving her legs, and has signs of resolution of her numbness. Pain and nausea are reasonably well controlled. Vital signs are stable and clinically acceptable. Oxygen saturation is clinically acceptable. There are no apparent anesthetic complications at this time. Patient is ready for discharge.

## 2012-09-29 LAB — CBC
HCT: 19.5 % — ABNORMAL LOW (ref 36.0–46.0)
Hemoglobin: 6.4 g/dL — CL (ref 12.0–15.0)
MCH: 28.6 pg (ref 26.0–34.0)
MCHC: 32.8 g/dL (ref 30.0–36.0)
MCV: 87.1 fL (ref 78.0–100.0)
Platelets: 187 10*3/uL (ref 150–400)
RBC: 2.24 MIL/uL — ABNORMAL LOW (ref 3.87–5.11)
RDW: 14 % (ref 11.5–15.5)
WBC: 17.5 10*3/uL — ABNORMAL HIGH (ref 4.0–10.5)

## 2012-09-29 MED ORDER — ZOLPIDEM TARTRATE 5 MG PO TABS
5.0000 mg | ORAL_TABLET | Freq: Once | ORAL | Status: AC
  Administered 2012-09-29: 5 mg via ORAL
  Filled 2012-09-29: qty 1

## 2012-09-29 MED ORDER — BUPRENORPHINE HCL 8 MG SL SUBL
8.0000 mg | SUBLINGUAL_TABLET | Freq: Every day | SUBLINGUAL | Status: DC
  Administered 2012-09-29 – 2012-10-01 (×3): 8 mg via SUBLINGUAL
  Filled 2012-09-29 (×3): qty 1

## 2012-09-29 MED ORDER — OXYCODONE-ACETAMINOPHEN 5-325 MG PO TABS
1.0000 | ORAL_TABLET | Freq: Four times a day (QID) | ORAL | Status: DC | PRN
  Administered 2012-09-29 – 2012-09-30 (×5): 2 via ORAL
  Administered 2012-09-30: 1 via ORAL
  Administered 2012-10-01 (×2): 2 via ORAL
  Filled 2012-09-29 (×8): qty 2

## 2012-09-29 MED ORDER — PNEUMOCOCCAL VAC POLYVALENT 25 MCG/0.5ML IJ INJ
0.5000 mL | INJECTION | INTRAMUSCULAR | Status: AC
  Administered 2012-09-30: 0.5 mL via INTRAMUSCULAR
  Filled 2012-09-29: qty 0.5

## 2012-09-29 NOTE — Progress Notes (Signed)
Subjective: Postpartum Day 1: Cesarean Delivery Patient reports tolerating PO.  Reports pain relief with PCA pump.  No questions or concerns.  Objective: Vital signs in last 24 hours: Temp:  [97.5 F (36.4 C)-100.1 F (37.8 C)] 97.7 F (36.5 C) (08/31 0550) Pulse Rate:  [61-156] 76 (08/31 0550) Resp:  [16-24] 18 (08/31 0550) BP: (72-138)/(35-97) 100/68 mmHg (08/31 0550) SpO2:  [90 %-100 %] 96 % (08/31 0217)  Physical Exam:  General: alert, cooperative and appears stated age CVS:  RRR, without murmur, gallops, or rubs Lungs:  CTA bilat ABD:  +BSx4, normal Lochia: appropriate Uterine Fundus: firm Incision: no significant drainage, dressing overall clean, dry, and intact; small areas of discharge not extended over outlined borders.   DVT Evaluation: No evidence of DVT seen on physical exam. Negative Homan's sign.   Recent Labs  09/28/12 0555  HGB 10.2*  HCT 31.3*    Assessment/Plan: Status post Cesarean section. Doing well postoperatively.  Continue current care.  [facility] 09/29/2012, 7:00 AM

## 2012-09-29 NOTE — Progress Notes (Addendum)
rec'd critical lab valve from lab 6.4 hgb  Notified on coming nurse states she will  contact attending physican.

## 2012-09-29 NOTE — Clinical Social Work Note (Signed)
Clinical Social Work Department PSYCHOSOCIAL ASSESSMENT - MATERNAL/CHILD 09/29/2012  Patient:  Marcia Hood, Marcia Hood  Account Number:  [id number]  Admit Date:  09/28/2012  Marcia Bicker Name:   Flavia Hood    Clinical Social Worker:  Truman Hayward, LCSW   Date/Time:  09/29/2012 01:00 PM  Date Referred:  09/29/2012   Referral source  Physician  RN     Referred reason  Substance Abuse   Other referral source:    I:  FAMILY / HOME ENVIRONMENT Child's legal guardian:  PARENT  Guardian - Name Guardian - Age Guardian - Address  Marcia Hood 32 [address] [place], [place] [address]  Marcia Hood  [address] [place], [place] [address]   Other household support members/support persons Name Relationship DOB  58 month old     Other support:   MOB reports good family support    II  PSYCHOSOCIAL DATA Information Source:  Patient Interview  Event organiser Employment:   Surveyor, quantity resources:  [employer] If Medicaid - County:  [employer] Other  [facility]  Food Stamps   School / Grade:   Maternity Care Coordinator / Child Services Coordination / Early Interventions:  Cultural issues impacting care:    III  STRENGTHS Strengths  Adequate Resources  Home prepared for Child (including basic supplies)  Supportive family/friends   Strength comment:    IV  RISK FACTORS AND CURRENT PROBLEMS Current Problem:  YES   Risk Factor & Current Problem Patient Issue Family Issue Risk Factor / Current Problem Comment  Substance Abuse Y N MOB positive for cocaine   N N     V  SOCIAL WORK ASSESSMENT CSW spoke with MOB about hx of SA abuse.  MOB reports she can not remember the first age she tried cocaine, however that he only used is socially and maybe once every 3 months.  MOB reported last time she used was around new years right before she found out she was pregnant.  CSW discussed positive drug screen from the day before for cocaine.  MOB reports she is unaware of how this got in her  system.  CSW discussed MOB that she would have had to injested this somehow and she reported some friends had done it a week ago while she was with them, however she has not had any use since first finding out she was pregnant. CSW discussed hospital policy to contact DSS.  MOB was tearful but understanding.  CSW discussed home environment. MOB reports living with her husband and 63month old.  MOB reports having an 30 yo living with her previous spouse. MOB reports taking suboxone for pain management.  MOB does not report any hx of opiate addiction.  MOB reports she used to get medication from [facility], however she now obtains medication from her friends or the "streets".   CSW discussed DSS being a support for treatment and financial resources.  CSW contacted DSS to report positive drug screen.  DSS discussed coming to see MOB later this evening.  CSW informed MOB and RN that DSS would come speak with MOB sometime today.  CSW continues to follow to assist with discharge planning.      VI SOCIAL WORK PLAN  Type of pt/family education:   If child protective services report - county:   If child protective services report - date:   Information/referral to community resources comment:   Other social work plan:

## 2012-09-29 NOTE — Progress Notes (Signed)
Dilaudid PCA discontinued. Full syringe used at time of d/c and nothing to waste. Empty PCA and tubing in black box.

## 2012-09-29 NOTE — Anesthesia Postprocedure Evaluation (Signed)
  Anesthesia Post-op Note  Patient: Marcia Hood  Procedure(s) Performed: Procedure(s): Primary cesarean section with delivery of baby girl at 18.  (N/A)  Patient Location: Mother/Baby  Anesthesia Type:Epidural  Level of Consciousness: awake, alert  and oriented  Airway and Oxygen Therapy: Patient Spontanous Breathing  Post-op Pain: mild  Post-op Assessment: Post-op Vital signs reviewed, Patient's Cardiovascular Status Stable, No headache, No backache, No residual numbness and No residual motor weakness  Post-op Vital Signs: Reviewed and stable  Complications: No apparent anesthesia complications

## 2012-09-30 ENCOUNTER — Encounter (HOSPITAL_COMMUNITY): Payer: Self-pay | Admitting: *Deleted

## 2012-09-30 NOTE — Progress Notes (Signed)
UR chart review completed.  

## 2012-09-30 NOTE — Progress Notes (Signed)
[place] CPS worker, Marcia Hood met with pt yesterday evening & completed a safety assessment. A copy was placed in infants paper chart. No barriers to discharge.      

## 2012-09-30 NOTE — Progress Notes (Signed)
Subjective: Postpartum Day 2: Cesarean Delivery Patient reports incisional pain, tolerating PO, + flatus, + BM and no problems voiding.    Objective: Vital signs in last 24 hours: Temp:  [98.1 F (36.7 C)-98.6 F (37 C)] 98.1 F (36.7 C) (09/01 0553) Pulse Rate:  [73-88] 73 (09/01 0553) Resp:  [18-20] 18 (09/01 0553) BP: (125-131)/(68-78) 125/68 mmHg (09/01 0553) SpO2:  [98 %-99 %] 98 % (09/01 0553)  Physical Exam:  General: alert, cooperative, appears stated age and no distress Lochia: appropriate Uterine Fundus: firm Incision: healing well, no significant drainage, no dehiscence, no significant erythema DVT Evaluation: No evidence of DVT seen on physical exam. Negative Homan's sign. No cords or calf tenderness. No significant calf/ankle edema.   Recent Labs  09/28/12 0555 09/29/12 0630  HGB 10.2* 6.4*  HCT 31.3* 19.5*    Assessment/Plan: Status post Cesarean section. Doing well postoperatively.  Continue current care Follow up social work tomorrow D/c in AM.  Tawana Scale 09/30/2012, 7:11 AM

## 2012-10-01 MED ORDER — IBUPROFEN 600 MG PO TABS: 600.0000 mg | ORAL_TABLET | Freq: Four times a day (QID) | ORAL | Status: DC

## 2012-10-01 MED ORDER — OXYCODONE-ACETAMINOPHEN 5-325 MG PO TABS: 1.0000 | ORAL_TABLET | Freq: Four times a day (QID) | ORAL | Status: DC | PRN

## 2012-10-01 NOTE — Discharge Summary (Signed)
Obstetric Discharge Summary  Marcia Hood is a 32 yo G3P3003 POD#3 s/p PLTCS (for breech) with BTL done during procedure. She is doing well and has controlled pain with Ibuprofen, Percocet and Suboxone. She plans on bottle feeding her daughter.  Reason for Admission: onset of labor Prenatal Procedures: ultrasound Intrapartum Procedures: cesarean: low cervical, transverse and tubal ligation (breech pesentation) Postpartum Procedures: none Complications-Operative and Postpartum: hemorrhage of 1200 cc. Social work consulted for substance abuse (cocaine + UDS) and usage of Suboxone and was cleared for D/C.  Hemoglobin  Date Value Range Status  09/29/2012 6.4* 12.0 - 15.0 g/dL Final     REPEATED TO VERIFY     CRITICAL RESULT CALLED TO, READ BACK BY AND VERIFIED WITH:     S PEACE 0710 09/29/12 BY A POTEAT     DELTA CHECK NOTED     HCT  Date Value Range Status  09/29/2012 19.5* 36.0 - 46.0 % Final    Physical Exam:  General: alert, cooperative and no distress Lochia: appropriate Uterine Fundus: firm, with mild TTP Incision: healing well, no significant drainage, no significant erythema DVT Evaluation: No evidence of DVT seen on physical exam. Negative Homan's sign. No significant calf/ankle edema.  Discharge Diagnoses: Term Pregnancy-delivered  Discharge Information: Date: 10/01/2012 Activity: unrestricted Diet: routine Medications: Ibuprofen and Iron. Will defer on rx of percocet given pt uses suboxone for back pain currently  Condition: stable Instructions: refer to practice specific booklet Discharge to: home Follow-up Information   Follow up with [facility] In 4 weeks.   Contact information:   [address] [name] [address] [phone]      Newborn Data: Live born female  Birth Weight: 7 lb 12.7 oz (3535 g) APGAR: 8, 9  Home with mother.  Marcia Hood 10/01/2012, 9:38 AM  I have seen and examined this patient and agree with above  documentation in the PA student's note. Pt cleared by SW to take baby home. Will d/c without narcotic rx given suboxone use.    Rulon Abide, M.D. [facility] Fellow 10/01/2012 12:37 PM

## 2012-10-01 NOTE — Progress Notes (Signed)
Post Op Day #3 Subjective: [name] feels ready to go home. She describes abdominal / incisional pain as 6/10, but adequately controlled with Motrin and Percocet Q6hrs. She denies dizziness, nausea, vomiting and chest pain. She describes decreasing bleeding which is, "very light". She has been active walking and showering. The patient is urinating well, has had flatus, but has not had a bowel movement. BTL was done the same day as her C-Section and she plans on bottle feeling her daughter.  Objective: Blood pressure 132/87, pulse 71, temperature 98 F (36.7 C), temperature source Oral, resp. rate 18, height 5\' 4"  (1.626 m), weight 72.122 kg (159 lb), last menstrual period 12/25/2011, SpO2 98.00%, unknown if currently breastfeeding.  Physical Exam:  General: alert, cooperative and no distress Lochia: appropriate Uterine Fundus: firm.  Abdomen is moderately TTP. Incision: healing well, no significant drainage, no significant erythema DVT Evaluation: No evidence of DVT seen on physical exam. Negative Homan's sign. No significant calf/ankle edema.    Recent Labs  09/29/12 0630  HGB 6.4*  HCT 19.5*    Assessment/Plan: Marcia Hood is a 32 year old G3P3003 POD#3 s/p PLTCS (for breech) with BTL done during procedure. She is doing well and is bottle feeding her daughter. Dispo: D/C POD #3, remove staples prior to D/C. Add Ferrous Sulfate to discharge medications to take for PPH of ~1200cc and HGB of 6.4.    LOS: 3 days   LICHTENSTEIN, JONATHAN 10/01/2012, 7:24 AM   I have seen and examined this patient and agree with above documentation in the PA student's note. Pt with hgb of 6.4 but asymptomatic. Recommend outpatient iron. See d/c summary from today for further details.   Rulon Abide, M.D. [facility] Fellow 10/01/2012 12:38 PM

## 2012-10-01 NOTE — Discharge Summary (Signed)
Attestation of Attending Supervision of Fellow: Evaluation and management procedures were performed by the Fellow under my supervision and collaboration.  I have reviewed the Fellow's note and chart, and I agree with the management and plan.    

## 2012-10-02 LAB — RUBELLA SCREEN: Rubella: 0.91 Index — ABNORMAL HIGH (ref <0.90)

## 2012-10-04 ENCOUNTER — Encounter: Payer: Non-veteran care | Admitting: Women's Health

## 2012-10-15 ENCOUNTER — Telehealth: Payer: Self-pay | Admitting: *Deleted

## 2012-10-15 NOTE — Telephone Encounter (Signed)
Marcia Hood from [facility] Department states pt HGB of 7.8. Unable to reach pt by phone to encourage her to take her PNV and add iron per Rodena Piety, CNM. Pt postpartum appt is 11/04/2012. Will continue to try to reach pt.

## 2012-10-16 ENCOUNTER — Telehealth: Payer: Self-pay | Admitting: *Deleted

## 2012-10-16 NOTE — Telephone Encounter (Signed)
Pt informed to take PNV and OTC iron for a HGB of 7.8 per Rodena Piety, CNM. Pt verbalized understanding.

## 2012-11-04 ENCOUNTER — Ambulatory Visit (INDEPENDENT_AMBULATORY_CARE_PROVIDER_SITE_OTHER): Payer: Non-veteran care | Admitting: Obstetrics & Gynecology

## 2012-11-04 ENCOUNTER — Encounter: Payer: Self-pay | Admitting: Obstetrics & Gynecology

## 2012-11-04 NOTE — Progress Notes (Signed)
Patient ID: Marcia Hood, female   DOB: 1980-09-07, 32 y.o.   MRN: [id number] Pp/post op visit  No complaints Still bleeding Edinburgh score 11, discussed with pt Recommend follow up in 1 month to reassess  Exam Incision clean dry intact Normal bimanual exam normal involution  follw up 1 month S/p btl at time of section

## 2012-11-08 ENCOUNTER — Encounter: Payer: Self-pay | Admitting: *Deleted

## 2012-11-25 ENCOUNTER — Telehealth: Payer: Self-pay | Admitting: Obstetrics and Gynecology

## 2012-11-25 NOTE — Telephone Encounter (Signed)
Pt states still bleeding since delivery (c-section) 09/28/2012. States bleeding has lightened up today. Informed pt could be having a period. Pt informed to keep her appt with Dr. Despina Hidden 12/02/2012. Pt verbalized understanding.

## 2012-12-02 ENCOUNTER — Encounter: Payer: Self-pay | Admitting: Obstetrics & Gynecology

## 2012-12-02 ENCOUNTER — Ambulatory Visit (INDEPENDENT_AMBULATORY_CARE_PROVIDER_SITE_OTHER): Payer: Non-veteran care | Admitting: Obstetrics & Gynecology

## 2012-12-02 VITALS — BP 120/70 | Wt 138.0 lb

## 2012-12-02 DIAGNOSIS — O99345 Other mental disorders complicating the puerperium: Secondary | ICD-10-CM

## 2012-12-02 DIAGNOSIS — F53 Postpartum depression: Secondary | ICD-10-CM

## 2012-12-02 DIAGNOSIS — F329 Major depressive disorder, single episode, unspecified: Secondary | ICD-10-CM

## 2012-12-02 NOTE — Progress Notes (Signed)
Patient ID: Marcia Hood, female   DOB: 1980/08/01, 32 y.o.   MRN: [id number] Pt feels she is doing better, in a suboxone support group On 8 mg bid whoich is going well Taking concerta, restarted about 1 month ago  Pt does not feel as if anti depressant would be needed at this time

## 2012-12-05 ENCOUNTER — Other Ambulatory Visit: Payer: Self-pay

## 2013-03-02 ENCOUNTER — Emergency Department (HOSPITAL_COMMUNITY): Payer: Non-veteran care

## 2013-03-02 ENCOUNTER — Encounter (HOSPITAL_COMMUNITY): Payer: Self-pay | Admitting: Emergency Medicine

## 2013-03-02 ENCOUNTER — Emergency Department (HOSPITAL_COMMUNITY)
Admission: EM | Admit: 2013-03-02 | Discharge: 2013-03-02 | Disposition: A | Discharge status: Home / Self Care | Payer: Non-veteran care | Attending: Emergency Medicine | Admitting: Emergency Medicine

## 2013-03-02 DIAGNOSIS — Y939 Activity, unspecified: Secondary | ICD-10-CM | POA: Insufficient documentation

## 2013-03-02 DIAGNOSIS — F17200 Nicotine dependence, unspecified, uncomplicated: Secondary | ICD-10-CM | POA: Insufficient documentation

## 2013-03-02 DIAGNOSIS — S62309A Unspecified fracture of unspecified metacarpal bone, initial encounter for closed fracture: Secondary | ICD-10-CM

## 2013-03-02 DIAGNOSIS — F329 Major depressive disorder, single episode, unspecified: Secondary | ICD-10-CM | POA: Insufficient documentation

## 2013-03-02 DIAGNOSIS — F3289 Other specified depressive episodes: Secondary | ICD-10-CM | POA: Insufficient documentation

## 2013-03-02 DIAGNOSIS — F411 Generalized anxiety disorder: Secondary | ICD-10-CM | POA: Insufficient documentation

## 2013-03-02 DIAGNOSIS — Z880 Allergy status to penicillin: Secondary | ICD-10-CM | POA: Insufficient documentation

## 2013-03-02 DIAGNOSIS — I10 Essential (primary) hypertension: Secondary | ICD-10-CM | POA: Insufficient documentation

## 2013-03-02 DIAGNOSIS — S62329A Displaced fracture of shaft of unspecified metacarpal bone, initial encounter for closed fracture: Secondary | ICD-10-CM | POA: Insufficient documentation

## 2013-03-02 DIAGNOSIS — G8929 Other chronic pain: Secondary | ICD-10-CM | POA: Insufficient documentation

## 2013-03-02 DIAGNOSIS — R296 Repeated falls: Secondary | ICD-10-CM | POA: Insufficient documentation

## 2013-03-02 DIAGNOSIS — Z791 Long term (current) use of non-steroidal anti-inflammatories (NSAID): Secondary | ICD-10-CM | POA: Insufficient documentation

## 2013-03-02 DIAGNOSIS — Z79899 Other long term (current) drug therapy: Secondary | ICD-10-CM | POA: Insufficient documentation

## 2013-03-02 DIAGNOSIS — Y929 Unspecified place or not applicable: Secondary | ICD-10-CM | POA: Insufficient documentation

## 2013-03-02 DIAGNOSIS — G43909 Migraine, unspecified, not intractable, without status migrainosus: Secondary | ICD-10-CM | POA: Insufficient documentation

## 2013-03-02 DIAGNOSIS — S60229A Contusion of unspecified hand, initial encounter: Secondary | ICD-10-CM | POA: Insufficient documentation

## 2013-03-02 MED ORDER — HYDROCODONE-ACETAMINOPHEN 5-325 MG PO TABS: 1.0000 | ORAL_TABLET | ORAL | Status: DC | PRN

## 2013-03-02 MED ORDER — HYDROCODONE-ACETAMINOPHEN 5-325 MG PO TABS
1.0000 | ORAL_TABLET | Freq: Once | ORAL | Status: AC
  Administered 2013-03-02: 1 via ORAL
  Filled 2013-03-02: qty 1

## 2013-03-02 NOTE — ED Provider Notes (Signed)
CSN: [id number]     Arrival date & time 03/02/13  1125 History  This chart was scribed for non-physician practitioner, Burgess [id number] Azaria Stegman, PA-C,working with Gerhard Munchobert Lockwood, MD, by Karle PlumberJennifer Tensley, ED Scribe.  This patient was seen in room APFT23/APFT23 and the patient's care was started at 12:03 PM.  Chief Complaint  Patient presents with  . Hand Pain   The history is provided by the patient. No language interpreter was used.   HPI Comments:  Marcia ApplebaumCarolyn Townsend is a 33 y.o. female, with h/o bilateral carpal tunnel, who presents to the Emergency Department complaining of a right hand injury that occurred approximately 24 hours ago secondary to falling. She states she landed against cement steps with her right hand. She reports some mild tingling that is normal at baseline. Pt reports icing and buddy taping the third and fourth fingers. Pt states she has taken Ibuprofen and Aleve for pain with mild relief. Pt denies numbness of the fingers, shoulder or elbow pain. She denies head injury or any other pain.  She [facility] [place]. 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   Past Medical History  Diagnosis Date  . Hypertension   . Depression   . Anxiety   . Pregnant   . Migraine   . Chronic back pain    Past Surgical History  Procedure Laterality Date  . No past surgeries    . Cesarean section N/A 09/28/2012    Procedure: Primary cesarean section with delivery of baby girl at 1058. ;  Surgeon: Catalina AntiguaPeggy Constant, MD;  Location: [facility];  Service: Obstetrics;  Laterality: N/A;   Family History  Problem Relation Age of Onset  . Anesthesia problems Neg Hx   . Hypertension Mother   . Diabetes Father   . Heart attack Maternal Grandmother   . Cancer Maternal Grandmother     Bladder   History  Substance Use Topics  . Smoking status: Current Every Day Smoker -- 0.50 packs/day for 10 years    Types: Cigarettes  . Smokeless tobacco: Not on  file  . Alcohol Use: No   OB History   Grav Para Term Preterm Abortions TAB SAB Ect Mult Living   3 3 3  0 0 0 0 0 0 3     Review of Systems  Constitutional: Negative for fever.  Musculoskeletal: Positive for arthralgias (right hand) and joint swelling. Negative for myalgias.  Neurological: Negative for weakness and numbness.    Allergies  Penicillins  Home Medications   Current Outpatient Rx  Name  Route  Sig  Dispense  Refill  . buprenorphine-naloxone (SUBOXONE) 8-2 MG SUBL SL tablet   Sublingual   Place 1 tablet under the tongue 2 (two) times daily.          Marland Kitchen. gabapentin (NEURONTIN) 300 MG capsule   Oral   Take 600 mg by mouth 3 (three) times daily.         Marland Kitchen. ibuprofen (ADVIL,MOTRIN) 600 MG tablet   Oral   Take 1 tablet (600 mg total) by mouth every 6 (six) hours.   30 tablet   0   . methylphenidate (CONCERTA) 27 MG CR tablet   Oral   Take 27 mg by mouth 2 (two) times daily.          . naproxen sodium (ANAPROX) 220 MG tablet   Oral   Take 220 mg by mouth  2 (two) times daily with a meal.         . ROPINIROLE HCL PO   Oral   Take 1 tablet by mouth at bedtime.         Marland Kitchen zolpidem (AMBIEN) 10 MG tablet   Oral   Take 10 mg by mouth at bedtime as needed for sleep.         Marland Kitchen HYDROcodone-acetaminophen (NORCO/VICODIN) 5-325 MG per tablet   Oral   Take 1 tablet by mouth every 4 (four) hours as needed.   20 tablet   0    Triage Vitals: BP 122/67  Pulse 77  Temp(Src) 97.8 F (36.6 C) (Oral)  Resp 16  Ht 5\' 4"  (1.626 m)  Wt 138 lb (62.596 kg)  BMI 23.68 kg/m2  SpO2 100%  LMP 02/26/2013 Physical Exam  Constitutional: She appears well-developed and well-nourished.  HENT:  Head: Atraumatic.  Neck: Normal range of motion.  Cardiovascular:  Pulses equal bilaterally. Good cap refill.  Musculoskeletal: She exhibits edema and tenderness.       Right hand: She exhibits swelling. She exhibits normal capillary refill and no deformity. Normal sensation  noted.       Hands: Ecchymosis to the dorsal right hand. ttp right mcp, edema. Distal sensation intact.  Neurological: She is alert. She has normal strength. She displays normal reflexes. No sensory deficit.  Equal strength. Distal sensations intact.   Skin: Skin is warm and dry.  Psychiatric: She has a normal mood and affect.    ED Course  Procedures (including critical care time) DIAGNOSTIC STUDIES: Oxygen Saturation is 100% on RA, normal by my interpretation.   COORDINATION OF CARE: 12:07 PM- Will give orthopedic referral and prescribe pain medication. Pt verbalizes understanding and agrees to plan.  Medications  HYDROcodone-acetaminophen (NORCO/VICODIN) 5-325 MG per tablet 1 tablet (1 tablet Oral Given 03/02/13 1224)    Labs Review Labs Reviewed - No data to display Imaging Review Dg Hand Complete Right  03/02/2013   CLINICAL DATA:  Larey Seat down stairs yesterday, pain, swelling and bruising of the right hand.  EXAM: RIGHT HAND - COMPLETE 3+ VIEW  COMPARISON:  None.  FINDINGS: Comminuted mildly displaced fracture involving the distal shaft of the 5th metacarpal with volar angulation of the distal fragment. No other fractures. Well preserved joint spaces. Well preserved bone mineral density.  IMPRESSION: Comminuted mildly displaced fracture involving the distal shaft of the 5th metacarpal with volar angulation.   Electronically Signed   By: Hulan Saas M.D.   On: 03/02/2013 11:52    EKG Interpretation   None       MDM   1. Metacarpal bone fracture    Patients labs and/or radiological studies were viewed and considered during the medical decision making and disposition process. Pt placed in ulnar gutter splint, exxamined post application,  Pain improved, sensation and cap refill intact in fingertips.  [facility]  for further management.  Discussed use of suboxone.  Pt states she is given this for pain control of chronic back  pain, not for substance abuse.  Discussed with Dr. Jeraldine Loots.  Will give small quantity of hydrocodone, although advised this med may not be as effective if she takes with the suboxone. Pt understands plan.  Ice, elevation advised.  To call for ortho appt tomorrow.    I personally performed the services described in this documentation, which was scribed in my presence. The recorded information has been  reviewed and is accurate.    Burgess Amor, PA-C 03/03/13 [phone]

## 2013-03-02 NOTE — Discharge Instructions (Signed)
Boxer's Fracture °You have a break (fracture) of the fifth metacarpal bone. This is commonly called a boxer's fracture. This is the bone in the hand where the little finger attaches. The fracture is in the end of that bone, closest to the little finger. It is usually caused when you hit an object with a clenched fist. Often, the knuckle is pushed down by the impact. Sometimes, the fracture rotates out of position. A boxer's fracture will usually heal within 6 weeks, if it is treated properly and protected from re-injury. Surgery is sometimes needed. °A cast, splint, or bulky hand dressing may be used to protect and immobilize a boxer's fracture. Do not remove this device or dressing until your caregiver approves. Keep your hand elevated, and apply ice packs for 15-20 minutes every 2 hours, for the first 2 days. Elevation and ice help reduce swelling and relieve pain. See your caregiver, or an orthopedic specialist, for follow-up care within the next 10 days. This is to make sure your fracture is healing properly. °Document Released: 01/16/2005 Document Revised: 04/10/2011 Document Reviewed: 07/06/2006 °ExitCare® Patient Information ©2014 [employer]. ° °

## 2013-03-02 NOTE — ED Notes (Signed)
Pain to right hand after falling last night.

## 2013-03-03 ENCOUNTER — Telehealth: Payer: Self-pay | Admitting: Orthopedic Surgery

## 2013-03-03 NOTE — Telephone Encounter (Signed)
Patient called following Emergency room visit at [facility] for problem of fracture of "pinky finger."  She requested appointment, which I offered, and upon further discussion with patient and per chart notes, states she has only "[facility]" [facility].  I relayed to patient that this would be self-pay, unless she has either a voucher, or she may wish to [facility] to [facility], where it would be covered.  She will check further and will call back if needs to schedule.  Her ph# is [phone]

## 2013-03-05 NOTE — ED Provider Notes (Signed)
  Medical screening examination/treatment/procedure(s) were performed by non-physician practitioner and as supervising physician I was immediately available for consultation/collaboration.   Tedric Leeth, MD 03/05/13 1636 

## 2013-03-13 NOTE — Telephone Encounter (Signed)
As of 03/12/13, no further call or response from patient.

## 2013-12-01 ENCOUNTER — Encounter (HOSPITAL_COMMUNITY): Payer: Self-pay | Admitting: Emergency Medicine

## 2014-10-06 ENCOUNTER — Encounter (HOSPITAL_COMMUNITY): Payer: Self-pay

## 2014-10-06 ENCOUNTER — Emergency Department (HOSPITAL_COMMUNITY)
Admission: EM | Admit: 2014-10-06 | Discharge: 2014-10-06 | Disposition: A | Discharge status: Home / Self Care | Payer: Non-veteran care | Attending: Emergency Medicine | Admitting: Emergency Medicine

## 2014-10-06 DIAGNOSIS — Z720 Tobacco use: Secondary | ICD-10-CM | POA: Diagnosis not present

## 2014-10-06 DIAGNOSIS — F419 Anxiety disorder, unspecified: Secondary | ICD-10-CM | POA: Insufficient documentation

## 2014-10-06 DIAGNOSIS — Z79899 Other long term (current) drug therapy: Secondary | ICD-10-CM | POA: Insufficient documentation

## 2014-10-06 DIAGNOSIS — Z880 Allergy status to penicillin: Secondary | ICD-10-CM | POA: Insufficient documentation

## 2014-10-06 DIAGNOSIS — G8929 Other chronic pain: Secondary | ICD-10-CM | POA: Diagnosis not present

## 2014-10-06 DIAGNOSIS — R21 Rash and other nonspecific skin eruption: Secondary | ICD-10-CM | POA: Insufficient documentation

## 2014-10-06 DIAGNOSIS — I10 Essential (primary) hypertension: Secondary | ICD-10-CM | POA: Diagnosis not present

## 2014-10-06 DIAGNOSIS — G43909 Migraine, unspecified, not intractable, without status migrainosus: Secondary | ICD-10-CM | POA: Insufficient documentation

## 2014-10-06 MED ORDER — HYDROXYZINE HCL 25 MG PO TABS: 25.0000 mg | ORAL_TABLET | Freq: Four times a day (QID) | ORAL | Status: DC

## 2014-10-06 MED ORDER — ONDANSETRON HCL 4 MG PO TABS
4.0000 mg | ORAL_TABLET | Freq: Once | ORAL | Status: AC
  Administered 2014-10-06: 4 mg via ORAL
  Filled 2014-10-06: qty 1

## 2014-10-06 MED ORDER — PREDNISONE 10 MG PO TABS: ORAL_TABLET | ORAL | Status: DC

## 2014-10-06 NOTE — ED Provider Notes (Signed)
CSN: [id number]     Arrival date & time 10/06/14  4098 History   First MD Initiated Contact with Patient 10/06/14 0915     Chief Complaint  Patient presents with  . Rash     (Consider location/radiation/quality/duration/timing/severity/associated sxs/prior Treatment) HPI Comments: Patient is a 34 year old female who presents to the emergency department with complaint of a rash.  The patient states that one and half weeks ago she began noticed a rash under the breasts, that she thought may be related to heat. She later noticed the rash in her scalp and behind her ear, and then all over. She states that she noticed that sometimes there is a raised rash with a white center that is easily rubbed off. This has a red area around usually. She has some itching, and sometimes burning. She denies any changes in medications, foods, drinks, dryer sheets, detergent, soaps, or environment. She states that time to time she has problems "with her nerves". But she says that she infiltrate she has been any more anxious than usual, and usually her anxiety does not result in this type of rash. She has not had any problems with breathing. She's not had any facial swelling, no tongue swelling, no difficulty with swallowing.  Patient is a 35 y.o. female presenting with rash. The history is provided by the patient.  Rash Location:  Full body Associated symptoms: headaches     Past Medical History  Diagnosis Date  . Hypertension   . Depression   . Anxiety   . Pregnant   . Migraine   . Chronic back pain    Past Surgical History  Procedure Laterality Date  . No past surgeries    . Cesarean section N/A 09/28/2012    Procedure: Primary cesarean section with delivery of baby girl at 1058. ;  Surgeon: Catalina Antigua, MD;  Location: [facility];  Service: Obstetrics;  Laterality: N/A;  . Tubal ligation     Family History  Problem Relation Age of Onset  . Anesthesia problems Neg Hx   . Hypertension Mother   .  Diabetes Father   . Heart attack Maternal Grandmother   . Cancer Maternal Grandmother     Bladder   Social History  Substance Use Topics  . Smoking status: Current Every Day Smoker -- 0.50 packs/day for 10 years    Types: Cigarettes  . Smokeless tobacco: None  . Alcohol Use: No   OB History    Gravida Para Term Preterm AB TAB SAB Ectopic Multiple Living   3 3 3  0 0 0 0 0 0 3     Review of Systems  Skin: Positive for rash.  Neurological: Positive for headaches.  Psychiatric/Behavioral: The patient is nervous/anxious.   All other systems reviewed and are negative.     Allergies  Penicillins  Home Medications   Prior to Admission medications   Medication Sig Start Date End Date Taking? Authorizing Provider  buprenorphine-naloxone (SUBOXONE) 8-2 MG SUBL SL tablet Place 1 tablet under the tongue 2 (two) times daily.     Historical Provider, MD  gabapentin (NEURONTIN) 300 MG capsule Take 600 mg by mouth 3 (three) times daily.    Historical Provider, MD  HYDROcodone-acetaminophen (NORCO/VICODIN) 5-325 MG per tablet Take 1 tablet by mouth every 4 (four) hours as needed. 03/02/13   Burgess Amor, PA-C  hydrOXYzine (ATARAX/VISTARIL) 25 MG tablet Take 1 tablet (25 mg total) by mouth every 6 (six) hours. 10/06/14   Ivery Quale, PA-C  ibuprofen (ADVIL,MOTRIN)  600 MG tablet Take 1 tablet (600 mg total) by mouth every 6 (six) hours. 10/01/12   Vale Haven, MD  methylphenidate (CONCERTA) 27 MG CR tablet Take 27 mg by mouth 2 (two) times daily.     Historical Provider, MD  naproxen sodium (ANAPROX) 220 MG tablet Take 220 mg by mouth 2 (two) times daily with a meal.    Historical Provider, MD  predniSONE (DELTASONE) 10 MG tablet 5,4,3,2,1 - take with food 10/06/14   Ivery Quale, PA-C  ROPINIROLE HCL PO Take 1 tablet by mouth at bedtime.    Historical Provider, MD  zolpidem (AMBIEN) 10 MG tablet Take 10 mg by mouth at bedtime as needed for sleep.    Historical Provider, MD   BP 135/84 mmHg  Pulse  76  Temp(Src) 98.3 F (36.8 C) (Oral)  Resp 20  Ht 5\' 4"  (1.626 m)  Wt 150 lb (68.04 kg)  BMI 25.73 kg/m2  SpO2 100%  LMP 09/13/2014 Physical Exam  Constitutional: She is oriented to person, place, and time. She appears well-developed and well-nourished.  Non-toxic appearance.  HENT:  Head: Normocephalic.  Right Ear: Tympanic membrane and external ear normal.  Left Ear: Tympanic membrane and external ear normal.  Airway is patent. Speech is clear.  Eyes: EOM and lids are normal. Pupils are equal, round, and reactive to light.  Neck: Normal range of motion. Neck supple. Carotid bruit is not present.  Cardiovascular: Normal rate, regular rhythm, normal heart sounds, intact distal pulses and normal pulses.   Pulmonary/Chest: Breath sounds normal. No respiratory distress.  No rales, wheezes, or difficulty with breathing. Patient speaks in complete sentences.  Abdominal: Soft. Bowel sounds are normal. There is no tenderness. There is no guarding.  Musculoskeletal: Normal range of motion.  Lymphadenopathy:       Head (right side): No submandibular adenopathy present.       Head (left side): No submandibular adenopathy present.    She has no cervical adenopathy.  Neurological: She is alert and oriented to person, place, and time. She has normal strength. No cranial nerve deficit or sensory deficit.  Skin: Skin is warm and dry.  The patient has a papular slightly raised red rash behind her ears a few in her scalp, on the back, on the chest, abdomen, arms, and legs. There is a dry macular rash under the breasts consistent with fungal type unknown condition. There no red streaks. There is no sign of secondary infection.  Psychiatric: She has a normal mood and affect. Her speech is normal.  Nursing note and vitals reviewed.   ED Course  Procedures (including critical care time) Labs Review Labs Reviewed - No data to display  Imaging Review No results found. I have personally reviewed  and evaluated these images and lab results as part of my medical decision-making.   EKG Interpretation None      MDM  Vital signs well within normal limits. The patient has a rash that has been present for 1-1/2 weeks. The portion under the breasts looks more consistent with a fungal type issue. The patient will be treated with prednisone taper and Vistaril for the other portions of the rash. The patient is referred to Dr. Margo Aye for dermatology evaluation concerning this rash. I reassured the patient that there no airway changes, signs of acute problem at this time. The patient acknowledges these discharge instructions, and is in agreement.    Final diagnoses:  Rash    *I have reviewed nursing notes,  vital signs, and all appropriate lab and imaging results for this patient.[address], PA-C 10/06/14 1008  Bethann Berkshire, MD 10/06/14 1536

## 2014-10-06 NOTE — Discharge Instructions (Signed)
Please use the prednisone taper as prescribed. Please use warm Epsom salt tub soaks daily. Use Vistaril every 6 hours if needed for itching or burning sensation. This medication may cause drowsiness, please use with caution. Please see Dr. Margo Aye, or member of his team for additional evaluation and management of this rash.

## 2014-10-06 NOTE — ED Notes (Signed)
Pt reports broke out in rash approx 1 1/2 weeks ago under breasts.  Reports now rash has spread all over.  C/O itching.

## 2014-12-07 ENCOUNTER — Encounter (HOSPITAL_COMMUNITY): Payer: Self-pay | Admitting: Emergency Medicine

## 2014-12-07 ENCOUNTER — Emergency Department (HOSPITAL_COMMUNITY)
Admission: EM | Admit: 2014-12-07 | Discharge: 2014-12-07 | Disposition: A | Discharge status: Home / Self Care | Payer: BLUE CROSS/BLUE SHIELD | Attending: Emergency Medicine | Admitting: Emergency Medicine

## 2014-12-07 ENCOUNTER — Emergency Department (HOSPITAL_COMMUNITY): Payer: BLUE CROSS/BLUE SHIELD

## 2014-12-07 DIAGNOSIS — I10 Essential (primary) hypertension: Secondary | ICD-10-CM | POA: Insufficient documentation

## 2014-12-07 DIAGNOSIS — Y9389 Activity, other specified: Secondary | ICD-10-CM | POA: Insufficient documentation

## 2014-12-07 DIAGNOSIS — F329 Major depressive disorder, single episode, unspecified: Secondary | ICD-10-CM | POA: Insufficient documentation

## 2014-12-07 DIAGNOSIS — G43909 Migraine, unspecified, not intractable, without status migrainosus: Secondary | ICD-10-CM | POA: Insufficient documentation

## 2014-12-07 DIAGNOSIS — Z79899 Other long term (current) drug therapy: Secondary | ICD-10-CM | POA: Insufficient documentation

## 2014-12-07 DIAGNOSIS — Y998 Other external cause status: Secondary | ICD-10-CM | POA: Insufficient documentation

## 2014-12-07 DIAGNOSIS — F419 Anxiety disorder, unspecified: Secondary | ICD-10-CM | POA: Insufficient documentation

## 2014-12-07 DIAGNOSIS — S161XXA Strain of muscle, fascia and tendon at neck level, initial encounter: Secondary | ICD-10-CM | POA: Insufficient documentation

## 2014-12-07 DIAGNOSIS — S39012A Strain of muscle, fascia and tendon of lower back, initial encounter: Secondary | ICD-10-CM | POA: Insufficient documentation

## 2014-12-07 DIAGNOSIS — Y92410 Unspecified street and highway as the place of occurrence of the external cause: Secondary | ICD-10-CM | POA: Insufficient documentation

## 2014-12-07 DIAGNOSIS — G8929 Other chronic pain: Secondary | ICD-10-CM | POA: Insufficient documentation

## 2014-12-07 DIAGNOSIS — Z720 Tobacco use: Secondary | ICD-10-CM | POA: Insufficient documentation

## 2014-12-07 DIAGNOSIS — Z880 Allergy status to penicillin: Secondary | ICD-10-CM | POA: Insufficient documentation

## 2014-12-07 MED ORDER — CYCLOBENZAPRINE HCL 10 MG PO TABS: 10.0000 mg | ORAL_TABLET | Freq: Three times a day (TID) | ORAL | Status: DC | PRN

## 2014-12-07 NOTE — ED Notes (Signed)
Pt and family requesting an update from doctor. Family becoming agitated that MD has not returned. Reassured patient and family that the MD will return, but ED is extremely busy at the moment, but they have not been forgotten. Will notify MD Zammit. Consulting civil engineerCharge RN also notified.

## 2014-12-07 NOTE — ED Notes (Signed)
MD Zammit at bedside updating patient and family.  
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
## 2014-12-07 NOTE — ED Notes (Signed)
MD Zammit at bedside. 
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
## 2014-12-07 NOTE — ED Notes (Addendum)
MD Zammit notified of family's request. Will see patient shortly.

## 2014-12-07 NOTE — Discharge Instructions (Signed)
Take your motrin for pain and use the flexeril also.  Follow up with your md if not improving.

## 2015-03-20 ENCOUNTER — Emergency Department (HOSPITAL_COMMUNITY): Payer: Self-pay

## 2015-03-20 ENCOUNTER — Encounter (HOSPITAL_COMMUNITY): Payer: Self-pay | Admitting: *Deleted

## 2015-03-20 DIAGNOSIS — G8929 Other chronic pain: Secondary | ICD-10-CM | POA: Insufficient documentation

## 2015-03-20 DIAGNOSIS — R112 Nausea with vomiting, unspecified: Secondary | ICD-10-CM | POA: Insufficient documentation

## 2015-03-20 DIAGNOSIS — I10 Essential (primary) hypertension: Secondary | ICD-10-CM | POA: Insufficient documentation

## 2015-03-20 DIAGNOSIS — R51 Headache: Secondary | ICD-10-CM | POA: Insufficient documentation

## 2015-03-20 DIAGNOSIS — R079 Chest pain, unspecified: Secondary | ICD-10-CM | POA: Insufficient documentation

## 2015-03-20 DIAGNOSIS — F17210 Nicotine dependence, cigarettes, uncomplicated: Secondary | ICD-10-CM | POA: Insufficient documentation

## 2015-03-20 NOTE — ED Notes (Signed)
Pt c/o headache all day, n/v since 7pm, and chest pain.

## 2015-03-20 NOTE — ED Notes (Signed)
April from radiology called states that pt became upset when she went to get her for xray, requesting to speak to charge RN,

## 2015-03-20 NOTE — ED Notes (Signed)
RN went out to talk to pt, no answer in waiting room,

## 2015-03-21 ENCOUNTER — Emergency Department (HOSPITAL_COMMUNITY)
Admission: EM | Admit: 2015-03-21 | Discharge: 2015-03-21 | Disposition: A | Discharge status: Home / Self Care | Payer: Self-pay | Attending: Emergency Medicine | Admitting: Emergency Medicine

## 2015-03-21 ENCOUNTER — Emergency Department (HOSPITAL_COMMUNITY): Payer: Self-pay

## 2015-03-21 NOTE — ED Notes (Signed)
No answer in waiting room,  

## 2015-03-21 NOTE — ED Notes (Signed)
No answer when called to patient room 

## 2015-05-27 ENCOUNTER — Encounter (HOSPITAL_COMMUNITY): Payer: Self-pay | Admitting: Emergency Medicine

## 2015-05-27 ENCOUNTER — Emergency Department (HOSPITAL_COMMUNITY)
Admission: EM | Admit: 2015-05-27 | Discharge: 2015-05-27 | Disposition: A | Discharge status: Home / Self Care | Payer: Non-veteran care | Attending: Emergency Medicine | Admitting: Emergency Medicine

## 2015-05-27 DIAGNOSIS — F329 Major depressive disorder, single episode, unspecified: Secondary | ICD-10-CM | POA: Insufficient documentation

## 2015-05-27 DIAGNOSIS — Z5189 Encounter for other specified aftercare: Secondary | ICD-10-CM

## 2015-05-27 DIAGNOSIS — I10 Essential (primary) hypertension: Secondary | ICD-10-CM | POA: Diagnosis not present

## 2015-05-27 DIAGNOSIS — Z4801 Encounter for change or removal of surgical wound dressing: Secondary | ICD-10-CM | POA: Insufficient documentation

## 2015-05-27 DIAGNOSIS — F17210 Nicotine dependence, cigarettes, uncomplicated: Secondary | ICD-10-CM | POA: Insufficient documentation

## 2015-05-27 NOTE — ED Notes (Signed)
Pt states she had carpal tunnel sx on Wed and ripped open her sutures 2 days ago.  Now c/o excruciating pain in right hand.

## 2015-05-30 NOTE — ED Provider Notes (Signed)
CSN: [id number]     Arrival date & time 05/27/15  1747 History   First MD Initiated Contact with Patient 05/27/15 1958     Chief Complaint  Patient presents with  . Hand Problem     (Consider location/radiation/quality/duration/timing/severity/associated sxs/prior Treatment) HPI   Marcia Hood is a 35 y.o. female who presents to the Emergency Department requesting evaluation of her right hand.  She states that she had carpal tunnel surgery one week ago and she was concerned that she tore her sutures out 2 days ago.  She complains of pain to her hand with movement.  She has not contacted her surgeon.  She denies weakness of her fingers, fever, chills, swelling or drainage.    Past Medical History  Diagnosis Date  . Hypertension   . Depression   . Anxiety   . Pregnant   . Migraine   . Chronic back pain    Past Surgical History  Procedure Laterality Date  . No past surgeries    . Cesarean section N/A 09/28/2012    Procedure: Primary cesarean section with delivery of baby girl at 1058. ;  Surgeon: Catalina Antigua, MD;  Location: [facility];  Service: Obstetrics;  Laterality: N/A;  . Tubal ligation     Family History  Problem Relation Age of Onset  . Anesthesia problems Neg Hx   . Hypertension Mother   . Diabetes Father   . Heart attack Maternal Grandmother   . Cancer Maternal Grandmother     Bladder   Social History  Substance Use Topics  . Smoking status: Current Every Day Smoker -- 0.50 packs/day for 10 years    Types: Cigarettes  . Smokeless tobacco: None  . Alcohol Use: No   OB History    Gravida Para Term Preterm AB TAB SAB Ectopic Multiple Living   0 0 0 0 0 0 3     Review of Systems  Constitutional: Negative for fever and chills.  Musculoskeletal: Negative for back pain, joint swelling and arthralgias.  Skin: Positive for wound.       Incision of the right hand  Neurological: Negative for dizziness, weakness and numbness.  Hematological: Does not  bruise/bleed easily.  All other systems reviewed and are negative.     Allergies  Penicillins  Home Medications   Prior to Admission medications   Medication Sig Start Date End Date Taking? Authorizing Provider  amphetamine-dextroamphetamine (ADDERALL) 30 MG tablet Take 30 mg by mouth daily.    Historical Provider, MD  buprenorphine-naloxone (SUBOXONE) 8-2 MG SUBL SL tablet Place 1 tablet under the tongue 2 (two) times daily.     Historical Provider, MD  cyclobenzaprine (FLEXERIL) 10 MG tablet Take 1 tablet (10 mg total) by mouth 3 (three) times daily as needed for muscle spasms. 12/07/14   Bethann Berkshire, MD  gabapentin (NEURONTIN) 300 MG capsule Take 600 mg by mouth 3 (three) times daily.    Historical Provider, MD  hydrOXYzine (ATARAX/VISTARIL) 25 MG tablet Take 1 tablet (25 mg total) by mouth every 6 (six) hours. Patient not taking: Reported on 12/07/2014 10/06/14   Ivery Quale, PA-C  ibuprofen (ADVIL,MOTRIN) 600 MG tablet Take 1 tablet (600 mg total) by mouth every 6 (six) hours. 10/01/12   Vale Haven, MD  omeprazole (PRILOSEC) 20 MG capsule Take 20 mg by mouth daily.    Historical Provider, MD  predniSONE (DELTASONE) 10 MG tablet 5,4,3,2,1 - take with food Patient not taking: Reported on 12/07/2014 10/06/14  Ivery QualeHobson Bryant, PA-C  ROPINIROLE HCL PO Take 1 mg by mouth at bedtime.     Historical Provider, MD  zolpidem (AMBIEN) 10 MG tablet Take 10 mg by mouth at bedtime as needed for sleep.    Historical Provider, MD   BP 139/92 mmHg  Pulse 70  Temp(Src) 98.3 F (36.8 C) (Oral)  Resp 14  Ht 5\' 5"  (1.651 m)  Wt 58.968 kg  BMI 21.63 kg/m2  SpO2 100%  LMP 05/13/2015 Physical Exam  Constitutional: She is oriented to person, place, and time. She appears well-developed and well-nourished. No distress.  HENT:  Head: Normocephalic and atraumatic.  Cardiovascular: Normal rate, regular rhythm, normal heart sounds and intact distal pulses.   No murmur heard. Pulmonary/Chest: Effort normal  and breath sounds normal. No respiratory distress.  Musculoskeletal: She exhibits no edema or tenderness.  Neurological: She is alert and oriented to person, place, and time. She exhibits normal muscle tone. Coordination normal.  Skin: Skin is warm. Laceration noted.  Surgical incision to the proximal volar hand.  mattress sutures in place.  No drainage,edema, or surrounding erythema.  Radial pulse brisk.  No edema of the fingers.  Appears to be healing well  Nursing note and vitals reviewed.   ED Course  Procedures (including critical care time) Labs Review Labs Reviewed - No data to display  Imaging Review No results found. I have personally reviewed and evaluated these images and lab results as part of my medical decision-making.   EKG Interpretation None      MDM   Final diagnoses:  Visit for wound check   incision appears to be healing well.  no clinical signs of infection and sutures are intact.  Pt agrees to surgical f/u as previously scheduled.      Pauline Ausammy Tessa Seaberry, PA-C 05/30/15 1912  Rolland PorterMark James, MD 06/07/15 [phone]

## 2015-06-05 ENCOUNTER — Emergency Department (HOSPITAL_COMMUNITY)
Admission: EM | Admit: 2015-06-05 | Discharge: 2015-06-06 | Disposition: A | Discharge status: Other Acute Inpatient Hospital | Attending: Emergency Medicine | Admitting: Emergency Medicine

## 2015-06-05 ENCOUNTER — Encounter (HOSPITAL_COMMUNITY): Payer: Self-pay | Admitting: Emergency Medicine

## 2015-06-05 DIAGNOSIS — F17210 Nicotine dependence, cigarettes, uncomplicated: Secondary | ICD-10-CM | POA: Diagnosis not present

## 2015-06-05 DIAGNOSIS — Z79899 Other long term (current) drug therapy: Secondary | ICD-10-CM | POA: Insufficient documentation

## 2015-06-05 DIAGNOSIS — F329 Major depressive disorder, single episode, unspecified: Secondary | ICD-10-CM | POA: Diagnosis not present

## 2015-06-05 DIAGNOSIS — I10 Essential (primary) hypertension: Secondary | ICD-10-CM | POA: Diagnosis not present

## 2015-06-05 DIAGNOSIS — F1210 Cannabis abuse, uncomplicated: Secondary | ICD-10-CM

## 2015-06-05 DIAGNOSIS — F909 Attention-deficit hyperactivity disorder, unspecified type: Secondary | ICD-10-CM

## 2015-06-05 DIAGNOSIS — F1910 Other psychoactive substance abuse, uncomplicated: Secondary | ICD-10-CM | POA: Diagnosis present

## 2015-06-05 DIAGNOSIS — F4310 Post-traumatic stress disorder, unspecified: Secondary | ICD-10-CM

## 2015-06-05 LAB — COMPREHENSIVE METABOLIC PANEL
ALT: 33 U/L (ref 14–54)
AST: 40 U/L (ref 15–41)
Albumin: 4.5 g/dL (ref 3.5–5.0)
Alkaline Phosphatase: 68 U/L (ref 38–126)
Anion gap: 9 (ref 5–15)
BUN: 17 mg/dL (ref 6–20)
CO2: 25 mmol/L (ref 22–32)
Calcium: 10 mg/dL (ref 8.9–10.3)
Chloride: 104 mmol/L (ref 101–111)
Creatinine, Ser: 0.9 mg/dL (ref 0.44–1.00)
GFR calc Af Amer: >60 mL/min (ref >60)
GFR calc non Af Amer: >60 mL/min (ref >60)
Glucose, Bld: 103 mg/dL — ABNORMAL HIGH (ref 65–99)
Potassium: 3.7 mmol/L (ref 3.5–5.1)
Sodium: 138 mmol/L (ref 135–145)
Total Bilirubin: 0.6 mg/dL (ref 0.3–1.2)
Total Protein: 8.3 g/dL — ABNORMAL HIGH (ref 6.5–8.1)

## 2015-06-05 LAB — ETHANOL: Alcohol, Ethyl (B): <5 mg/dL (ref <5)

## 2015-06-05 LAB — RAPID URINE DRUG SCREEN, HOSP PERFORMED
Amphetamines: POSITIVE — AB
Barbiturates: NOT DETECTED
Benzodiazepines: POSITIVE — AB
Cocaine: POSITIVE — AB
Opiates: NOT DETECTED
Tetrahydrocannabinol: POSITIVE — AB

## 2015-06-05 LAB — CBC WITH DIFFERENTIAL/PLATELET
Basophils Absolute: 0 10*3/uL (ref 0.0–0.1)
Basophils Relative: 0 %
Eosinophils Absolute: 0.4 10*3/uL (ref 0.0–0.7)
Eosinophils Relative: 5 %
HCT: 39 % (ref 36.0–46.0)
Hemoglobin: 13.5 g/dL (ref 12.0–15.0)
Lymphocytes Relative: 30 %
Lymphs Abs: 2.9 10*3/uL (ref 0.7–4.0)
MCH: 32 pg (ref 26.0–34.0)
MCHC: 34.6 g/dL (ref 30.0–36.0)
MCV: 92.4 fL (ref 78.0–100.0)
Monocytes Absolute: 0.7 10*3/uL (ref 0.1–1.0)
Monocytes Relative: 7 %
Neutro Abs: 5.5 10*3/uL (ref 1.7–7.7)
Neutrophils Relative %: 58 %
Platelets: 295 10*3/uL (ref 150–400)
RBC: 4.22 MIL/uL (ref 3.87–5.11)
RDW: 13.2 % (ref 11.5–15.5)
WBC: 9.6 10*3/uL (ref 4.0–10.5)

## 2015-06-05 LAB — PREGNANCY, URINE: Preg Test, Ur: NEGATIVE

## 2015-06-05 LAB — ACETAMINOPHEN LEVEL: Acetaminophen (Tylenol), Serum: <10 ug/mL — ABNORMAL LOW (ref 10–30)

## 2015-06-05 MED ORDER — IBUPROFEN 400 MG PO TABS: 600.0000 mg | ORAL_TABLET | Freq: Four times a day (QID) | ORAL | Status: DC

## 2015-06-05 MED ORDER — ROPINIROLE HCL 1 MG PO TABS
1.0000 mg | ORAL_TABLET | Freq: Every day | ORAL | Status: DC
  Administered 2015-06-05 – 2015-06-06 (×2): 1 mg via ORAL
  Filled 2015-06-05 (×2): qty 1

## 2015-06-05 MED ORDER — BUPRENORPHINE HCL 2 MG SL SUBL
4.0000 mg | SUBLINGUAL_TABLET | Freq: Every day | SUBLINGUAL | Status: DC
  Administered 2015-06-05 – 2015-06-06 (×2): 4 mg via SUBLINGUAL
  Filled 2015-06-05 (×2): qty 2

## 2015-06-05 MED ORDER — AMPHETAMINE-DEXTROAMPHETAMINE 10 MG PO TABS
30.0000 mg | ORAL_TABLET | Freq: Every day | ORAL | Status: DC
  Administered 2015-06-06: 30 mg via ORAL
  Filled 2015-06-05: qty 3

## 2015-06-05 MED ORDER — PANTOPRAZOLE SODIUM 40 MG PO TBEC
40.0000 mg | DELAYED_RELEASE_TABLET | Freq: Every day | ORAL | Status: DC
  Administered 2015-06-06: 40 mg via ORAL
  Filled 2015-06-05: qty 1

## 2015-06-05 MED ORDER — ZOLPIDEM TARTRATE 5 MG PO TABS
10.0000 mg | ORAL_TABLET | Freq: Every evening | ORAL | Status: DC | PRN
  Administered 2015-06-05: 10 mg via ORAL
  Filled 2015-06-05 (×2): qty 2

## 2015-06-05 MED ORDER — ROPINIROLE HCL 1 MG PO TABS
ORAL_TABLET | ORAL | Status: AC
  Filled 2015-06-05: qty 1

## 2015-06-05 MED ORDER — GABAPENTIN 300 MG PO CAPS
600.0000 mg | ORAL_CAPSULE | Freq: Three times a day (TID) | ORAL | Status: DC
  Administered 2015-06-05 – 2015-06-06 (×4): 600 mg via ORAL
  Filled 2015-06-05 (×4): qty 2

## 2015-06-05 MED ORDER — SERTRALINE HCL 50 MG PO TABS
50.0000 mg | ORAL_TABLET | Freq: Every day | ORAL | Status: DC
  Administered 2015-06-05 – 2015-06-06 (×2): 50 mg via ORAL
  Filled 2015-06-05 (×2): qty 1

## 2015-06-05 MED ORDER — SUMATRIPTAN SUCCINATE 6 MG/0.5ML ~~LOC~~ SOLN: 6.0000 mg | Freq: Every day | SUBCUTANEOUS | Status: DC | PRN

## 2015-06-05 NOTE — ED Notes (Signed)
Pt states she is not sure why she is here other than under IVC.  Pt denies SI/HI.

## 2015-06-05 NOTE — ED Notes (Signed)
Patient's vitals updated at this time. Patient requesting dressing on hand, made aware that it has to air dry per order of Dr.
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
## 2015-06-05 NOTE — ED Notes (Signed)
Patient states hands hurts from where she had carpal tunnel surgery.
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
## 2015-06-05 NOTE — ED Notes (Signed)
MD at bedside. 

## 2015-06-06 DIAGNOSIS — F909 Attention-deficit hyperactivity disorder, unspecified type: Secondary | ICD-10-CM

## 2015-06-06 DIAGNOSIS — F1910 Other psychoactive substance abuse, uncomplicated: Secondary | ICD-10-CM | POA: Diagnosis not present

## 2015-06-06 DIAGNOSIS — F900 Attention-deficit hyperactivity disorder, predominantly inattentive type: Secondary | ICD-10-CM

## 2015-06-06 DIAGNOSIS — F4310 Post-traumatic stress disorder, unspecified: Secondary | ICD-10-CM

## 2015-06-06 DIAGNOSIS — F1210 Cannabis abuse, uncomplicated: Secondary | ICD-10-CM

## 2015-06-06 MED ORDER — ONDANSETRON HCL 4 MG PO TABS
4.0000 mg | ORAL_TABLET | Freq: Three times a day (TID) | ORAL | Status: DC | PRN
  Administered 2015-06-06: 4 mg via ORAL
  Filled 2015-06-06: qty 1

## 2015-06-06 MED ORDER — METRONIDAZOLE 500 MG PO TABS: 2000.0000 mg | ORAL_TABLET | Freq: Once | ORAL | Status: DC

## 2015-06-06 MED ORDER — CEFTRIAXONE SODIUM 250 MG IJ SOLR: 250.0000 mg | Freq: Once | INTRAMUSCULAR | Status: DC

## 2015-06-06 MED ORDER — AZITHROMYCIN 250 MG PO TABS: 1000.0000 mg | ORAL_TABLET | Freq: Once | ORAL | Status: DC

## 2015-06-06 NOTE — ED Notes (Signed)
Pt currently sleeping

## 2015-06-06 NOTE — Progress Notes (Signed)
Disposition CSW completed patient referrals to the following inpatient psych facilities:  [facility]  CSW will continue to follow patient for placement needs.  Quintus Premo LCSW,[facility] Behavior[facility] Disposition CSW [phone]   

## 2015-06-06 NOTE — ED Notes (Signed)
Pt up to shower.  Denies any additional needs at this time.

## 2015-06-06 NOTE — ED Notes (Signed)
Pt's husband arrived for visiting hours.  Advised pt he was here and pt requested to see him.
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
## 2015-06-06 NOTE — ED Notes (Signed)
Scanned and package open before being informed [facility]. Will waste, patient DID NOT take [place]

## 2015-06-06 NOTE — ED Notes (Signed)
Pt vomited x1.  Medicated as ordered.  States she is very hot and temperature in ED and her room is elevated.  Loel DubonnetFan provided and being monitored by sitter out of reach of patient.

## 2015-06-06 NOTE — ED Notes (Signed)
TSS to reassess at this time

## 2015-06-07 ENCOUNTER — Inpatient Hospital Stay (HOSPITAL_COMMUNITY)
Admission: AD | Admit: 2015-06-07 | Discharge: 2015-06-10 | DRG: 885 | Disposition: A | Discharge status: Home / Self Care | Payer: No Typology Code available for payment source | Attending: Psychiatry | Admitting: Psychiatry

## 2015-06-07 ENCOUNTER — Encounter (HOSPITAL_COMMUNITY): Payer: Self-pay | Admitting: *Deleted

## 2015-06-07 DIAGNOSIS — G43909 Migraine, unspecified, not intractable, without status migrainosus: Secondary | ICD-10-CM | POA: Diagnosis present

## 2015-06-07 DIAGNOSIS — Z880 Allergy status to penicillin: Secondary | ICD-10-CM | POA: Diagnosis not present

## 2015-06-07 DIAGNOSIS — F909 Attention-deficit hyperactivity disorder, unspecified type: Secondary | ICD-10-CM | POA: Diagnosis present

## 2015-06-07 DIAGNOSIS — Z8249 Family history of ischemic heart disease and other diseases of the circulatory system: Secondary | ICD-10-CM | POA: Diagnosis not present

## 2015-06-07 DIAGNOSIS — G4700 Insomnia, unspecified: Secondary | ICD-10-CM | POA: Diagnosis present

## 2015-06-07 DIAGNOSIS — K219 Gastro-esophageal reflux disease without esophagitis: Secondary | ICD-10-CM | POA: Diagnosis present

## 2015-06-07 DIAGNOSIS — F332 Major depressive disorder, recurrent severe without psychotic features: Secondary | ICD-10-CM | POA: Diagnosis present

## 2015-06-07 DIAGNOSIS — F1210 Cannabis abuse, uncomplicated: Secondary | ICD-10-CM | POA: Diagnosis present

## 2015-06-07 DIAGNOSIS — F4310 Post-traumatic stress disorder, unspecified: Secondary | ICD-10-CM | POA: Diagnosis present

## 2015-06-07 DIAGNOSIS — G2581 Restless legs syndrome: Secondary | ICD-10-CM | POA: Diagnosis present

## 2015-06-07 DIAGNOSIS — F17210 Nicotine dependence, cigarettes, uncomplicated: Secondary | ICD-10-CM | POA: Diagnosis present

## 2015-06-07 DIAGNOSIS — Z818 Family history of other mental and behavioral disorders: Secondary | ICD-10-CM | POA: Diagnosis not present

## 2015-06-07 DIAGNOSIS — Z833 Family history of diabetes mellitus: Secondary | ICD-10-CM

## 2015-06-07 DIAGNOSIS — I10 Essential (primary) hypertension: Secondary | ICD-10-CM | POA: Diagnosis present

## 2015-06-07 MED ORDER — ONDANSETRON HCL 4 MG PO TABS
4.0000 mg | ORAL_TABLET | Freq: Three times a day (TID) | ORAL | Status: DC | PRN
  Administered 2015-06-07: 4 mg via ORAL
  Filled 2015-06-07: qty 1

## 2015-06-07 MED ORDER — PANTOPRAZOLE SODIUM 40 MG PO TBEC
40.0000 mg | DELAYED_RELEASE_TABLET | Freq: Every day | ORAL | Status: DC
  Administered 2015-06-07 – 2015-06-10 (×4): 40 mg via ORAL
  Filled 2015-06-07 (×6): qty 1

## 2015-06-07 MED ORDER — BUPRENORPHINE HCL 2 MG SL SUBL
4.0000 mg | SUBLINGUAL_TABLET | Freq: Every day | SUBLINGUAL | Status: DC
  Administered 2015-06-07: 4 mg via SUBLINGUAL
  Filled 2015-06-07: qty 2

## 2015-06-07 MED ORDER — ALUM & MAG HYDROXIDE-SIMETH 200-200-20 MG/5ML PO SUSP: 30.0000 mL | ORAL | Status: DC | PRN

## 2015-06-07 MED ORDER — ROPINIROLE HCL 1 MG PO TABS
1.0000 mg | ORAL_TABLET | Freq: Every day | ORAL | Status: DC
  Administered 2015-06-07 – 2015-06-09 (×3): 1 mg via ORAL
  Filled 2015-06-07 (×5): qty 1

## 2015-06-07 MED ORDER — GABAPENTIN 300 MG PO CAPS
600.0000 mg | ORAL_CAPSULE | Freq: Three times a day (TID) | ORAL | Status: DC
  Administered 2015-06-07 – 2015-06-10 (×11): 600 mg via ORAL
  Filled 2015-06-07 (×16): qty 2

## 2015-06-07 MED ORDER — NICOTINE 21 MG/24HR TD PT24
21.0000 mg | MEDICATED_PATCH | Freq: Every day | TRANSDERMAL | Status: DC
  Administered 2015-06-07 – 2015-06-10 (×4): 21 mg via TRANSDERMAL
  Filled 2015-06-07 (×6): qty 1

## 2015-06-07 MED ORDER — AMPHETAMINE-DEXTROAMPHETAMINE 10 MG PO TABS
30.0000 mg | ORAL_TABLET | Freq: Every day | ORAL | Status: DC
  Administered 2015-06-07 – 2015-06-10 (×4): 30 mg via ORAL
  Filled 2015-06-07 (×4): qty 3

## 2015-06-07 MED ORDER — IBUPROFEN 600 MG PO TABS
600.0000 mg | ORAL_TABLET | Freq: Four times a day (QID) | ORAL | Status: DC
  Administered 2015-06-07 – 2015-06-10 (×15): 600 mg via ORAL
  Filled 2015-06-07 (×24): qty 1

## 2015-06-07 MED ORDER — PNEUMOCOCCAL VAC POLYVALENT 25 MCG/0.5ML IJ INJ
0.5000 mL | INJECTION | INTRAMUSCULAR | Status: AC
  Administered 2015-06-08: 0.5 mL via INTRAMUSCULAR

## 2015-06-07 MED ORDER — ACETAMINOPHEN 325 MG PO TABS: 650.0000 mg | ORAL_TABLET | Freq: Four times a day (QID) | ORAL | Status: DC | PRN

## 2015-06-07 MED ORDER — SERTRALINE HCL 50 MG PO TABS
50.0000 mg | ORAL_TABLET | Freq: Every day | ORAL | Status: DC
  Administered 2015-06-07: 50 mg via ORAL
  Filled 2015-06-07 (×2): qty 1

## 2015-06-07 MED ORDER — SUMATRIPTAN SUCCINATE 6 MG/0.5ML ~~LOC~~ SOLN: 6.0000 mg | Freq: Every day | SUBCUTANEOUS | Status: DC | PRN

## 2015-06-07 MED ORDER — MAGNESIUM HYDROXIDE 400 MG/5ML PO SUSP: 30.0000 mL | Freq: Every day | ORAL | Status: DC | PRN

## 2015-06-07 MED ORDER — ZOLPIDEM TARTRATE 5 MG PO TABS
5.0000 mg | ORAL_TABLET | Freq: Every evening | ORAL | Status: DC | PRN
  Administered 2015-06-07 – 2015-06-09 (×4): 5 mg via ORAL
  Filled 2015-06-07 (×4): qty 1

## 2015-06-07 MED ORDER — ENSURE ENLIVE PO LIQD
237.0000 mL | Freq: Two times a day (BID) | ORAL | Status: DC
  Administered 2015-06-07 (×2): 237 mL via ORAL
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
## 2015-06-07 NOTE — Progress Notes (Signed)
[facility], spoke with patient and she stated that she was feeling nauseous and that she was having she was having pain in her right hand (with had stitches in place). She also stated that she needed to something to rest. Administered Ibuprofen 800 mg, Ondansetron 4 mg PO, and Ambien 5 mg PO. Patient denied SI, HI, and AVH. Patient was pleasant and cooperative. Patient checked q 15 min, encouragement and support offered, and medications administered. Patient is compliant with care and was given opportunity to ask questions, will continue to monitor.
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
## 2015-06-07 NOTE — [facility] Group Notes (Signed)
   [facility] L[facility] Dis[facility] Planning Group Note  06/07/2015  8:45 AM   Participation Quality: Alert, Appropriate and Oriented  Mood/Affect: Appropriate  Depression Rating: 2  Anxiety Rating: 2  Thoughts of Suicide: Pt denies SI/HI  Will you contract for safety? Yes  Current AVH: Pt denies  Plan for Discharge/Comments: Pt attended discharge planning group and actively participated in group. CSW provided pt with today's workbook. Patient plans to return home to follow up with outpatient services.   Transportation Means: Pt reports access to transportation  Supports: No supports mentioned at this time  Samuella BruinKristin Kielee Care, MSW, Johnson & JohnsonLCSW Clinical Social Worker [facility] [phone]

## 2015-06-07 NOTE — Progress Notes (Signed)
35 year old female admitted on involuntary basis. Per report mother of pt initiated IVC due to concerns of substance abuse. On admission, pt feels she does not have an issue with substances and [place]. Pt adamantly denies any SI on admission and able to contract for safety in the hospital. Pt did endorse marijuana usage but reports she does not know how she was positive for cocaine as she does not use this substance. Pt was oriented to the unit and safety maintained.

## 2015-06-07 NOTE — Progress Notes (Signed)
NUTRITION ASSESSMENT  Pt identified as at risk on the Malnutrition Screen Tool  INTERVENTION: 1. Educated patient on the importance of nutrition and encouraged intake of food and beverages. 2. Discussed weight goals. 3. Supplements: continue Ensure Enlive po BID, each supplement provides 350 kcal and 20 grams of protein   NUTRITION DIAGNOSIS: Unintentional weight loss related to sub-optimal intake as evidenced by pt report.   Goal: Pt to meet >/= 90% of their estimated nutrition needs.  Monitor:  PO intake  Assessment:  Pt screened for MST. Pt involuntarily admitted for suspected drug abuse. Per review, pt has lost 16 lbs (11% body weight) in the past 6 months which is significant for time frame; continue Ensure Enlive BID order.  35 y.o. female  Height: Ht Readings from Last 1 Encounters:  06/07/15 5\' 4"  (1.626 m)    Weight: Wt Readings from Last 1 Encounters:  06/07/15 129 lb (58.514 kg)    Weight Hx: Wt Readings from Last 10 Encounters:  06/07/15 129 lb (58.514 kg)  06/05/15 130 lb (58.968 kg)  05/27/15 130 lb (58.968 kg)  12/07/14 145 lb (65.772 kg)  10/06/14 150 lb (68.04 kg)  03/02/13 138 lb (62.596 kg)  12/02/12 138 lb (62.596 kg)  11/04/12 142 lb (64.411 kg)  09/28/12 159 lb (72.122 kg)  09/27/12 159 lb (72.122 kg)    BMI:  Body mass index is 22.13 kg/(m^2). Pt meets criteria for normal weight based on current BMI.  Estimated Nutritional Needs: Kcal: 25-30 kcal/kg Protein: > 1 gram protein/kg Fluid: 1 ml/kcal  Diet Order: Diet regular Room service appropriate?: Yes; Fluid consistency:: Thin Pt is also offered choice of unit snacks mid-morning and mid-afternoon.  Pt is eating as desired.   Lab results and medications reviewed.      Trenton GammonJessica Myrtis Maille, RD, LDN Inpatient Clinical Dietitian Pager # [phone] After hours/weekend pager # [phone]

## 2015-06-07 NOTE — Tx Team (Signed)
Initial Interdisciplinary Treatment Plan   PATIENT STRESSORS: Marital or family conflict Substance abuse   PATIENT STRENGTHS: Ability for insight Active sense of humor Average or above average intelligence Capable of independent living General fund of knowledge Motivation for treatment/growth   PROBLEM LIST: Problem List/Patient Goals Date to be addressed Date deferred Reason deferred Estimated date of resolution  Substance Abuse 06/07/15     " I want to cope better about what's going on in my life" 06/07/15     Suicide Risk 06/07/15                                          DISCHARGE CRITERIA:  Ability to meet basic life and health needs Improved stabilization in mood, thinking, and/or behavior Verbal commitment to aftercare and medication compliance  PRELIMINARY DISCHARGE PLAN: Attend aftercare/continuing care group Return to previous living arrangement  PATIENT/FAMIILY INVOLVEMENT: This treatment plan has been presented to and reviewed with the patient, Marcia Hood, and/or family member, .  The patient and family have been given the opportunity to ask questions and make suggestions.  Vanita Cannell, [place] 06/07/2015, 1:16 AM

## 2015-06-07 NOTE — [facility] Group Notes (Signed)
Adult Psychoeducational Group Note  Date:  06/07/2015 Time:  9:30 PM  Group Topic/Focus:  Wrap-Up Group:   The focus of this group is to help patients review their daily goal of treatment and discuss progress on daily workbooks.  Participation Level:  Active  Participation Quality:  Appropriate  Affect:  Appropriate  Cognitive:  Appropriate  Insight: Good  Engagement in Group:  Engaged  Modes of Intervention:  Discussion  Additional Comments:  Pt rated her day an 8.  Pt stated this was her first full day and it was good because she stayed busy.  Her goal was to focus on getting out of here.  Caroll RancherLindsay, Inocencio Roy A 06/07/2015, 9:30 PM

## 2015-06-07 NOTE — Progress Notes (Signed)
Data Reports need for PRN sleep medicine.  Affect appropriate, mood "anxious."  Denies HI & SI, no complaints of AVH.  No redness/swelling/drainage from incision on right wrist (surgical), edges well approximated, stitches intact.  C/O pain (see flowsheet/MAR), states scheduled ibuprofen helpful.  Action PRN ambien given, offered time to process with nurse.  Continued on 15 minute checks.  Response Asleep on 2315 check, remains safe on the unit.

## 2015-06-07 NOTE — Progress Notes (Signed)
Recreation Therapy Notes  Date: 05.08.2017 Time: 9:30am Location: [address] Group Room   Group Topic: Stress Management  Goal Area(s) Addresses:  Patient will actively participate in stress management techniques presented during session.   Behavioral Response: Did not attend.   Raelan Burgoon L Ashonte Angelucci, LRT/CTRS        Hershel Corkery L 06/07/2015 11:51 AM 

## 2015-06-07 NOTE — Plan of Care (Signed)
Problem: Alteration in mood & ability to function due to Goal: LTG-Pt verbalizes understanding of importance of med regimen (Patient verbalizes understanding of importance of medication regimen and need to continue outpatient care and support groups)  Outcome: Progressing Pt verbalized understanding of med regimen. Pt compliant with taking meds.
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
## 2015-06-07 NOTE — Tx Team (Signed)
Interdisciplinary Treatment Plan Update (Adult) Date: 06/07/2015    Time Reviewed: 9:30 AM  Progress in Treatment: Attending groups: Continuing to assess, patient new to milieu Participating in groups: Continuing to assess, patient new to milieu Taking medication as prescribed: Yes Tolerating medication: Yes Family/Significant other contact made: No, CSW assessing for appropriate contacts Patient understands diagnosis: Yes Discussing patient identified problems/goals with staff: Yes Medical problems stabilized or resolved: Yes Denies suicidal/homicidal ideation: Yes Issues/concerns per patient self-inventory: Yes Other:  New problem(s) identified: N/A  Discharge Plan or Barriers: Home with outpatient services.   Reason for Continuation of Hospitalization:  Depression Anxiety Medication Stabilization   Comments: N/A  Estimated length of stay: 3-5 days    Patient is a 35 year old female who presented IVC'd by mother for suspected substance abuse and aggressive behaviors. Patient denies allegations. Patient will benefit from crisis stabilization, medication evaluation, group therapy and psycho education in addition to case management for discharge planning. At discharge, it is recommended that Pt remain compliant with established discharge plan and continued treatment.   Review of initial/current patient goals per problem list:  1. Goal(s): Patient will participate in aftercare plan   Met: Yes   Target date: 3-5 days post admission date   As evidenced by: Patient will participate within aftercare plan AEB aftercare provider and housing plan at discharge being identified.  5/8: Goal met. Patient plans to return home to follow up with outpatient providers.    2. Goal (s): Patient will exhibit decreased depressive symptoms and suicidal ideations.   Met: Yes   Target date: 3-5 days post admission date   As evidenced by: Patient will utilize self rating of  depression at 3 or below and demonstrate decreased signs of depression or be deemed stable for discharge by MD.  5/8: Goal met. Patient rates depression at 2, denies SI.    3. Goal(s): Patient will demonstrate decreased signs and symptoms of anxiety.   Met: Yes   Target date: 3-5 days post admission date   As evidenced by: Patient will utilize self rating of anxiety at 3 or below and demonstrated decreased signs of anxiety, or be deemed stable for discharge by MD  5/8: Goal met. Patient rates anxiety at 2.    4. Goal(s): Patient will demonstrate decreased signs of withdrawal due to substance abuse   Met: No   Target date: 3-5 days post admission date   As evidenced by: Patient will produce a CIWA/COWS score of 0, have stable vitals signs, and no symptoms of withdrawal  5/8: COWS score of 2, experiencing resting pulse rate and anxiety.   Attendees: Patient:    Family:    Physician: Dr. Parke Poisson 06/07/2015 9:30 AM  Nursing: Darrol Angel, RN 06/07/2015 9:30 AM  Clinical Social Worker: Tilden Fossa, LCSW 06/07/2015 9:30 AM  Other:  06/07/2015 9:30 AM  Other: Norberto Sorenson, [place] 06/07/2015 9:30 AM  Other:  06/07/2015 9:30 AM  Other: Andria Rhein, NP 06/07/2015 9:30 AM  Other:    Other:      Scribe for Treatment Team:  Tilden Fossa, [place]

## 2015-06-08 LAB — TSH: TSH: 2.316 u[IU]/mL (ref 0.350–4.500)

## 2015-06-08 MED ORDER — SERTRALINE HCL 100 MG PO TABS
100.0000 mg | ORAL_TABLET | Freq: Every day | ORAL | Status: DC
  Administered 2015-06-08 – 2015-06-09 (×2): 100 mg via ORAL
  Filled 2015-06-08 (×4): qty 1

## 2015-06-08 NOTE — [facility] Suicide Risk Assessment (Signed)
[facility] INPATIENT:  Family/Significant Other Suicide Prevention Education  Suicide Prevention Education:  Education Completed; Wardell Heathodd Lindamood, Pt's father [phone],  has been identified by the patient as the family member/significant other with whom the patient will be residing, and identified as the person(s) who will aid the patient in the event of a mental health crisis (suicidal ideations/suicide attempt).  With written consent from the patient, the family member/significant other has been provided the following suicide prevention education, prior to the and/or following the discharge of the patient.  The suicide prevention education provided includes the following:  Suicide risk factors  Suicide prevention and interventions  National Suicide Hotline telephone number  [facility] assessment telephone number  [facility] Assistance 911  [facility] and/or Residential Mobile Crisis Unit telephone number  Request made of family/significant other to:  Remove weapons (e.g., guns, rifles, knives), all items previously/currently identified as safety concern.    Remove drugs/medications (over-the-counter, prescriptions, illicit drugs), all items previously/currently identified as a safety concern.  The family member/significant other verbalizes understanding of the suicide prevention education information provided.  The family member/significant other agrees to remove the items of safety concern listed above.  Elaina Hoopsarter, Reese Senk M 06/08/2015, 4:47 PM

## 2015-06-08 NOTE — Progress Notes (Signed)
Adult Psychoeducational Group Note  Date:  06/08/2015 Time:  10:23 PM  Group Topic/Focus:  Wrap-Up Group:   The focus of this group is to help patients review their daily goal of treatment and discuss progress on daily workbooks.  Participation Level:  Active  Participation Quality:  Appropriate  Affect:  Appropriate  Cognitive:  Appropriate  Insight: Good  Engagement in Group:  Engaged  Modes of Intervention:  Activity  Additional Comments:  Patient rated her day an 8. Goal is to live healthier and happier lifestyle when leaving here so she can spend time with family and her children.  Marcia Hood 06/08/2015, 10:23 PM

## 2015-06-08 NOTE — Progress Notes (Signed)
Patient up and visible in milieu. Interacting with peers and staff appropriately. Rates her depression, hopelessness and anxiety all at a 0/10. States her sleep, appetite, concentration and energy are good today. Affect animated, mood slightly anxious. States her goal is to work on her discharge plan as well as "other life changing plans." States her R hand incisional pain is at a 5/10. Wound inspected and is free of signs, symptoms of infection. Sutures intact. Asked that wound be covered and bandaids applied. Medicated per orders. Emotional support provided. Self inventory reviewed. She remains safe on level III obs.

## 2015-06-08 NOTE — Progress Notes (Addendum)
[facility] MD Progress Note  06/08/2015 1:12 PM Wm Fruchter  MRN:  [id number] Subjective:   Patient reports some improvement, but remains vaguely anxious and continues to ruminate about stressors . Denies medication side effects. Objective : I have reviewed case with treatment team and have met with patient . Patient presents partially improved compared to admission, with improving range of affect. At this time acknowledges improvement , but continues to ruminate about her stressors, to include recent separation from husband, CPS involvement, and lack of job . States she is strongly motivated in sobriety, and states " I know I that when I am taking my medications as prescribed and do not use any drugs I do really well ". Currently off Suboxone, which she had restarted a few days prior to admission- states she feels " the same " and does not endorse worsening pain or any symptoms of opiate WDL - appears calm and in no acute distress or discomfort . We discussed current medication regimen- states she has been on this regimen for a period of time, as managed by [facility] and does not want to change it at this time. She is aware of abuse potential associated with amphetamine ( on Adderall for ADHD history)  No disruptive or agitated behaviors on unit  Labs - TSH WNL Principal Problem: Severe episode of recurrent major depressive disorder, without psychotic features ([place]) Diagnosis:   Patient Active Problem List   Diagnosis Date Noted  . Severe episode of recurrent major depressive disorder, without psychotic features ([place]) [F33.2]   . ADHD (attention deficit hyperactivity disorder) [F90.9] 06/06/2015  . PTSD (post-traumatic stress disorder) [F43.10] 06/06/2015  . Marijuana abuse [F12.10] 06/06/2015  . Supervision of other normal pregnancy [Z34.80] 09/06/2012  . Maternal chronic hypertension [O10.919, I10] 08/12/2012  . High-risk pregnancy [O09.90] 05/16/2012  . 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 Hypertension   . Depression   . Anxiety   . Pregnant   . Migraine   . Chronic back pain     Past Surgical History  Procedure Laterality Date  . No past surgeries    . Cesarean section N/A 09/28/2012    Procedure: Primary cesarean section with delivery of baby girl at 66. ;  Surgeon: Mora Bellman, MD;  Location: [facility];  Service: Obstetrics;  Laterality: N/A;  . Tubal ligation    . Carpal tunnel release     Family History:  Family History  Problem Relation Age of Onset  . Anesthesia problems Neg Hx   . Hypertension Mother   . Diabetes Father   . Heart attack Maternal Grandmother   . Cancer Maternal Grandmother     Bladder    Social History:  History  Alcohol Use No     History  Drug Use  . Yes  . Special: Benzodiazepines, Cocaine, Amphetamines, Marijuana    Comment: on Suboxone 8 mg; no other drugs.    Social History   Social History  . Marital Status: Married    Spouse Name: N/A  . Number of Children: N/A  . Years of Education: N/A   Social History Main Topics  . Smoking status: Current Every Day Smoker -- 0.50 packs/day for 10 years    Types: Cigarettes  . Smokeless tobacco: None  . Alcohol Use: No  . Drug Use: Yes    Special: Benzodiazepines, Cocaine,  Amphetamines, Marijuana     Comment: on Suboxone 8 mg; no other drugs.  . Sexual Activity: Yes    Birth Control/ Protection: Surgical   Other Topics Concern  . None   Social History Narrative   Additional Social History:   Sleep: Good  Appetite:  Good  Current Medications: Current Facility-Administered Medications  Medication Dose Route Frequency Provider Last Rate Last Dose  . acetaminophen (TYLENOL) tablet 650 mg  650 mg Oral Q6H PRN Thermon Leyland, NP      . alum & mag hydroxide-simeth (MAALOX/MYLANTA) 200-200-20 MG/5ML suspension 30 mL  30 mL Oral  Q4H PRN Thermon Leyland, NP      . amphetamine-dextroamphetamine (ADDERALL) tablet 30 mg  30 mg Oral Daily Thermon Leyland, NP   30 mg at 06/08/15 0757  . feeding supplement (ENSURE ENLIVE) (ENSURE ENLIVE) liquid 237 mL  237 mL Oral BID BM Rockey Situ Falyn Rubel, MD   237 mL at 06/07/15 1437  . gabapentin (NEURONTIN) capsule 600 mg  600 mg Oral TID Thermon Leyland, NP   600 mg at 06/08/15 1207  . ibuprofen (ADVIL,MOTRIN) tablet 600 mg  600 mg Oral Q6H Thermon Leyland, NP   600 mg at 06/08/15 1207  . magnesium hydroxide (MILK OF MAGNESIA) suspension 30 mL  30 mL Oral Daily PRN Thermon Leyland, NP      . nicotine (NICODERM CQ - dosed in mg/24 hours) patch 21 mg  21 mg Transdermal Daily Craige Cotta, MD   21 mg at 06/08/15 0757  . ondansetron (ZOFRAN) tablet 4 mg  4 mg Oral Q8H PRN Thermon Leyland, NP   4 mg at 06/07/15 0127  . pantoprazole (PROTONIX) EC tablet 40 mg  40 mg Oral Daily Thermon Leyland, NP   40 mg at 06/08/15 0757  . rOPINIRole (REQUIP) tablet 1 mg  1 mg Oral QHS Thermon Leyland, NP   1 mg at 06/07/15 2146  . sertraline (ZOLOFT) tablet 100 mg  100 mg Oral QHS Craige Cotta, MD      . SUMAtriptan (IMITREX) injection 6 mg  6 mg Subcutaneous Daily PRN Thermon Leyland, NP      . zolpidem (AMBIEN) tablet 5 mg  5 mg Oral QHS PRN Thermon Leyland, NP   5 mg at 06/07/15 2146    Lab Results:  Results for orders placed or performed during the hospital encounter of 06/07/15 (from the past 48 hour(s))  TSH     Status: None   Collection Time: 06/08/15  6:13 AM  Result Value Ref Range   TSH 2.316 0.350 - 4.500 uIU/mL    Comment: Performed at [facility]    Blood Alcohol level:  Lab Results  Component Value Date   ETH <5 06/05/2015   ETH * 05/29/2010    175        LOWEST DETECTABLE LIMIT FOR SERUM ALCOHOL IS 5 mg/dL FOR MEDICAL PURPOSES ONLY    Physical Findings: AIMS: Facial and Oral Movements Muscles of Facial Expression: None, normal Lips and Perioral Area: None, normal Jaw:  None, normal Tongue: None, normal,Extremity Movements Upper (arms, wrists, hands, fingers): None, normal Lower (legs, knees, ankles, toes): None, normal, Trunk Movements Neck, shoulders, hips: None, normal, Overall Severity Severity of abnormal movements (highest score from questions above): None, normal Incapacitation due to abnormal movements: None, normal Patient's awareness of abnormal movements (rate only patient's report): No Awareness, Dental Status Current problems with teeth and/or  dentures?: No Does patient usually wear dentures?: No  CIWA:    COWS:  COWS Total Score: 2  Musculoskeletal: Strength & Muscle Tone: within normal limits Gait & Station: normal Patient leans: N/A  Psychiatric Specialty Exam: ROS no headache, no chest pain, no shortness of breath, no rash   Blood pressure 117/82, pulse 87, temperature 98.4 F (36.9 C), temperature source Oral, resp. rate 16, height '5\' 4"'$  (1.626 m), weight 129 lb (58.514 kg), last menstrual period 05/13/2015.Body mass index is 22.13 kg/(m^2).  General Appearance: Well Groomed  Engineer, water::  Good  Speech:  Normal Rate  Volume:  Normal  Mood:  improved, but still depressed, anxious   Affect:  anxious, but more reactive  Thought Process:  Linear  Orientation:  Full (Time, Place, and Person)  Thought Content:  continues to ruminate about stressors, no hallucinations, no delusions   Suicidal Thoughts:  No at this time denies any suicidal ideations, also denies any self injurious ideations, no homicidal ideations   Homicidal Thoughts:  No  Memory:  recent and remote grossly intact   Judgement:  Improving   Insight:   Improving   Psychomotor Activity:  Normal  Concentration:  Good  Recall:  Good  Fund of Knowledge:Good  Language: Good  Akathisia:  Negative  Handed:  Right  AIMS (if indicated):     Assets:  Communication Skills Desire for Improvement Resilience  ADL's:  Intact  Cognition: WNL  Sleep:  Number of Hours: 6.5   Assessment - Patient reports partial improvement and does present less depressed, with a more reactive affect. Denies any SI. She does continue to ruminate about stressors, and presents with some anxiety. Thus far tolerating medications well.  Treatment Plan Summary: Daily contact with patient to assess and evaluate symptoms and progress in treatment, Medication management, Plan inpatient treatment and medications as below  Encourage ongoing group participation and milieu Continue Neurontin 600 mgrs TID for management of pain and anxiety  Continue Adderall 30 mgrs QDAY for management of ADHD  Continue Ambien 5 mgrs QHS PRN for insomnia, as needed  Increase Zoloft to 100 mgrs QDAY for management of depression, anxiety, PTSD  Continue Requip 1 mgr QHS for management of Restless Leg Syndrome  Treatment team working on disposition planning options  Continue to encourage maintaining focus on recovery, sobriety .   Neita Garnet, MD 06/08/2015, 1:12 PM

## 2015-06-08 NOTE — [facility] Group Notes (Signed)
[facility] Group Notes:  (Nursing/MHT/Case Management/Adjunct)  Date:  06/08/2015  Time:  0840  Type of Therapy:  Nurse Education - Recovery  Participation Level:  Active  Participation Quality:  Attentive  Affect:  Anxious  Cognitive:  Appropriate  Insight:  Improving  Engagement in Group:  Engaged  Modes of Intervention:  Discussion, Education and Support  Summary of Progress/Problems: Patient attended and participated in group.  Merian CapronFriedman, Kaydi Kley [facility] 06/08/2015, [phone]

## 2015-06-08 NOTE — Plan of Care (Signed)
Problem: Ineffective individual coping Goal: STG: Patient will remain free from self harm Outcome: Progressing Patient has not engaged in self harm  Problem: Alteration in mood & ability to function due to Goal: STG-Patient will comply with prescribed medication regimen (Patient will comply with prescribed medication regimen)  Outcome: Progressing Patient is med compliant.     

## 2015-06-08 NOTE — Progress Notes (Signed)
Pt attended spiritual care group on grief and loss facilitated by chaplain Taijon Vink   Group opened with brief discussion and psycho-social ed around grief and loss in relationships and in relation to self - identifying life patterns, circumstances, changes that cause losses. Established group norm of speaking from own life experience. Group goal of establishing open and affirming space for members to share loss and experience with grief, normalize grief experience and provide psycho social education and grief support.     

## 2015-06-08 NOTE — [facility] Group Notes (Signed)
[facility] LCSW Group Therapy 06/08/2015 1:15 PM  Type of Therapy: Group Therapy- Feelings about Diagnosis  Participation Level: Active   Participation Quality:  Appropriate  Affect:  Appropriate  Cognitive: Alert and Oriented   Insight:  Developing   Engagement in Therapy: Developing/Improving and Engaged   Modes of Intervention: Clarification, Confrontation, Discussion, Education, Exploration, Limit-setting, Orientation, Problem-solving, Rapport Building, Dance movement psychotherapisteality Testing, Socialization and Support  Description of Group:   This group will allow patients to explore their thoughts and feelings about diagnoses they have received. Patients will be guided to explore their level of understanding and acceptance of these diagnoses. Facilitator will encourage patients to process their thoughts and feelings about the reactions of others to their diagnosis, and will guide patients in identifying ways to discuss their diagnosis with significant others in their lives. This group will be process-oriented, with patients participating in exploration of their own experiences as well as giving and receiving support and challenge from other group members.  Summary of Progress/Problems:  Pt participated appropriately in group discussion. She reports that receiving a diagnosis from the [employer] made her symptoms more "real" because a "doctor had noticed them." Pt discussed how stigma impacts her life in a negative way but reports that she does have a support network who is understanding of her struggle with mental illness.   Therapeutic Modalities:   Cognitive Behavioral Therapy Solution Focused Therapy Motivational Interviewing Relapse Prevention Therapy  Chad CordialLauren Carter, LCSWA 06/08/2015 2:51 PM

## 2015-06-09 NOTE — Progress Notes (Signed)
[facility] Group Notes:  (Nursing/MHT/Case Management/Adjunct)  Date:  06/09/2015  Time:  10:36 AM  Type of Therapy:  Psychoeducational Skills  Participation Level:  Active  Participation Quality:  Appropriate, Sharing and Supportive  Affect:  Appropriate  Cognitive:  Alert and Appropriate  Insight:  Good  Engagement in Group:  Developing/Improving  Modes of Intervention:  Discussion and Socialization  Summary of Progress/Problems: Pt discussed her ability to adapt change  Donell BeersRodney S Nike Southers 06/09/2015, 10:36 AM

## 2015-06-09 NOTE — Progress Notes (Signed)
Recreation Therapy Notes  Date: 05.10.2017 Time: 9:30am Location: [address] Dayroom  Group Topic: Stress Management  Goal Area(s) Addresses:  Patient will actively participate in stress management techniques presented during session.   Behavioral Response: Did not attend.   Torryn Hudspeth L Keawe Marcello, LRT/CTRS         Karsynn Deweese L 06/09/2015 12:10 PM 

## 2015-06-09 NOTE — Plan of Care (Signed)
Problem: Ineffective individual coping Goal: STG-Increase in ability to manage activities of daily living Outcome: Progressing Patient up and dressed, completing hygiene.  Problem: Alteration in mood & ability to function due to Goal: STG: Patient verbalizes decreases in signs of withdrawal Outcome: Completed/Met Date Met:  06/09/15 Patient denies w/d symptoms.     

## 2015-06-09 NOTE — Progress Notes (Signed)
  [facility] :  Will you be returning to the same living situation after discharge:  No, patient plans to stay with family At discharge, do you have transportation home?: Yes,  family Do you have the ability to pay for your medications: Yes,  patient will be provided with prescriptions at discharge  Release of information consent forms completed and in the chart;  Patient's signature needed at discharge.  Patient to Follow up at: Follow-up Information    [facility].   Why:  Outpatient Mental Health appt on Thursday May 11th at 2pm. Call office if you need to reschedule.    Contact information:   [address]- 1st floor [place], [place] [address] [phone]       [facility].   Why:  Orthopedic appt on May 15th at 2:30pm. Substance Abuse appt on May 19th at 8:30am. Call office if you need to reschedule.    Contact information:   [address] [place], [place] [address] Phone: [phone]      Next level of care provider [facility] Link:no  Safety Planning and Suicide Prevention discussed: Yes,  with patient and father  Have you used any form of tobacco in the last 30 days? (Cigarettes, Smokeless Tobacco, Cigars, and/or Pipes): Yes  Has patient been referred to the Quitline?: Patient refused referral  Patient has been referred for addiction treatment: Yes  Nakeda Lebron, West CarboKristin L 06/09/2015, 5:17 PM

## 2015-06-09 NOTE — [facility] Group Notes (Signed)
[facility] LCSW Group Therapy 06/09/2015  1:15 PM Type of Therapy: Group Therapy Participation Level: Active  Participation Quality: Attentive  Affect: Appropriate  Cognitive: Alert and Oriented  Insight: Developing/Improving and Engaged  Engagement in Therapy: Developing/Improving and Engaged  Modes of Intervention: Clarification, Confrontation, Discussion, Education, Exploration, Limit-setting, Orientation, Problem-solving, Rapport Building, Dance movement psychotherapisteality Testing, Socialization and Support  Summary of Progress/Problems: The topic for group today was emotional regulation. This group focused on both positive and negative emotion identification and allowed group members to process ways to identify feelings, regulate negative emotions, and find healthy ways to manage internal/external emotions. Group members were asked to reflect on a time when their reaction to an emotion led to a negative outcome and explored how alternative responses using emotion regulation would have benefited them. Group members were also asked to discuss a time when emotion regulation was utilized when a negative emotion was experienced. Patient discussed difficulty managing her anger related to family accusing her of drug use instead of understanding her mental illness. CSW and other group members provided patient with emotional support and encouragement.    Samuella BruinKristin Jasai Sorg, MSW, LCSW Clinical Social Worker [facility] [phone]

## 2015-06-09 NOTE — Progress Notes (Signed)
Patient up and visible on unit. Smiling on approach but otherwise anxious in affect with congruent mood. Patient reports missing her children, husband. Hopeful for discharge today or tomorrow. Rates her depression, hopelessness and anxiety all at a 0/10. States her goal is to "finish developing a discharge plan and hopefully leave by tomorrow." Plans to attend groups and speak to SW. R hand pain is at an 8/10 and she was given motrin this AM. Wound covered and dry. Medicated per orders, emotional support offered. Self inventory reviewed. She denies SI/HI and remains safe on level III obs.

## 2015-06-09 NOTE — Progress Notes (Signed)
Patient ID: Marcia Hood, female   DOB: 1980/08/25, 35 y.o.   MRN: [id number] [facility] MD Progress Note  06/09/2015 1:45 PM Marcia Hood  MRN:  [id number] Subjective:   Patient states she is feeling better . At this time is starting to focus more on discharge planning . Denies medication side effects at this time. States she has had " good " conversations with family/husband via phone and is feeling more supported .  Objective : I have reviewed case with treatment team and have met with patient . Patient presents with improving mood and range of affect . Describes feeling better, and is hoping to discharge soon, with a plan of following up with her outpatient psychiatrist at [facility]. Denies medication side effects No disruptive or agitated behaviors on unit , going to groups, interactive with peers, pleasant on approach.  Principal Problem: Severe episode of recurrent major depressive disorder, without psychotic features ([place]) Diagnosis:   Patient Active Problem List   Diagnosis Date Noted  . Severe episode of recurrent major depressive disorder, without psychotic features ([place]) [F33.2]   . ADHD (attention deficit hyperactivity disorder) [F90.9] 06/06/2015  . PTSD (post-traumatic stress disorder) [F43.10] 06/06/2015  . Marijuana abuse [F12.10] 06/06/2015  . Supervision of other normal pregnancy [Z34.80] 09/06/2012  . Maternal chronic hypertension [O10.919, I10] 08/12/2012  . High-risk pregnancy [O09.90] 05/16/2012  . 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 Hypertension   . Depression   . Anxiety   . Pregnant   . Migraine   . Chronic back pain     Past Surgical History  Procedure Laterality Date  . No past surgeries    . Cesarean section N/A 09/28/2012    Procedure: Primary cesarean section with delivery of baby girl at 55. ;  Surgeon:  Mora Bellman, MD;  Location: [facility];  Service: Obstetrics;  Laterality: N/A;  . Tubal ligation    . Carpal tunnel release     Family History:  Family History  Problem Relation Age of Onset  . Anesthesia problems Neg Hx   . Hypertension Mother   . Diabetes Father   . Heart attack Maternal Grandmother   . Cancer Maternal Grandmother     Bladder    Social History:  History  Alcohol Use No     History  Drug Use  . Yes  . Special: Benzodiazepines, Cocaine, Amphetamines, Marijuana    Comment: on Suboxone 8 mg; no other drugs.    Social History   Social History  . Marital Status: Married    Spouse Name: N/A  . Number of Children: N/A  . Years of Education: N/A   Social History Main Topics  . Smoking status: Current Every Day Smoker -- 0.50 packs/day for 10 years    Types: Cigarettes  . Smokeless tobacco: None  . Alcohol Use: No  . Drug Use: Yes    Special: Benzodiazepines, Cocaine, Amphetamines, Marijuana     Comment: on Suboxone 8 mg; no other drugs.  . Sexual Activity: Yes    Birth Control/ Protection: Surgical   Other Topics Concern  . None   Social History Narrative   Additional Social History:   Sleep: Good  Appetite:  Good  Current Medications: Current Facility-Administered Medications  Medication Dose Route Frequency Provider Last Rate Last Dose  . acetaminophen (TYLENOL) tablet 650 mg  650 mg Oral Q6H  PRN Niel Hummer, NP      . alum & mag hydroxide-simeth (MAALOX/MYLANTA) 200-200-20 MG/5ML suspension 30 mL  30 mL Oral Q4H PRN Niel Hummer, NP      . amphetamine-dextroamphetamine (ADDERALL) tablet 30 mg  30 mg Oral Daily Niel Hummer, NP   30 mg at 06/09/15 0747  . feeding supplement (ENSURE ENLIVE) (ENSURE ENLIVE) liquid 237 mL  237 mL Oral BID BM Myer Peer Cobos, MD   237 mL at 06/07/15 1437  . gabapentin (NEURONTIN) capsule 600 mg  600 mg Oral TID Niel Hummer, NP   600 mg at 06/09/15 1201  . ibuprofen (ADVIL,MOTRIN) tablet 600 mg  600 mg Oral  Q6H Niel Hummer, NP   600 mg at 06/09/15 1201  . magnesium hydroxide (MILK OF MAGNESIA) suspension 30 mL  30 mL Oral Daily PRN Niel Hummer, NP      . nicotine (NICODERM CQ - dosed in mg/24 hours) patch 21 mg  21 mg Transdermal Daily Jenne Campus, MD   21 mg at 06/09/15 0747  . ondansetron (ZOFRAN) tablet 4 mg  4 mg Oral Q8H PRN Niel Hummer, NP   4 mg at 06/07/15 0127  . pantoprazole (PROTONIX) EC tablet 40 mg  40 mg Oral Daily Niel Hummer, NP   40 mg at 06/09/15 0747  . rOPINIRole (REQUIP) tablet 1 mg  1 mg Oral QHS Niel Hummer, NP   1 mg at 06/08/15 2206  . sertraline (ZOLOFT) tablet 100 mg  100 mg Oral QHS Jenne Campus, MD   100 mg at 06/08/15 2206  . SUMAtriptan (IMITREX) injection 6 mg  6 mg Subcutaneous Daily PRN Niel Hummer, NP      . zolpidem (AMBIEN) tablet 5 mg  5 mg Oral QHS PRN Niel Hummer, NP   5 mg at 06/08/15 2256    Lab Results:  Results for orders placed or performed during the hospital encounter of 06/07/15 (from the past 48 hour(s))  TSH     Status: None   Collection Time: 06/08/15  6:13 AM  Result Value Ref Range   TSH 2.316 0.350 - 4.500 uIU/mL    Comment: Performed at [facility]    Blood Alcohol level:  Lab Results  Component Value Date   ETH <5 06/05/2015   ETH * 05/29/2010    175        LOWEST DETECTABLE LIMIT FOR SERUM ALCOHOL IS 5 mg/dL FOR MEDICAL PURPOSES ONLY    Physical Findings: AIMS: Facial and Oral Movements Muscles of Facial Expression: None, normal Lips and Perioral Area: None, normal Jaw: None, normal Tongue: None, normal,Extremity Movements Upper (arms, wrists, hands, fingers): None, normal Lower (legs, knees, ankles, toes): None, normal, Trunk Movements Neck, shoulders, hips: None, normal, Overall Severity Severity of abnormal movements (highest score from questions above): None, normal Incapacitation due to abnormal movements: None, normal Patient's awareness of abnormal movements (rate only  patient's report): No Awareness, Dental Status Current problems with teeth and/or dentures?: No Does patient usually wear dentures?: No  CIWA:    COWS:  COWS Total Score: 2  Musculoskeletal: Strength & Muscle Tone: within normal limits Gait & Station: normal Patient leans: N/A  Psychiatric Specialty Exam: ROS no headache, no chest pain, no shortness of breath, no rash   Blood pressure 116/66, pulse 89, temperature 98 F (36.7 C), temperature source Oral, resp. rate 16, height '5\' 4"'$  (1.626 m), weight 129 lb (  58.514 kg), last menstrual period 05/13/2015.Body mass index is 22.13 kg/(m^2).  General Appearance: Well Groomed  Engineer, water::  Good  Speech:  Normal Rate  Volume:  Normal  Mood:  Improving mood , less depressed   Affect:   Today more reactive, brighter   Thought Process:  Linear  Orientation:  Full (Time, Place, and Person)  Thought Content:   Less ruminative, no psychotic symptoms  Suicidal Thoughts:  No at this time denies any suicidal ideations, also denies any self injurious ideations, no homicidal ideations   Homicidal Thoughts:  No  Memory:  recent and remote grossly intact   Judgement:  Improving   Insight:   Improving   Psychomotor Activity:  Normal  Concentration:  Good  Recall:  Good  Fund of Knowledge:Good  Language: Good  Akathisia:  Negative  Handed:  Right  AIMS (if indicated):     Assets:  Communication Skills Desire for Improvement Resilience  ADL's:  Intact  Cognition: WNL  Sleep:  Number of Hours: 6.5  Assessment - Patient reports ongoing improvement, and presents with improving mood and range of affect. No active SI at this time . Tolerating medications well at this time.   Treatment Plan Summary: Daily contact with patient to assess and evaluate symptoms and progress in treatment, Medication management, Plan inpatient treatment and medications as below  Encourage ongoing group participation and milieu Continue Neurontin 600 mgrs TID for  management of pain and anxiety  Continue Adderall 30 mgrs QDAY for management of ADHD  Continue Ambien 5 mgrs QHS PRN for insomnia, as needed  Increase Zoloft to 100 mgrs QDAY for management of depression, anxiety, PTSD  Continue Requip 1 mgr QHS for management of Restless Leg Syndrome  Treatment team working on disposition planning options- patient plans to follow up as outpatient at [facility] .  Continue to encourage maintaining focus on recovery, sobriety .   Neita Garnet, MD 06/09/2015, 1:45 PM

## 2015-06-09 NOTE — Progress Notes (Signed)
Adult Psychoeducational Group Note  Date:  06/09/2015 Time:  9:41 PM  Group Topic/Focus:  Wrap-Up Group:   The focus of this group is to help patients review their daily goal of treatment and discuss progress on daily workbooks.  Participation Level:  Active  Participation Quality:  Appropriate and Attentive  Affect:  Appropriate  Cognitive:  Appropriate  Insight: Appropriate and Good  Engagement in Group:  Engaged  Modes of Intervention:  Discussion  Additional Comments:  Pt rated her day a 9 out 10. Pt was happy that her brother and her sister in law came. Pt goal for tomorrow is to leave and attend her mental health meeting.   Merlinda FrederickKeshia S Krystie Leiter 06/09/2015, 9:41 PM

## 2015-06-09 NOTE — Progress Notes (Signed)
Data Affect appropriate, occasionally anxious, mood "anxious."  Denies HI, SI, AVH.  Right hand incision observed appears to be unattached, stitches intact.  Got off phone before meds Patient states "I grabbed a book earlier today then I felt a shooting pain."  No redness or drainage observed, mild edema.  Pain worse (see flowsheet).  Patient states she has a splint she left at home.  Reports need for sleeping medication.   Action PA notified of change in incision, new orders received for spica cast, dressing replaced.  Requested patient have splint brought in.  Patient educated to avoid use of right hand. Remained on 15 minute checks.  [facility]  Scheduled ibuprofen given per order.  Response Still awaiting delivery of spica cast, patient asleep shortly after administration of sleep medicine.  Remains safe on unit.

## 2015-06-09 NOTE — Tx Team (Signed)
Interdisciplinary Treatment Plan Update (Adult) Date: 06/09/2015    Time Reviewed: 9:30 AM  Progress in Treatment: Attending groups: Yes Participating in groups: Yes Taking medication as prescribed: Yes Tolerating medication: Yes Family/Significant other contact made: Yes, CSW has spoken with patient's father Patient understands diagnosis: Yes Discussing patient identified problems/goals with staff: Yes Medical problems stabilized or resolved: Yes Denies suicidal/homicidal ideation: Yes Issues/concerns per patient self-inventory: Yes Other:  New problem(s) identified: N/A  Discharge Plan or Barriers: Plans to stay with brother or father to follow up with outpatient services.   Reason for Continuation of Hospitalization:  Depression Anxiety Medication Stabilization   Comments: N/A  Estimated length of stay: Discharge anticipated for 06/10/15    Patient is a 35 year old female who presented IVC'd by mother for suspected substance abuse and aggressive behaviors. Patient denies allegations. Patient will benefit from crisis stabilization, medication evaluation, group therapy and psycho education in addition to case management for discharge planning. At discharge, it is recommended that Pt remain compliant with established discharge plan and continued treatment.   Review of initial/current patient goals per problem list:  1. Goal(s): Patient will participate in aftercare plan   Met: Yes   Target date: 3-5 days post admission date   As evidenced by: Patient will participate within aftercare plan AEB aftercare provider and housing plan at discharge being identified.  5/8: Goal met. Patient plans to return home to follow up with outpatient providers.    2. Goal (s): Patient will exhibit decreased depressive symptoms and suicidal ideations.   Met: Yes   Target date: 3-5 days post admission date   As evidenced by: Patient will utilize self rating of depression at 3 or  below and demonstrate decreased signs of depression or be deemed stable for discharge by MD.  5/8: Goal met. Patient rates depression at 2, denies SI.    3. Goal(s): Patient will demonstrate decreased signs and symptoms of anxiety.   Met: Yes   Target date: 3-5 days post admission date   As evidenced by: Patient will utilize self rating of anxiety at 3 or below and demonstrated decreased signs of anxiety, or be deemed stable for discharge by MD  5/8: Goal met. Patient rates anxiety at 2.    4. Goal(s): Patient will demonstrate decreased signs of withdrawal due to substance abuse   Met: Yes   Target date: 3-5 days post admission date   As evidenced by: Patient will produce a CIWA/COWS score of 0, have stable vitals signs, and no symptoms of withdrawal  5/8: COWS score of 2, experiencing resting pulse rate and anxiety.  5/10: Goal met. No withdrawal symptoms reported at this time per medical chart.     Attendees: Patient:    Family:    Physician: Dr. Parke Poisson 06/09/2015 9:30 AM  Nursing: Loletta Specter, Darrol Angel, RN 06/09/2015 9:30 AM  Clinical Social Worker: Tilden Fossa, LCSW 06/09/2015 9:30 AM  Other: Maxie Better, LCSW  06/09/2015 9:30 AM  Other:  06/09/2015 9:30 AM  Other: Lars Pinks, Case Manager 06/09/2015 9:30 AM  Other: Andria Rhein, NP 06/09/2015 9:30 AM  Other:    Other:          Scribe for Treatment Team:  Tilden Fossa, Impact

## 2015-06-10 MED ORDER — IBUPROFEN 600 MG PO TABS: 600.0000 mg | ORAL_TABLET | Freq: Four times a day (QID) | ORAL | Status: DC

## 2015-06-10 MED ORDER — SERTRALINE HCL 100 MG PO TABS: 100.0000 mg | ORAL_TABLET | Freq: Every day | ORAL | Status: DC

## 2015-06-10 MED ORDER — ZOLPIDEM TARTRATE 5 MG PO TABS: 5.0000 mg | ORAL_TABLET | Freq: Every evening | ORAL | Status: AC | PRN

## 2015-06-10 MED ORDER — NICOTINE 21 MG/24HR TD PT24: 21.0000 mg | MEDICATED_PATCH | Freq: Every day | TRANSDERMAL | Status: DC

## 2015-06-10 MED ORDER — GABAPENTIN 300 MG PO CAPS: 600.0000 mg | ORAL_CAPSULE | Freq: Three times a day (TID) | ORAL | Status: AC

## 2015-06-10 MED ORDER — ZOLPIDEM TARTRATE 5 MG PO TABS: 5.0000 mg | ORAL_TABLET | Freq: Every evening | ORAL | Status: DC | PRN

## 2015-06-10 MED ORDER — OMEPRAZOLE 20 MG PO CPDR: 20.0000 mg | DELAYED_RELEASE_CAPSULE | Freq: Every day | ORAL | Status: DC

## 2015-06-10 MED ORDER — AMPHETAMINE-DEXTROAMPHETAMINE 30 MG PO TABS: 30.0000 mg | ORAL_TABLET | Freq: Every day | ORAL | Status: DC

## 2015-06-10 MED ORDER — SUMATRIPTAN SUCCINATE 6 MG/0.5ML ~~LOC~~ SOLN: 6.0000 mg | Freq: Every day | SUBCUTANEOUS | Status: DC | PRN

## 2015-06-10 MED ORDER — ROPINIROLE HCL 1 MG PO TABS: 1.0000 mg | ORAL_TABLET | Freq: Every day | ORAL | Status: AC

## 2015-06-10 NOTE — BHH Suicide Risk Assessment (Signed)
[facility] Discharge Suicide Risk Assessment   Principal Problem: Severe episode of recurrent major depressive disorder, without psychotic features [facility]) Discharge Diagnoses:  Patient Active Problem List   Diagnosis Date Noted  . Severe episode of recurrent major depressive disorder, without psychotic features ([facility]) [F33.2]   . ADHD (attention deficit hyperactivity disorder) [F90.9] 06/06/2015  . PTSD (post-traumatic stress disorder) [F43.10] 06/06/2015  . Marijuana abuse [F12.10] 06/06/2015  . Supervision of other normal pregnancy [Z34.80] 09/06/2012  . Maternal chronic hypertension [O10.919, I10] 08/12/2012  . High-risk pregnancy [O09.90] 05/16/2012  . 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 rate 16, height  (1.626 m), weight 129 lb (58.514 kg), last menstrual period 05/13/2015.Body mass index is 22.13 kg/(m^2).  General Appearance: Well Groomed  Eye Contact::  Good  Speech:  Normal Rate409  Volume:  Normal  Mood:  improved , presents euthymic   Affect:  Appropriate and Full Range  Thought Process:  Linear  Orientation:  Full (Time, Place, and Person)  Thought Content:  denies hallucinations, no delusions   Suicidal Thoughts:  No denies any suicidal or self injurious ideations  Homicidal Thoughts:  No denies any homicidal ideations   Memory:  recent and remote grossly intact   Judgement:  Other:  improved   Insight:  improved  Psychomotor Activity:  Normal  Concentration:  Good  Recall:  Good  Fund of Knowledge:Good  Language: Good  Akathisia:  No  Handed:  Right  AIMS (if  indicated):     Assets:  Communication Skills Desire for Improvement Resilience  Sleep:  Number of Hours: 6.5  Cognition: WNL  ADL's:  Intact   Mental Status Per Nursing Assessment::   On Admission:     Demographic Factors:  35 year old married female, three children, [employer]  Loss Factors: Relapse, marital stress/separation, CPS involvement- patient's mother has temporary custody  Historical Factors: History of depression, history of ADHD, history of alcohol abuse , cocaine abuse   Risk Reduction Factors:   Responsible for children under 35 years of age, Sense of responsibility to family, Positive social support and Positive coping skills or problem solving skills  Continued Clinical Symptoms:  At this time patient much improved compared to admission- mood improved , affect bright , no thought disorder, no SI , no HI, no psychotic symptoms, future oriented, 0x3. No medication side effects  Cognitive Features That Contribute To Risk:  No gross cognitive deficits noted upon discharge. Is alert , attentive, and oriented x 3   Suicide Risk:  Mild:  Suicidal ideation of limited frequency, intensity, duration, and specificity.  There are no identifiable plans, no associated intent, mild dysphoria and related symptoms, good self-control (both objective and subjective assessment), few other risk factors, and identifiable protective factors, including available and accessible social support.  Follow-up Information    Follow up with [facility] On 06/10/2015.   Why:  Outpatient Mental Health appt on Thursday May 11th at 2pm. Call office if you need to reschedule.    Contact information:   [address]- [address]st floor [place], [place] [address] [phone]       [facility]   Why:  Orthopedic appt on May 15th at 2:30pm. Substance Abuse appt on May 19th at 8:30am. Call office if you need to reschedule.    Contact information:   [address] [place], [place] [address] Phone: [phone]      Plan Of Care/Follow-up recommendations:  Activity:  as tolerated  Diet:  Regular Tests:  NA Other:  See below Patient is leaving in good spirits  Plans to go live with father for the time being Plans to follow up as above  Also [facility] [place] for [place] issues as needed  Encouraged 12 step program participation , maintaining focus on sobriety. Nehemiah MassedOBOS, Jenica Costilow, MD 06/10/2015, 10:04 AM

## 2015-06-10 NOTE — BHH Group Notes (Signed)
The focus of this group is to educate the patient on the purpose and policies of crisis stabilization and provide a format to answer questions about their admission.  The group details unit policies and expectations of patients while admitted.  Patient attended 0900 nurse education orientation group this morning.  Patient actively participated and had appropriate affect.  Patient is alert.  Patient has appropriate insight and appropriate engagement.  Today patient will work on 3 goals for discharge.    

## 2015-06-10 NOTE — Plan of Care (Signed)
Problem: Diagnosis: Increased Risk For Suicide Attempt Goal: STG-Patient Will Comply With Medication Regime Outcome: Progressing Pt complaint with medication regime     
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
## 2015-06-10 NOTE — Progress Notes (Signed)
Patient ID: Marcia ApplebaumCarolyn Hood, female   DOB: 1980-03-10, 35 y.o.   MRN: [id number] D: Patient observed watching TV and interacting well with peers on approach. Pt reports tolerating medication well. Denies  SI/HI/AVH.No behavioral issues noted.  A: Support and encouragement offered as needed. Medications administered as prescribed.  R: Patient cooperative and appropriate on unit. Will continue to monitor patient for safety and stability.

## 2015-06-10 NOTE — Progress Notes (Signed)
Discharge note:  Patient discharged home per MD order.  [facility]  Patient received all personal belongings from unit and locker.  Reviewed AVS/discharge instructions and patient indicated understanding.  Patient denies SI/HI/AVH.  She left in good spirits.  She received prescriptions of her medications.  She left ambulatory with her sister-in-law.

## 2015-08-22 ENCOUNTER — Encounter (HOSPITAL_COMMUNITY): Payer: Self-pay | Admitting: Emergency Medicine

## 2015-08-22 ENCOUNTER — Emergency Department (HOSPITAL_COMMUNITY)
Admission: EM | Admit: 2015-08-22 | Discharge: 2015-08-22 | Disposition: A | Discharge status: Home / Self Care | Payer: Non-veteran care | Attending: Emergency Medicine | Admitting: Emergency Medicine

## 2015-08-22 DIAGNOSIS — S0083XA Contusion of other part of head, initial encounter: Secondary | ICD-10-CM | POA: Diagnosis not present

## 2015-08-22 DIAGNOSIS — F17210 Nicotine dependence, cigarettes, uncomplicated: Secondary | ICD-10-CM | POA: Insufficient documentation

## 2015-08-22 DIAGNOSIS — Y9389 Activity, other specified: Secondary | ICD-10-CM | POA: Insufficient documentation

## 2015-08-22 DIAGNOSIS — Y929 Unspecified place or not applicable: Secondary | ICD-10-CM | POA: Insufficient documentation

## 2015-08-22 DIAGNOSIS — Y999 Unspecified external cause status: Secondary | ICD-10-CM | POA: Diagnosis not present

## 2015-08-22 DIAGNOSIS — W2209XA Striking against other stationary object, initial encounter: Secondary | ICD-10-CM | POA: Diagnosis not present

## 2015-08-22 DIAGNOSIS — I10 Essential (primary) hypertension: Secondary | ICD-10-CM | POA: Insufficient documentation

## 2015-08-22 DIAGNOSIS — S0993XA Unspecified injury of face, initial encounter: Secondary | ICD-10-CM | POA: Diagnosis present

## 2015-08-22 NOTE — ED Provider Notes (Signed)
[facility] DEPT Provider Note   CSN: [id number] Arrival date & time: 08/22/15  1053  First Provider Contact:  First MD Initiated Contact with Patient 08/22/15 12:13    By signing my name below, I, Levon Hedger, attest that this documentation has been prepared under the direction and in the presence of non-physician practitioner, Pauline Aus, PA-C. Electronically Signed: Levon Hedger, Scribe. 08/22/2015. 12:24 PM.   History   Chief Complaint Chief Complaint  Patient presents with  . Eye Injury    HPI Marcia [facility] is a 35 y.o. female complaining of sudden onset, gradually improving swelling  to right lower eyelid s/p hitting herself with a car door last night at 5 pm. Pt has used ice with some relief. She notes associated ecchymosis, right sided headache, photophobia. She denies nose bleed, dizziness, vomiting or loss of consciousness and visual changes. Pt states she does not wear contacts or glasses. She reports her Tetanus is UTD.   HPI  Past Medical History:  Diagnosis Date  . Anxiety   . Chronic back pain   . Depression   . Hypertension   . Migraine   . Pregnant     Patient Active Problem List   Diagnosis Date Noted  . Severe episode of recurrent major depressive disorder, without psychotic features (HCC)   . ADHD (attention deficit hyperactivity disorder) 06/06/2015  . PTSD (post-traumatic stress disorder) 06/06/2015  . Marijuana abuse 06/06/2015  . Supervision of other normal pregnancy 09/06/2012  . Maternal chronic hypertension 08/12/2012  . High-risk pregnancy 05/16/2012  . 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 CARPAL TUNNEL RELEASE    . CESAREAN SECTION N/A 09/28/2012   Procedure: Primary cesarean section with delivery of baby girl at 39. ;  Surgeon: Catalina Antigua, MD;  Location: [facility];  Service: Obstetrics;  Laterality: N/A;  . NO PAST SURGERIES    . TUBAL LIGATION      OB  History    Gravida Para Term Preterm AB Living   0 0 3   SAB TAB Ectopic Multiple Live Births   0 0 0 0         Home Medications    Prior to Admission medications   Medication Sig Start Date End Date Taking? Authorizing Provider  amphetamine-dextroamphetamine (ADDERALL) 30 MG tablet Take 1 tablet by mouth daily. For ADHD 06/10/15   Sanjuana Kava, NP  gabapentin (NEURONTIN) 300 MG capsule Take 2 capsules (600 mg total) by mouth 3 (three) times daily. For agitation 06/10/15   Sanjuana Kava, NP  ibuprofen (ADVIL,MOTRIN) 600 MG tablet Take 1 tablet (600 mg total) by mouth every 6 (six) hours. For moderate pain 06/10/15   Sanjuana Kava, NP  nicotine (NICODERM CQ - DOSED IN MG/24 HOURS) 21 mg/24hr patch Place 1 patch (21 mg total) onto the skin daily. For smoking cessation 06/10/15   Sanjuana Kava, NP  omeprazole (PRILOSEC) 20 MG capsule Take 1 capsule (20 mg total) by mouth daily. For acid reflux 06/10/15   Sanjuana Kava, NP  rOPINIRole (REQUIP) 1 MG tablet Take 1 tablet (1 mg total) by mouth at bedtime. For restless leg 06/10/15   Sanjuana Kava, NP  sertraline (ZOLOFT) 100 MG tablet Take 1 tablet (100 mg total) by mouth at bedtime. For depression 06/10/15   Sanjuana Kava, NP  SUMAtriptan (IMITREX) 6 MG/0.5ML SOLN injection Inject 0.5 mLs (6 mg total) into the skin daily  as needed (for migraine pain). May repeat in 2 hours if headache persists or recurs: For Migraine headache 06/10/15   Sanjuana Kava, NP  zolpidem (AMBIEN) 5 MG tablet Take 1 tablet (5 mg total) by mouth at bedtime as needed for sleep. 06/10/15   Sanjuana Kava, NP    Family History Family History  Problem Relation Age of Onset  . Hypertension Mother   . Diabetes Father   . Heart attack Maternal Grandmother   . Cancer Maternal Grandmother     Bladder  . Anesthesia problems Neg Hx     Social History Social History  Substance Use Topics  . Smoking status: Current Every Day Smoker    Packs/day: 0.50    Years: 10.00     Types: Cigarettes  . Smokeless tobacco: Never Used  . Alcohol use No     Allergies   Penicillins   Review of Systems Review of Systems  Constitutional: Negative for activity change and appetite change.  HENT: Positive for facial swelling. Negative for nosebleeds.   Eyes: Positive for photophobia. Negative for pain and visual disturbance.  Cardiovascular: Negative for chest pain.  Gastrointestinal: Negative for vomiting.  Musculoskeletal: Negative for arthralgias and neck pain.  Neurological: Positive for headaches. Negative for dizziness and syncope.  All other systems reviewed and are negative.    Physical Exam Updated Vital Signs BP 132/90   Pulse 87   Temp 98.7 F (37.1 C)   Resp 16   Ht 5\' 4"  (1.626 m)   Wt 59 kg   LMP 08/01/2015   SpO2 99%   BMI 22.31 kg/m   Physical Exam  Constitutional: She is oriented to person, place, and time. Vital signs are normal. She appears well-developed and well-nourished.  Non-toxic appearance. No distress.  Afebrile, nontoxic, NAD  HENT:  Head: Normocephalic and atraumatic.    Right Ear: Tympanic membrane and ear canal normal.  Left Ear: Tympanic membrane and ear canal normal.  Nose: No sinus tenderness. No epistaxis.  Mouth/Throat: Uvula is midline, oropharynx is clear and moist and mucous membranes are normal. No dental caries.  Eyes: Conjunctivae, EOM and lids are normal. Pupils are equal, round, and reactive to light. Right eye exhibits no discharge. Left eye exhibits no discharge. Right conjunctiva is not injected. Right conjunctiva has no hemorrhage. Left conjunctiva is not injected. Right eye exhibits normal extraocular motion and no nystagmus.  Ecchymosis to lower right periorbital region. Slight edema. Small abrasion. No hematoma.   Neck: Normal range of motion. Neck supple.  Cardiovascular: Normal rate and regular rhythm.  Exam reveals no gallop and no friction rub.   No murmur heard. Pulmonary/Chest: Effort normal and  breath sounds normal. No respiratory distress. She has no decreased breath sounds. She has no wheezes. She has no rhonchi. She has no rales.  Musculoskeletal: Normal range of motion.  Neurological: She is alert and oriented to person, place, and time. She has normal strength. No sensory deficit. Coordination normal.  Skin: Skin is warm, dry and intact. No rash noted.  Psychiatric: She has a normal mood and affect.  Nursing note and vitals reviewed.    ED Treatments / Results  DIAGNOSTIC STUDIES:  Oxygen Saturation is 99% on RA, normal by my interpretation.    COORDINATION OF CARE:  12:18 PM Pt declined X-ray.  Discussed treatment plan which includes ibuprofen 600 mg with pt at bedside and pt agreed to plan.  Labs (all labs ordered are listed, but only abnormal results are  displayed) Labs Reviewed - No data to display  EKG  EKG Interpretation None       Radiology No results found.  Procedures Procedures    Medications Ordered in ED Medications - No data to display   Initial Impression / Assessment and Plan / ED Course  I have reviewed the triage vital signs and the nursing notes.  Pertinent labs & imaging results that were available during my care of the patient were reviewed by me and considered in my medical decision making (see chart for details).  Clinical Course   Pt is well appearing.  No visual changes, no eye involvement.  Pt stable for d/c, agrees to return for worsening sx's   I personally performed the services described in this documentation, which was scribed in my presence. The recorded information has been reviewed and is accurate.   Final Clinical Impressions(s) / ED Diagnoses   Final diagnoses:  Facial contusion, initial encounter    New Prescriptions Discharge Medication List as of 08/22/2015 12:22 PM       Sanvika Cuttino Rowe Robert 08/23/15 2020    Mancel Bale, MD 08/24/15 1012

## 2015-08-22 NOTE — ED Notes (Signed)
Pt reports accidentally opening the edge of a car door and hitting under her right eye on the previous day at 5pm.  Pt states immediate swelling, bruising and bleeding.  Pt applied cold therapy throughout the night and took 600 mg of ibuprofen.

## 2015-08-22 NOTE — Discharge Instructions (Signed)
Continue 600 mg of ibuprofen every 6 hrs as needed for pain.  Ice packs on/off.  Try using an OTC lubricating eye drop as needed.

## 2015-08-22 NOTE — ED Triage Notes (Signed)
Pt c/o swelling to right lower eyelid. Pt reports accidentally hitting herself with car door. Small laceration noted. No bleeding at this time. Denies blurred vision.

## 2016-06-07 ENCOUNTER — Encounter (HOSPITAL_COMMUNITY): Payer: Self-pay | Admitting: Emergency Medicine

## 2016-06-07 ENCOUNTER — Emergency Department (HOSPITAL_COMMUNITY): Payer: Non-veteran care

## 2016-06-07 ENCOUNTER — Emergency Department (HOSPITAL_COMMUNITY)
Admission: EM | Admit: 2016-06-07 | Discharge: 2016-06-08 | Disposition: A | Discharge status: Home / Self Care | Payer: Non-veteran care | Source: Home / Self Care | Attending: Emergency Medicine | Admitting: Emergency Medicine

## 2016-06-07 DIAGNOSIS — F909 Attention-deficit hyperactivity disorder, unspecified type: Secondary | ICD-10-CM | POA: Insufficient documentation

## 2016-06-07 DIAGNOSIS — L02416 Cutaneous abscess of left lower limb: Secondary | ICD-10-CM | POA: Diagnosis not present

## 2016-06-07 DIAGNOSIS — L03116 Cellulitis of left lower limb: Secondary | ICD-10-CM | POA: Diagnosis not present

## 2016-06-07 DIAGNOSIS — Z79899 Other long term (current) drug therapy: Secondary | ICD-10-CM | POA: Insufficient documentation

## 2016-06-07 DIAGNOSIS — I10 Essential (primary) hypertension: Secondary | ICD-10-CM | POA: Insufficient documentation

## 2016-06-07 DIAGNOSIS — F17210 Nicotine dependence, cigarettes, uncomplicated: Secondary | ICD-10-CM

## 2016-06-07 HISTORY — DX: Other psychoactive substance abuse, uncomplicated: F19.10

## 2016-06-07 LAB — BASIC METABOLIC PANEL
Anion gap: 9 (ref 5–15)
BUN: 14 mg/dL (ref 6–20)
CO2: 22 mmol/L (ref 22–32)
Calcium: 9.3 mg/dL (ref 8.9–10.3)
Chloride: 103 mmol/L (ref 101–111)
Creatinine, Ser: 0.82 mg/dL (ref 0.44–1.00)
GFR calc Af Amer: >60 mL/min (ref >60)
GFR calc non Af Amer: >60 mL/min (ref >60)
Glucose, Bld: 113 mg/dL — ABNORMAL HIGH (ref 65–99)
Potassium: 3.5 mmol/L (ref 3.5–5.1)
Sodium: 134 mmol/L — ABNORMAL LOW (ref 135–145)

## 2016-06-07 LAB — CBC WITH DIFFERENTIAL/PLATELET
Basophils Absolute: 0 10*3/uL (ref 0.0–0.1)
Basophils Relative: 0 %
Eosinophils Absolute: 0.3 10*3/uL (ref 0.0–0.7)
Eosinophils Relative: 2 %
HCT: 33.9 % — ABNORMAL LOW (ref 36.0–46.0)
Hemoglobin: 11.6 g/dL — ABNORMAL LOW (ref 12.0–15.0)
Lymphocytes Relative: 13 %
Lymphs Abs: 2.1 10*3/uL (ref 0.7–4.0)
MCH: 31.5 pg (ref 26.0–34.0)
MCHC: 34.2 g/dL (ref 30.0–36.0)
MCV: 92.1 fL (ref 78.0–100.0)
Monocytes Absolute: 1.2 10*3/uL — ABNORMAL HIGH (ref 0.1–1.0)
Monocytes Relative: 7 %
Neutro Abs: 13 10*3/uL — ABNORMAL HIGH (ref 1.7–7.7)
Neutrophils Relative %: 78 %
Platelets: 242 10*3/uL (ref 150–400)
RBC: 3.68 MIL/uL — ABNORMAL LOW (ref 3.87–5.11)
RDW: 13.5 % (ref 11.5–15.5)
WBC: 16.6 10*3/uL — ABNORMAL HIGH (ref 4.0–10.5)

## 2016-06-07 LAB — POC URINE PREG, ED: Preg Test, Ur: NEGATIVE

## 2016-06-07 MED ORDER — CLINDAMYCIN PHOSPHATE 900 MG/50ML IV SOLN
900.0000 mg | Freq: Once | INTRAVENOUS | Status: AC
  Administered 2016-06-07: 900 mg via INTRAVENOUS
  Filled 2016-06-07: qty 50

## 2016-06-07 MED ORDER — HYDROMORPHONE HCL 1 MG/ML IJ SOLN
0.5000 mg | Freq: Once | INTRAMUSCULAR | Status: AC
  Administered 2016-06-07: 0.5 mg via INTRAVENOUS
  Filled 2016-06-07: qty 1

## 2016-06-07 MED ORDER — PROCHLORPERAZINE EDISYLATE 5 MG/ML IJ SOLN
5.0000 mg | Freq: Once | INTRAMUSCULAR | Status: AC
  Administered 2016-06-07: 5 mg via INTRAVENOUS
  Filled 2016-06-07: qty 2

## 2016-06-07 MED ORDER — ACETAMINOPHEN 500 MG PO TABS
1000.0000 mg | ORAL_TABLET | Freq: Once | ORAL | Status: AC
  Administered 2016-06-07: 1000 mg via ORAL

## 2016-06-07 MED ORDER — ACETAMINOPHEN 500 MG PO TABS
ORAL_TABLET | ORAL | Status: AC
  Filled 2016-06-07: qty 2

## 2016-06-07 MED ORDER — IOPAMIDOL (ISOVUE-300) INJECTION 61%
100.0000 mL | Freq: Once | INTRAVENOUS | Status: AC | PRN
  Administered 2016-06-07: 100 mL via INTRAVENOUS

## 2016-06-07 NOTE — ED Provider Notes (Signed)
[facility]-EMER[facility]Y DEPT Provider Note   CSN: [id number] Arrival date & time: 06/07/16  1851  By signing my name below, I, Marcia Hood, attest that this documentation has been prepared under the direction and in the presence of [employer].  Electronically Signed: Diona Hood, ED Scribe. 06/07/16. 9:42 PM.   History   Chief Complaint Chief Complaint  Patient presents with  . Insect Bite    HPI Marcia Hood is a 36 y.o. female who presents to the Emergency Department complaining of gradually worsening insect bite to her left outer thigh that occurred on Monday. She is unsure what bit her. Associates sx include fever and chills. Pt notes swelling has increased and spread. She describes pain as moderate-severe, sharp, and shooting. Pt denies cough, SOB or any other associated sx at this time.   The history is provided by the patient. No language interpreter was used.    Past Medical History:  Diagnosis Date  . Anxiety   . Chronic back pain   . Depression   . Hypertension   . Migraine   . Pregnant     Patient Active Problem List   Diagnosis Date Noted  . Severe episode of recurrent major depressive disorder, without psychotic features (HCC)   . ADHD (attention deficit hyperactivity disorder) 06/06/2015  . PTSD (post-traumatic stress disorder) 06/06/2015  . Marijuana abuse 06/06/2015  . Supervision of other normal pregnancy 09/06/2012  . Maternal chronic hypertension 08/12/2012  . High-risk pregnancy 05/16/2012  . 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 CARPAL TUNNEL RELEASE    . CESAREAN SECTION N/A 09/28/2012   Procedure: Primary cesarean section with delivery of baby girl at 40. ;  Surgeon: Catalina Antigua, MD;  Location: [facility];  Service: Obstetrics;  Laterality: N/A;  . NO PAST SURGERIES    . TUBAL LIGATION      OB History    Gravida Para Term Preterm AB Living   3 3 3  0 0 3   SAB TAB  Ectopic Multiple Live Births   0 0 0 0 3       Home Medications    Prior to Admission medications   Medication Sig Start Date End Date Taking? Authorizing Provider  amphetamine-dextroamphetamine (ADDERALL) 30 MG tablet Take 1 tablet by mouth daily. For ADHD 06/10/15   Armandina Stammer I, NP  gabapentin (NEURONTIN) 300 MG capsule Take 2 capsules (600 mg total) by mouth 3 (three) times daily. For agitation 06/10/15   Armandina Stammer I, NP  ibuprofen (ADVIL,MOTRIN) 600 MG tablet Take 1 tablet (600 mg total) by mouth every 6 (six) hours. For moderate pain 06/10/15   Armandina Stammer I, NP  nicotine (NICODERM CQ - DOSED IN MG/24 HOURS) 21 mg/24hr patch Place 1 patch (21 mg total) onto the skin daily. For smoking cessation 06/10/15   Armandina Stammer I, NP  omeprazole (PRILOSEC) 20 MG capsule Take 1 capsule (20 mg total) by mouth daily. For acid reflux 06/10/15   Armandina Stammer I, NP  rOPINIRole (REQUIP) 1 MG tablet Take 1 tablet (1 mg total) by mouth at bedtime. For restless leg 06/10/15   Armandina Stammer I, NP  sertraline (ZOLOFT) 100 MG tablet Take 1 tablet (100 mg total) by mouth at bedtime. For depression 06/10/15   Armandina Stammer I, NP  SUMAtriptan (IMITREX) 6 MG/0.5ML SOLN injection Inject 0.5 mLs (6 mg total) into the skin daily as needed (for migraine pain). May repeat in 2 hours  if headache persists or recurs: For Migraine headache 06/10/15   Armandina StammerNwoko, Agnes I, NP  zolpidem (AMBIEN) 5 MG tablet Take 1 tablet (5 mg total) by mouth at bedtime as needed for sleep. 06/10/15   Sanjuana KavaNwoko, Agnes I, NP    Family History Family History  Problem Relation Age of Onset  . Hypertension Mother   . Diabetes Father   . Heart attack Maternal Grandmother   . Cancer Maternal Grandmother     Bladder  . Anesthesia problems Neg Hx     Social History Social History  Substance Use Topics  . Smoking status: Current Every Day Smoker    Packs/day: 0.50    Years: 10.00    Types: Cigarettes  . Smokeless tobacco: Never Used  . Alcohol use  No     Allergies   Penicillins   Review of Systems Review of Systems  Constitutional: Positive for chills and fever.  Respiratory: Negative for cough and shortness of breath.   Musculoskeletal: Positive for myalgias.  All other systems reviewed and are negative.    Physical Exam Updated Vital Signs BP 130/64   Pulse 99   Temp (!) 100.4 F (38 C) (Oral)   Resp 18   Ht 5\' 4"  (1.626 m)   Wt 135 lb (61.2 kg)   LMP 05/12/2016   SpO2 100%   BMI 23.17 kg/m   Physical Exam  Constitutional: She appears well-developed and well-nourished. No distress.  HENT:  Head: Normocephalic and atraumatic.  Eyes: Conjunctivae are normal.  Neck: Normal range of motion.  Cardiovascular: Normal rate, regular rhythm and normal heart sounds.   Pulmonary/Chest: Effort normal and breath sounds normal.  Abdominal: Soft. Bowel sounds are normal. She exhibits no distension. There is no tenderness.  Musculoskeletal: Normal range of motion.  17.5 by 12.5 increased area of redness and swelling to left thigh with increased warmth.  Scabbed area in the center of the reddened area.  No other red areas appreciated on the extremity. DP 2+ bilat. No pitting edema.  Neurological: She is alert.  Skin: Capillary refill takes less than 2 seconds. No pallor.  Psychiatric: She has a normal mood and affect. Her behavior is normal.  Nursing note and vitals reviewed.    ED Treatments / Results  DIAGNOSTIC STUDIES: Oxygen Saturation is 100% on RA, normal by my interpretation.   COORDINATION OF CARE: 9:42 PM-Discussed next steps with pt. Pt verbalized understanding and is agreeable with the plan.    Labs (all labs ordered are listed, but only abnormal results are displayed) Labs Reviewed  CBC WITH DIFFERENTIAL/PLATELET - Abnormal; Notable for the following:       Result Value   WBC 16.6 (*)    RBC 3.68 (*)    Hemoglobin 11.6 (*)    HCT 33.9 (*)    Neutro Abs 13.0 (*)    Monocytes Absolute 1.2 (*)     All other components within normal limits  BASIC METABOLIC PANEL - Abnormal; Notable for the following:    Sodium 134 (*)    Glucose, Bld 113 (*)    All other components within normal limits  POC URINE PREG, ED    EKG  EKG Interpretation None       Radiology No results found.  Procedures Procedures (including critical care time)  Medications Ordered in ED Medications  HYDROmorphone (DILAUDID) injection 0.5 mg (not administered)  prochlorperazine (COMPAZINE) injection 5 mg (not administered)  acetaminophen (TYLENOL) tablet 1,000 mg (1,000 mg Oral Given 06/07/16 1903)  Initial Impression / Assessment and Plan / ED Course  I have reviewed the triage vital signs and the nursing notes.  Pertinent labs & imaging results that were available during my care of the patient were reviewed by me and considered in my medical decision making (see chart for details).       MDM 10:08 PM pt complains of an insect bit to the left thigh. She recently attempted to remove a scab and probe the bite area. She now has an area of cellulitis and/or abscess. Will obtain a ct scan to evaluate extent of cellulitis and/or abscess. Will receive iv antibiotics and pain medication. No previous hx of MRSA. Temperature improved from 100.4 298.2.  Urine pregnancy test is negative. Complete blood count shows a white blood cells to be elevated at 16,600. There is no shift to the left. The basic metabolic panel is essentially within normal limits. CT scan of the pelvis shows an infiltration in the subcutaneous fat and skin thickening lateral to the left hip consistent with cellulitis. There is no abscess appreciated at this time. No gas identified.  Patient will be treated with IV clindamycin in the emergency department. Prescription for oral clindamycin will be given. Patient will be asked to use warm tub soaks and will be reevaluated on Friday, May 11.  Final Clinical Impressions(s) / ED Diagnoses    Final diagnoses:  Cellulitis of left thigh    New Prescriptions New Prescriptions   CLINDAMYCIN (CLEOCIN) 300 MG CAPSULE    Take 1 capsule (300 mg total) by mouth 3 (three) times daily.   IBUPROFEN (ADVIL,MOTRIN) 600 MG TABLET    Take 1 tablet (600 mg total) by mouth 4 (four) times daily.   TRAMADOL (ULTRAM) 50 MG TABLET    Take 1 tablet (50 mg total) by mouth every 6 (six) hours as needed.   **I personally performed the services described in this documentation, which was scribed in my presence. The recorded information has been reviewed and is accurate.Ivery Quale, PA-C 06/08/16 0055    Samuel Jester, DO 06/11/16 1547

## 2016-06-07 NOTE — ED Triage Notes (Signed)
Pt c/o insect bite to the left thigh. The area is swollen, red and warm to the touch.

## 2016-06-08 MED ORDER — TRAMADOL HCL 50 MG PO TABS: 50.0000 mg | ORAL_TABLET | Freq: Four times a day (QID) | ORAL | 0 refills | Status: DC | PRN

## 2016-06-08 MED ORDER — IBUPROFEN 600 MG PO TABS: 600.0000 mg | ORAL_TABLET | Freq: Four times a day (QID) | ORAL | 0 refills | Status: DC

## 2016-06-08 MED ORDER — CLINDAMYCIN HCL 300 MG PO CAPS: 300.0000 mg | ORAL_CAPSULE | Freq: Three times a day (TID) | ORAL | 0 refills | Status: DC

## 2016-06-08 NOTE — Discharge Instructions (Signed)
Please use warm tub soaks daily for about 10-15 minutes. Please use clindamycin 3 times daily with a meal. Use ibuprofen with breakfast, lunch, dinner, and at bedtime. May use Ultram for more severe pain. Ultram may cause drowsiness, please use with caution. [facility], see the physicians at an urgent care, or return to the emergency department on Friday, May 11 for recheck of your cellulitis area.

## 2016-06-10 ENCOUNTER — Encounter (HOSPITAL_COMMUNITY): Payer: Self-pay

## 2016-06-10 ENCOUNTER — Emergency Department (HOSPITAL_COMMUNITY): Payer: Non-veteran care

## 2016-06-10 ENCOUNTER — Inpatient Hospital Stay (HOSPITAL_COMMUNITY)
Admission: EM | Admit: 2016-06-10 | Discharge: 2016-06-14 | DRG: 572 | Disposition: A | Discharge status: Home / Self Care | Payer: Non-veteran care | Attending: Internal Medicine | Admitting: Internal Medicine

## 2016-06-10 DIAGNOSIS — F17200 Nicotine dependence, unspecified, uncomplicated: Secondary | ICD-10-CM

## 2016-06-10 DIAGNOSIS — F17210 Nicotine dependence, cigarettes, uncomplicated: Secondary | ICD-10-CM | POA: Diagnosis present

## 2016-06-10 DIAGNOSIS — Z833 Family history of diabetes mellitus: Secondary | ICD-10-CM

## 2016-06-10 DIAGNOSIS — F909 Attention-deficit hyperactivity disorder, unspecified type: Secondary | ICD-10-CM | POA: Diagnosis present

## 2016-06-10 DIAGNOSIS — L03116 Cellulitis of left lower limb: Secondary | ICD-10-CM | POA: Diagnosis present

## 2016-06-10 DIAGNOSIS — I10 Essential (primary) hypertension: Secondary | ICD-10-CM | POA: Diagnosis present

## 2016-06-10 DIAGNOSIS — Z880 Allergy status to penicillin: Secondary | ICD-10-CM

## 2016-06-10 DIAGNOSIS — L02416 Cutaneous abscess of left lower limb: Secondary | ICD-10-CM | POA: Diagnosis present

## 2016-06-10 DIAGNOSIS — G8929 Other chronic pain: Secondary | ICD-10-CM | POA: Diagnosis present

## 2016-06-10 DIAGNOSIS — T63301A Toxic effect of unspecified spider venom, accidental (unintentional), initial encounter: Secondary | ICD-10-CM | POA: Diagnosis present

## 2016-06-10 DIAGNOSIS — M549 Dorsalgia, unspecified: Secondary | ICD-10-CM | POA: Diagnosis present

## 2016-06-10 DIAGNOSIS — F329 Major depressive disorder, single episode, unspecified: Secondary | ICD-10-CM | POA: Diagnosis present

## 2016-06-10 DIAGNOSIS — F4310 Post-traumatic stress disorder, unspecified: Secondary | ICD-10-CM | POA: Diagnosis present

## 2016-06-10 DIAGNOSIS — Z8249 Family history of ischemic heart disease and other diseases of the circulatory system: Secondary | ICD-10-CM

## 2016-06-10 LAB — BASIC METABOLIC PANEL
Anion gap: 9 (ref 5–15)
BUN: 10 mg/dL (ref 6–20)
CO2: 23 mmol/L (ref 22–32)
Calcium: 8.8 mg/dL — ABNORMAL LOW (ref 8.9–10.3)
Chloride: 102 mmol/L (ref 101–111)
Creatinine, Ser: 0.66 mg/dL (ref 0.44–1.00)
GFR calc Af Amer: >60 mL/min (ref >60)
GFR calc non Af Amer: >60 mL/min (ref >60)
Glucose, Bld: 102 mg/dL — ABNORMAL HIGH (ref 65–99)
Potassium: 3.5 mmol/L (ref 3.5–5.1)
Sodium: 134 mmol/L — ABNORMAL LOW (ref 135–145)

## 2016-06-10 LAB — CBC WITH DIFFERENTIAL/PLATELET
Basophils Absolute: 0 10*3/uL (ref 0.0–0.1)
Basophils Relative: 0 %
Eosinophils Absolute: 0.2 10*3/uL (ref 0.0–0.7)
Eosinophils Relative: 1 %
HCT: 31.6 % — ABNORMAL LOW (ref 36.0–46.0)
Hemoglobin: 10.7 g/dL — ABNORMAL LOW (ref 12.0–15.0)
Lymphocytes Relative: 9 %
Lymphs Abs: 1.4 10*3/uL (ref 0.7–4.0)
MCH: 31.3 pg (ref 26.0–34.0)
MCHC: 33.9 g/dL (ref 30.0–36.0)
MCV: 92.4 fL (ref 78.0–100.0)
Monocytes Absolute: 1.1 10*3/uL — ABNORMAL HIGH (ref 0.1–1.0)
Monocytes Relative: 7 %
Neutro Abs: 12.1 10*3/uL — ABNORMAL HIGH (ref 1.7–7.7)
Neutrophils Relative %: 83 %
Platelets: 240 10*3/uL (ref 150–400)
RBC: 3.42 MIL/uL — ABNORMAL LOW (ref 3.87–5.11)
RDW: 13.2 % (ref 11.5–15.5)
WBC: 14.8 10*3/uL — ABNORMAL HIGH (ref 4.0–10.5)

## 2016-06-10 MED ORDER — HYDROMORPHONE HCL 1 MG/ML IJ SOLN
1.0000 mg | INTRAMUSCULAR | Status: DC | PRN
  Administered 2016-06-10 – 2016-06-12 (×12): 1 mg via INTRAVENOUS
  Filled 2016-06-10 (×12): qty 1

## 2016-06-10 MED ORDER — MORPHINE SULFATE (PF) 4 MG/ML IV SOLN
4.0000 mg | Freq: Once | INTRAVENOUS | Status: AC
  Administered 2016-06-10: 4 mg via INTRAVENOUS
  Filled 2016-06-10: qty 1

## 2016-06-10 MED ORDER — ZOLPIDEM TARTRATE 5 MG PO TABS
5.0000 mg | ORAL_TABLET | Freq: Every evening | ORAL | Status: DC | PRN
  Administered 2016-06-11 – 2016-06-13 (×4): 5 mg via ORAL
  Filled 2016-06-10 (×4): qty 1

## 2016-06-10 MED ORDER — NICOTINE 14 MG/24HR TD PT24
14.0000 mg | MEDICATED_PATCH | Freq: Every day | TRANSDERMAL | Status: DC
  Administered 2016-06-10 – 2016-06-14 (×5): 14 mg via TRANSDERMAL
  Filled 2016-06-10 (×5): qty 1

## 2016-06-10 MED ORDER — MORPHINE SULFATE (PF) 2 MG/ML IV SOLN
2.0000 mg | Freq: Once | INTRAVENOUS | Status: AC
  Administered 2016-06-10: 2 mg via INTRAVENOUS
  Filled 2016-06-10: qty 1

## 2016-06-10 MED ORDER — ACETAMINOPHEN 650 MG RE SUPP: 650.0000 mg | Freq: Four times a day (QID) | RECTAL | Status: DC | PRN

## 2016-06-10 MED ORDER — PANTOPRAZOLE SODIUM 40 MG PO TBEC
40.0000 mg | DELAYED_RELEASE_TABLET | Freq: Every day | ORAL | Status: DC
  Administered 2016-06-10 – 2016-06-14 (×5): 40 mg via ORAL
  Filled 2016-06-10 (×5): qty 1

## 2016-06-10 MED ORDER — POLYETHYLENE GLYCOL 3350 17 G PO PACK: 17.0000 g | PACK | Freq: Every day | ORAL | Status: DC | PRN

## 2016-06-10 MED ORDER — VANCOMYCIN HCL IN DEXTROSE 1-5 GM/200ML-% IV SOLN: 1000.0000 mg | Freq: Two times a day (BID) | INTRAVENOUS | Status: DC

## 2016-06-10 MED ORDER — SODIUM CHLORIDE 0.9 % IV BOLUS (SEPSIS)
1000.0000 mL | Freq: Once | INTRAVENOUS | Status: AC
  Administered 2016-06-10: 1000 mL via INTRAVENOUS

## 2016-06-10 MED ORDER — ONDANSETRON HCL 4 MG PO TABS: 4.0000 mg | ORAL_TABLET | Freq: Four times a day (QID) | ORAL | Status: DC | PRN

## 2016-06-10 MED ORDER — MORPHINE SULFATE (PF) 10 MG/ML IV SOLN
6.0000 mg | INTRAVENOUS | Status: DC | PRN
  Administered 2016-06-10: 6 mg via INTRAVENOUS
  Filled 2016-06-10: qty 1

## 2016-06-10 MED ORDER — HYDROCODONE-ACETAMINOPHEN 5-325 MG PO TABS
1.0000 | ORAL_TABLET | ORAL | Status: DC | PRN
  Administered 2016-06-10 – 2016-06-13 (×10): 2 via ORAL
  Filled 2016-06-10 (×11): qty 2

## 2016-06-10 MED ORDER — AMPHETAMINE-DEXTROAMPHETAMINE 10 MG PO TABS
30.0000 mg | ORAL_TABLET | Freq: Every day | ORAL | Status: DC
  Administered 2016-06-10 – 2016-06-14 (×5): 30 mg via ORAL
  Filled 2016-06-10 (×5): qty 3

## 2016-06-10 MED ORDER — VANCOMYCIN HCL IN DEXTROSE 1-5 GM/200ML-% IV SOLN
1000.0000 mg | Freq: Two times a day (BID) | INTRAVENOUS | Status: DC
  Administered 2016-06-10 – 2016-06-11 (×3): 1000 mg via INTRAVENOUS
  Filled 2016-06-10 (×4): qty 200

## 2016-06-10 MED ORDER — ROPINIROLE HCL 1 MG PO TABS
1.0000 mg | ORAL_TABLET | Freq: Every day | ORAL | Status: DC
  Administered 2016-06-10 – 2016-06-13 (×4): 1 mg via ORAL
  Filled 2016-06-10 (×4): qty 1

## 2016-06-10 MED ORDER — GABAPENTIN 300 MG PO CAPS
600.0000 mg | ORAL_CAPSULE | Freq: Three times a day (TID) | ORAL | Status: DC
  Administered 2016-06-10 – 2016-06-14 (×12): 600 mg via ORAL
  Filled 2016-06-10 (×12): qty 2

## 2016-06-10 MED ORDER — BUPROPION HCL ER (SR) 150 MG PO TB12
150.0000 mg | ORAL_TABLET | Freq: Two times a day (BID) | ORAL | Status: DC
  Administered 2016-06-10 – 2016-06-14 (×8): 150 mg via ORAL
  Filled 2016-06-10 (×9): qty 1

## 2016-06-10 MED ORDER — ONDANSETRON HCL 4 MG/2ML IJ SOLN: 4.0000 mg | Freq: Four times a day (QID) | INTRAMUSCULAR | Status: DC | PRN

## 2016-06-10 MED ORDER — ACETAMINOPHEN 325 MG PO TABS
650.0000 mg | ORAL_TABLET | Freq: Four times a day (QID) | ORAL | Status: DC | PRN
  Administered 2016-06-11: 650 mg via ORAL
  Filled 2016-06-10: qty 2

## 2016-06-10 MED ORDER — VANCOMYCIN HCL IN DEXTROSE 1-5 GM/200ML-% IV SOLN
1000.0000 mg | Freq: Once | INTRAVENOUS | Status: AC
  Administered 2016-06-10: 1000 mg via INTRAVENOUS
  Filled 2016-06-10: qty 200

## 2016-06-10 NOTE — Progress Notes (Signed)
Pharmacy Antibiotic Note  Marcia ApplebaumCarolyn Hood is a 36 y.o. female admitted on 06/10/2016 with cellulitis.  Pharmacy has been consulted for Vancomycin dosing. Vancomycin 1 GM IV given in ED Plan: Vancomycin 1 GM IV every 12 hours Vancomycin trough at steady state Monitor renal function   Height: 5\' 4"  (162.6 cm) Weight: 135 lb (61.2 kg) IBW/kg (Calculated) : 54.7  Temp (24hrs), Avg:98.1 F (36.7 C), Min:98 F (36.7 C), Max:98.2 F (36.8 C)   Recent Labs Lab 06/07/16 1917 06/10/16 0915  WBC 16.6* 14.8*  CREATININE 0.82 0.66    Estimated Creatinine Clearance: 85.6 mL/min (by C-G formula based on SCr of 0.66 mg/dL).    Allergies  Allergen Reactions  . Penicillins Rash    Has patient had a PCN reaction causing immediate rash, facial/tongue/throat swelling, SOB or lightheadedness with hypotension: Yes Has patient had a PCN reaction causing severe rash involving mucus membranes or skin necrosis: No Has patient had a PCN reaction that required hospitalization Yes Has patient had a PCN reaction occurring within the last 10 years: No If all of the above answers are "NO", then may proceed with Cephalosporin use.     Antimicrobials this admission: Vancomycin 5/12  >>        Thank you for allowing pharmacy to be a part of this patient's care.  Josephine Igoittman, Caitland Porchia Bennett 06/10/2016 12:36 PM

## 2016-06-10 NOTE — ED Notes (Signed)
To CT

## 2016-06-10 NOTE — H&P (Signed)
History and Physical  Marcia Hood WUJ:[id number] DOB: 1980-12-06 DOA: 06/10/2016  PCP: Patient, No Pcp Per  Patient coming from: home  Chief Complaint: left leg pain  HPI:  36 year old woman presented to the emergency department with increasing left thigh pain and redness. Admitted for left thigh cellulitis with developing abscess.  5/7 the patient noticed a small raised area with what she thought was a black insect. She manipulated this area and the pride hydrogen peroxide but he continued to enlarge and become more painful. She was seen in the emergency department 5/9 and diagnosed with cellulitis. CT at that time showed no evidence of abscess. She was given a prescription for clindamycin which she has taken.  Despite this over the last 48 hours the area of redness has enlarged and she is not developing a central area raised above the skin with some small amount of drainage. It is severely painful, constant in nature, made worse by moving. No previous history of significant infection. She does have a history of drug use but is in NA and denies any drug use other than marijuana for some time now. No injection drug use.  ED Course: Afebrile, temperature 98.1, normal pulse, respirations, blood pressure, oxygenation on room air. Treated with morphine and normal saline.  Review of Systems:  Negative for fever, visual changes, sure throat, new muscle aches other than the left leg, chest pain, shortness of breath, dysuria, bleeding, nausea, vomiting, abdominal pain.   Past Medical History:  Diagnosis Date  . Anxiety   . Chronic back pain   . Depression   . Hypertension   . Migraine   . Polysubstance abuse     Past Surgical History:  Procedure Laterality Date  . CARPAL TUNNEL RELEASE    . CESAREAN SECTION N/A 09/28/2012   Procedure: Primary cesarean section with delivery of baby girl at 55. ;  Surgeon: Marcia Antigua, Marcia Hood;  Location: [facility];  Service: Obstetrics;  Laterality: N/A;    . TUBAL LIGATION       reports that she has been smoking Cigarettes.  She has a 5.00 pack-year smoking history. She has never used smokeless tobacco. She reports that she does not drink alcohol or use drugs. Mobility: Ambulatory  Allergies  Allergen Reactions  . Penicillins Rash    Has patient had a PCN reaction causing immediate rash, facial/tongue/throat swelling, SOB or lightheadedness with hypotension: Yes Has patient had a PCN reaction causing severe rash involving mucus membranes or skin necrosis: No Has patient had a PCN reaction that required hospitalization Yes Has patient had a PCN reaction occurring within the last 10 years: No If all of the above answers are "NO", then may proceed with Cephalosporin use.     Family History  Problem Relation Age of Onset  . Hypertension Mother   . Diabetes Father   . Heart attack Maternal Grandmother   . Cancer Maternal Grandmother        Bladder  . Anesthesia problems Neg Hx      Prior to Admission medications   Medication Sig Start Date End Date Taking? Authorizing Marcia Hood  amphetamine-dextroamphetamine (ADDERALL) 30 MG tablet Take 1 tablet by mouth daily. For ADHD 06/10/15  Yes Marcia Stammer Hood, Marcia Hood  buPROPion (WELLBUTRIN SR) 150 MG 12 hr tablet Take 150 mg by mouth 2 (two) times daily.   Yes Marcia Hood, Historical, Marcia Hood  clindamycin (CLEOCIN) 300 MG capsule Take 1 capsule (300 mg total) by mouth 3 (three) times daily. 06/08/16  Yes Marcia Quale,  Marcia Hood  gabapentin (NEURONTIN) 300 MG capsule Take 2 capsules (600 mg total) by mouth 3 (three) times daily. For agitation 06/10/15  Yes Marcia StammerNwoko, Agnes Hood, Marcia Hood  ibuprofen (ADVIL,MOTRIN) 600 MG tablet Take 1 tablet (600 mg total) by mouth 4 (four) times daily. Patient taking differently: Take 600 mg by mouth 3 (three) times daily as needed for mild pain.  06/08/16  Yes Marcia QualeBryant, Hobson, Marcia Hood  Multiple Vitamins-Minerals (MULTIVITAMINS THER. W/MINERALS) TABS tablet Take 1 tablet by mouth daily.   Yes  Marcia Hood, Historical, Marcia Hood  omeprazole (PRILOSEC) 20 MG capsule Take 1 capsule (20 mg total) by mouth daily. For acid reflux Patient taking differently: Take 40 mg by mouth daily. For acid reflux 06/10/15  Yes Nwoko, Nicole KindredAgnes Hood, Marcia Hood  rOPINIRole (REQUIP) 1 MG tablet Take 1 tablet (1 mg total) by mouth at bedtime. For restless leg 06/10/15  Yes Marcia StammerNwoko, Agnes Hood, Marcia Hood  SUMAtriptan (IMITREX) 25 MG tablet Take 25 mg by mouth every 2 (two) hours as needed for migraine. May repeat in 2 hours if headache persists or recurs.   Yes Marcia Hood, Historical, Marcia Hood  traMADol (ULTRAM) 50 MG tablet Take 1 tablet (50 mg total) by mouth every 6 (six) hours as needed. Patient taking differently: Take 50-100 mg by mouth every 6 (six) hours as needed for moderate pain.  06/08/16  Yes Marcia QualeBryant, Hobson, Marcia Hood  zolpidem (AMBIEN) 5 MG tablet Take 1 tablet (5 mg total) by mouth at bedtime as needed for sleep. Patient taking differently: Take 10 mg by mouth at bedtime as needed for sleep.  06/10/15  Yes Marcia StammerNwoko, Agnes Hood, Marcia Hood  SUMAtriptan (IMITREX) 6 MG/0.5ML SOLN injection Inject 0.5 mLs (6 mg total) into the skin daily as needed (for migraine pain). May repeat in 2 hours if headache persists or recurs: For Migraine headache Patient not taking: Reported on 06/10/2016 06/10/15   Marcia StammerNwoko, Agnes Hood, Marcia Hood    Physical Exam: Afebrile, temperature 98.2, respirations 16, pulse 70, blood pressure 111/72, 97% on room air  Constitutional: Appears mildly uncomfortable. Nontoxic. Calm.  Eyes: Pupils, irises, lids appear normal.  ENT: Grossly normal hearing. Lips and tongue appear unremarkable. Good dentition. Heart and soft palates appear normal.  Neck: No lymphadenopathy or masses. No thyromegaly.  Cardiovascular: Regular rate and rhythm. No murmur, rub or gallop. No lower extremity edema. Normal 2+ dorsalis pedis pulse left foot.  Respiratory: Clear to auscultation bilaterally. No wheezes, rales or rhonchi. Normal respiratory effort.  Abdomen soft,  nontender, nondistended  Skin: There is a large area of erythema on her left lateral thigh with a silver dollar sized area in the center that is raised, ulcerated and with a small amount of drainage. The area of erythema is exquisitely tender to palpation. There is a small amount of fluctuance in the center. No nodules are seen elsewhere. The lower left leg appears normal.  Musculoskeletal: Grossly normal tone and strength all extremities. No deformities noted. Digits and nails of the upper lip tremors appear normal.  Psychiatric: Grossly normal mood and affect. Speech fluent and appropriate.  Wt Readings from Last 3 Encounters:  06/10/16 61.2 kg (135 lb)  06/07/16 61.2 kg (135 lb)  08/22/15 59 kg (130 lb)    Hood have personally reviewed following labs and imaging studies  Labs:   Basic metabolic panel unremarkable  WBC 14.8, hemoglobin 10.7, platelets 240  Urine pregnancy 5/9  Imaging studies:   5/9 pelvic CT: Infiltration of fat, skin thickening lateral to left hip consistent with cellulitis. No abscess or  soft tissue gas identified  5/12 pelvic CT: Worsening cellulitis, developing phlegmon/abscess  Medical tests:     Test discussed with performing physician:     Decision to obtain old records:     Review and summation of old records:   5/9 ED visit: insect bite left thigh with associated fever/chills. WBC 16,000. CT scan pelvis showed left hip cellulitis without abscess or gas. Treated with clindamycin.  Principal Problem:   Left leg cellulitis Active Problems:   ADHD (attention deficit hyperactivity disorder)   PTSD (post-traumatic stress disorder)   Nicotine dependence   Assessment/Plan #1: Left thigh purulent cellulitis with developing abscess, failed outpatient treatment with clindamycin. Afebrile, no leukocytosis, hemodynamics stable. However given failure of outpatient antibiotics, rapidly progressing cellulitis and developing abscess, treat  aggressively. -IV vancomycin -Gen. surgery consultation for consideration of incision and drainage 5/13. -Oral and IV analgesics as needed -Screen HIV  #2: Nicotine dependence, cigarettes -Nicotine patch  -Will recommend cessation   #3: Chronic back pain -Continue gabapentin  #4: PTSD, ADHD, depression -Continue Wellbutrin, Adderall, Requip   It is my clinical opinion that admission to INPATIENT is reasonable and necessary in this patient  Given the aforementioned, the predictability of an adverse outcome is felt to be significant. Hood expect that the patient will require at least 2 midnights in the hospital to treat this condition.   DVT prophylaxis:SCDs Code Status: full Family Communication: none     Time spent: 50 minutes  Brendia Sacks, Marcia Hood  [facility] Direct contact: [phone] --Via amion app OR  --[email or web address]; password [email or web address]  7PM-7AM contact night coverage as above  06/10/2016, 12:24 PM

## 2016-06-10 NOTE — ED Notes (Signed)
Report to Lauren, RN

## 2016-06-10 NOTE — ED Triage Notes (Signed)
Pt reports had cellulitis on left thigh from a spider bite and was treated with antibiotics Wednesday.  Pt back today because infection is worse.  Redness has spread beyond the lines drawn on Wednesday and pt says pain is much worse.

## 2016-06-10 NOTE — ED Notes (Signed)
Wound covered with a telfa dressing and abdominal pad.

## 2016-06-10 NOTE — ED Provider Notes (Signed)
[facility] DEPT Provider Note   CSN: [id number] Arrival date & time: 06/10/16  [id number]     History   Chief Complaint Chief Complaint  Patient presents with  . Wound Infection    HPI Marcia ApplebaumCarolyn Hood is a 36 y.o. female who presents with worsening left thigh pain, redness and swelling from a pre-existing spider bite. [facility] was [facility] on 06/07/16 for initial symptoms. Chest CT pelvis that showed signs of cellulitis but no evidence of abscess. She was given IV clindamycin in the department and sent home with an oral prescription for clindamycin, tramadol and ibuprofen. Patient reports that 2 days ago she started noticing that the redness started increasing beyond the marker outline. She also states that she noticed some drainage on the bandage last night. She states that over the last 2 days pain has become increasingly more severe and has been it difficult to walk secondary to pain. She reports fever of 100.4 last night. She states that she has been taking the antibiotic as prescribed. She denies any other associated symptoms. She is a current cigarette smoker and states that she smokes 2 cigarettes a day.  The history is provided by the patient.    Past Medical History:  Diagnosis Date  . Anxiety   . Chronic back pain   . Depression   . Hypertension   . Migraine   . Polysubstance abuse     Patient Active Problem List   Diagnosis Date Noted  . Left leg cellulitis 06/10/2016  . Nicotine dependence 06/10/2016  . Severe episode of recurrent major depressive disorder, without psychotic features (HCC)   . ADHD (attention deficit hyperactivity disorder) 06/06/2015  . PTSD (post-traumatic stress disorder) 06/06/2015  . Marijuana abuse 06/06/2015  . Supervision of other normal pregnancy 09/06/2012  . Maternal chronic hypertension 08/12/2012  . High-risk pregnancy 05/16/2012  . 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 CARPAL TUNNEL RELEASE    . CESAREAN SECTION N/A 09/28/2012   Procedure: Primary cesarean section with delivery of baby girl at [id number]. ;  Surgeon: Catalina AntiguaPeggy Constant, MD;  Location: [facility];  Service: Obstetrics;  Laterality: N/A;  . TUBAL LIGATION      OB History    Gravida Para Term Preterm AB Living   3 3 3  0 0 3   SAB TAB Ectopic Multiple Live Births   0 0 0 0 3       Home Medications    Prior to Admission medications   Medication Sig Start Date End Date Taking? Authorizing Provider  amphetamine-dextroamphetamine (ADDERALL) 30 MG tablet Take 1 tablet by mouth daily. For ADHD 06/10/15  Yes Armandina StammerNwoko, Agnes I, NP  buPROPion (WELLBUTRIN SR) 150 MG 12 hr tablet Take 150 mg by mouth 2 (two) times daily.   Yes [provider]  clindamycin (CLEOCIN) 300 MG capsule Take 1 capsule (300 mg total) by mouth 3 (three) times daily. 06/08/16  Yes Ivery QualeBryant, Hobson, PA-C  gabapentin (NEURONTIN) 300 MG capsule Take 2 capsules (600 mg total) by mouth 3 (three) times daily. For agitation 06/10/15  Yes Armandina StammerNwoko, Agnes I, NP  ibuprofen (ADVIL,MOTRIN) 600 MG tablet Take 1 tablet (600 mg total) by mouth 4 (four) times daily. Patient taking differently: Take 600 mg by mouth 3 (three) times daily as needed for mild pain.  06/08/16  Yes Ivery QualeBryant, Hobson, PA-C  Multiple Vitamins-Minerals (MULTIVITAMINS THER. W/MINERALS) TABS tablet Take 1 tablet by mouth daily.  Yes [provider]  omeprazole (PRILOSEC) 20 MG capsule Take 1 capsule (20 mg total) by mouth daily. For acid reflux Patient taking differently: Take 40 mg by mouth daily. For acid reflux 06/10/15  Yes Nwoko, Nicole Kindred I, NP  rOPINIRole (REQUIP) 1 MG tablet Take 1 tablet (1 mg total) by mouth at bedtime. For restless leg 06/10/15  Yes Armandina Stammer I, NP  SUMAtriptan (IMITREX) 25 MG tablet Take 25 mg by mouth every 2 (two) hours as needed for migraine. May repeat in 2 hours if headache persists or recurs.   Yes [provider]  traMADol  (ULTRAM) 50 MG tablet Take 1 tablet (50 mg total) by mouth every 6 (six) hours as needed. Patient taking differently: Take 50-100 mg by mouth every 6 (six) hours as needed for moderate pain.  06/08/16  Yes Ivery Quale, PA-C  zolpidem (AMBIEN) 5 MG tablet Take 1 tablet (5 mg total) by mouth at bedtime as needed for sleep. Patient taking differently: Take 10 mg by mouth at bedtime as needed for sleep.  06/10/15  Yes Armandina Stammer I, NP  SUMAtriptan (IMITREX) 6 MG/0.5ML SOLN injection Inject 0.5 mLs (6 mg total) into the skin daily as needed (for migraine pain). May repeat in 2 hours if headache persists or recurs: For Migraine headache Patient not taking: Reported on 06/10/2016 06/10/15   Sanjuana Kava, NP    Family History Family History  Problem Relation Age of Onset  . Hypertension Mother   . Diabetes Father   . Heart attack Maternal Grandmother   . Cancer Maternal Grandmother        Bladder  . Anesthesia problems Neg Hx     Social History Social History  Substance Use Topics  . Smoking status: Current Every Day Smoker    Packs/day: 0.50    Years: 10.00    Types: Cigarettes  . Smokeless tobacco: Never Used  . Alcohol use No     Allergies   Penicillins   Review of Systems Review of Systems  Constitutional: Positive for fever. Negative for chills.  HENT: Negative for congestion.   Respiratory: Negative for shortness of breath.   Cardiovascular: Negative for chest pain.  Gastrointestinal: Negative for abdominal pain, nausea and vomiting.  Genitourinary: Negative for dysuria and hematuria.  Skin: Positive for color change and wound.  Neurological: Negative for weakness, numbness and headaches.  All other systems reviewed and are negative.    Physical Exam Updated Vital Signs BP 111/72 (BP Location: Right Arm)   Pulse 78   Temp 98.2 F (36.8 C) (Oral)   Resp 16   Ht 5\' 4"  (1.626 m)   Wt 61.2 kg   LMP 05/12/2016   SpO2 97%   BMI 23.17 kg/m   Physical Exam    Constitutional: She is oriented to person, place, and time. She appears well-developed and well-nourished.  Appears uncomfortable on examination table.  HENT:  Head: Normocephalic and atraumatic.  Mouth/Throat: Oropharynx is clear and moist. Mucous membranes are dry.  Eyes: Conjunctivae, EOM and lids are normal. Pupils are equal, round, and reactive to light.  Neck: Full passive range of motion without pain.  Cardiovascular: Normal rate, regular rhythm, normal heart sounds and normal pulses.   Pulses:      Dorsalis pedis pulses are 2+ on the right side, and 2+ on the left side.  Pulmonary/Chest: Effort normal and breath sounds normal.  Musculoskeletal: Normal range of motion.  Neurological: She is alert and oriented to person, place,  and time.  Skin: Skin is warm and dry. Capillary refill takes less than 2 seconds.  Left lateral thigh with 20 cm x 15 cm area of erythema warmth, induration (see photo). Tenderness palpation of left lateral thigh. Left lateral thigh is tense and edematous. No fluctuance noted.   Psychiatric: She has a normal mood and affect. Her speech is normal.  Nursing note and vitals reviewed.        ED Treatments / Results  Labs (all labs ordered are listed, but only abnormal results are displayed) Labs Reviewed  BASIC METABOLIC PANEL - Abnormal; Notable for the following:       Result Value   Sodium 134 (*)    Glucose, Bld 102 (*)    Calcium 8.8 (*)    All other components within normal limits  CBC WITH DIFFERENTIAL/PLATELET - Abnormal; Notable for the following:    WBC 14.8 (*)    RBC 3.42 (*)    Hemoglobin 10.7 (*)    HCT 31.6 (*)    Neutro Abs 12.1 (*)    Monocytes Absolute 1.1 (*)    All other components within normal limits    EKG  EKG Interpretation None       Radiology Ct Pelvis Wo Contrast  Result Date: 06/10/2016 CLINICAL DATA:  Worsening left thigh cellulitis. EXAM: CT PELVIS WITHOUT CONTRAST TECHNIQUE: Multidetector CT imaging of  the pelvis was performed following the standard protocol without intravenous contrast. COMPARISON:  06/07/2016. FINDINGS: Urinary Tract:  Negative. Bowel: Visualized portions of the small bowel, appendix and colon are unremarkable. Vascular/Lymphatic: Vascular structures are unremarkable. Low aortocaval lymph node measures 10 mm, incompletely imaged. Left inguinal lymph nodes measure up to 11 mm. Reproductive: Uterus is visualized. No adnexal mass. Tubal ligation clips. Other:  Small free fluid. Musculoskeletal: Skin thickening and subcutaneous stranding extend for a length of approximately 23.5 cm along the lateral left thigh. There are developing areas of phlegmon deep to a small bulge in the overlying skin (coronal image 59) with the largest individual collection measuring approximately 2.9 cm. Osseous structures are unremarkable. IMPRESSION: Worsening cellulitis along the left thigh with developing phlegmon/abscess just deep to the skin surface, where there is a focal area of skin bulging, which should be amenable to direct visualization. Electronically Signed   By: Leanna Battles M.D.   On: 06/10/2016 11:49    Procedures Procedures (including critical care time)  Medications Ordered in ED Medications  buPROPion [facility] SR) 12 hr tablet 150 mg (150 mg Oral Not Given 06/10/16 1300)  amphetamine-dextroamphetamine (ADDERALL) tablet 30 mg (30 mg Oral Given 06/10/16 1333)  gabapentin (NEURONTIN) capsule 600 mg (600 mg Oral Given 06/10/16 1535)  pantoprazole (PROTONIX) EC tablet 40 mg (40 mg Oral Given 06/10/16 1333)  rOPINIRole (REQUIP) tablet 1 mg (not administered)  zolpidem (AMBIEN) tablet 5 mg (not administered)  acetaminophen (TYLENOL) tablet 650 mg (not administered)    Or  acetaminophen (TYLENOL) suppository 650 mg (not administered)  HYDROcodone-acetaminophen (NORCO/VICODIN) 5-325 MG per tablet 1-2 tablet (not administered)  ondansetron (ZOFRAN) tablet 4 mg (not administered)    Or    ondansetron (ZOFRAN) injection 4 mg (not administered)  polyethylene glycol (MIRALAX / GLYCOLAX) packet 17 g (not administered)  nicotine (NICODERM CQ - dosed in mg/24 hours) patch 14 mg (14 mg Transdermal Patch Applied 06/10/16 1333)  vancomycin (VANCOCIN) IVPB 1000 mg/200 mL premix (not administered)  HYDROmorphone (DILAUDID) injection 1 mg (not administered)  sodium chloride 0.9 % bolus 1,000 mL (0 mLs Intravenous Stopped  06/10/16 1115)  morphine 4 MG/ML injection 4 mg (4 mg Intravenous Given 06/10/16 0923)  morphine 2 MG/ML injection 2 mg (2 mg Intravenous Given 06/10/16 1115)  vancomycin (VANCOCIN) IVPB 1000 mg/200 mL premix (0 mg Intravenous Stopped 06/10/16 1325)     Initial Impression / Assessment and Plan / ED Course  I have reviewed the triage vital signs and the nursing notes.  Pertinent labs & imaging results that were available during my care of the patient were reviewed by me and considered in my medical decision making (see chart for details).     36 year old female presents with increasing pain, swelling, redness to left frontal thigh after a spider bite 3 days ago. She was initially seen in the ED on 06/07/16 and was treated with IV clindamycin in department and given a prescription for by mouth clindamycin. Noticed that redness started spreading outside of the marker lines 2 days ago and has since had worsening pain, despite oral tramadol. She states she has been compliant with her clindamycin. Physical exam shows 20 cm x 15 cm area of erythema, warmth, and induration that is extended beyond the marker outline that was placed in her previous emergency department visit. Will plan to check basic labs including CBC and BMP. Pain medication given in the department for pain relief. Will likely require admission for IV antibiotics.   Labs reviewed. White blood cell count is 14.4 which is down from 16 on May 9. CMP within normal limits. Will obtain CT pelvis to evaluate for presence of  subcutaneous gas or abscess present.  Consult to hospitalist placed.   Discussed with hospitalist. Will admit patient for further evaluation and IV antibiotics. Will start Vancomycin in the department   Final Clinical Impressions(s) / ED Diagnoses   Final diagnoses:  Cellulitis of left lower extremity    New Prescriptions Current Discharge Medication List       Rosana Hoes 06/10/16 1547    Mesner, Barbara Cower, MD 06/11/16 [phone]

## 2016-06-11 DIAGNOSIS — F900 Attention-deficit hyperactivity disorder, predominantly inattentive type: Secondary | ICD-10-CM

## 2016-06-11 LAB — BASIC METABOLIC PANEL
Anion gap: 8 (ref 5–15)
BUN: 6 mg/dL (ref 6–20)
CO2: 25 mmol/L (ref 22–32)
Calcium: 9 mg/dL (ref 8.9–10.3)
Chloride: 103 mmol/L (ref 101–111)
Creatinine, Ser: 0.54 mg/dL (ref 0.44–1.00)
GFR calc Af Amer: >60 mL/min (ref >60)
GFR calc non Af Amer: >60 mL/min (ref >60)
Glucose, Bld: 110 mg/dL — ABNORMAL HIGH (ref 65–99)
Potassium: 3.4 mmol/L — ABNORMAL LOW (ref 3.5–5.1)
Sodium: 136 mmol/L (ref 135–145)

## 2016-06-11 LAB — CBC
HCT: 33.7 % — ABNORMAL LOW (ref 36.0–46.0)
Hemoglobin: 11.4 g/dL — ABNORMAL LOW (ref 12.0–15.0)
MCH: 31.1 pg (ref 26.0–34.0)
MCHC: 33.8 g/dL (ref 30.0–36.0)
MCV: 92.1 fL (ref 78.0–100.0)
Platelets: 234 10*3/uL (ref 150–400)
RBC: 3.66 MIL/uL — ABNORMAL LOW (ref 3.87–5.11)
RDW: 13.1 % (ref 11.5–15.5)
WBC: 10.9 10*3/uL — ABNORMAL HIGH (ref 4.0–10.5)

## 2016-06-11 MED ORDER — CHLORHEXIDINE GLUCONATE CLOTH 2 % EX PADS
6.0000 | MEDICATED_PAD | Freq: Once | CUTANEOUS | Status: AC
  Administered 2016-06-12: 6 via TOPICAL

## 2016-06-11 MED ORDER — POTASSIUM CHLORIDE CRYS ER 20 MEQ PO TBCR
40.0000 meq | EXTENDED_RELEASE_TABLET | Freq: Once | ORAL | Status: AC
  Administered 2016-06-11: 40 meq via ORAL
  Filled 2016-06-11: qty 2

## 2016-06-11 MED ORDER — CHLORHEXIDINE GLUCONATE CLOTH 2 % EX PADS
6.0000 | MEDICATED_PAD | Freq: Once | CUTANEOUS | Status: AC
  Administered 2016-06-11: 6 via TOPICAL

## 2016-06-11 MED ORDER — MUPIROCIN CALCIUM 2 % EX CREA
TOPICAL_CREAM | Freq: Two times a day (BID) | CUTANEOUS | Status: DC
  Administered 2016-06-11 – 2016-06-12 (×3): via TOPICAL
  Filled 2016-06-11: qty 15

## 2016-06-11 NOTE — Progress Notes (Signed)
  PROGRESS NOTE  Ladon ApplebaumCarolyn Townsend ZOX:[id number] DOB: [birth year] DOA: 06/10/2016 PCP: Patient, No Pcp Per  Brief Narrative: 36 year old woman presented to the emergency department with increasing left thigh pain and redness. Admitted for left thigh cellulitis with developing abscess.  Assessment/Plan #1: Left thigh purulent cellulitis with developing abscess as seen on CT. Failed outpatient therapy with clindamycin. No previous history of the same. -Erythema somewhat improved, however abscess appears to be larger. WBC trending down. Afebrile. Pain is well controlled and overall the patient appears more comfortable. -Gen. surgery consultation pending Continue oral and IV analgesics as needed -Screening HIV pending  #2: Nicotine dependence, cigarettes -Continue nicotine patch  -Recommend cessation on discharge  3: Chronic back pain -Stable. Continue gabapentin.  #4: PTSD, ADHD, depression -Stable. Continue Wellbutrin, Adderall, Requip   Some improvement. Continue IV vancomycin. Follow-up surgical recommendations.   DVT prophylaxis: SCDs Code Status: full Family Communication: none Disposition Plan: home    Brendia Sacksaniel Goodrich, MD  [facility] Direct contact: [phone] --Via amion app OR  --[email or web address]; password [email or web address]  7PM-7AM contact night coverage as above 06/11/2016, 9:34 AM  LOS: 1 day   Consultants:  General surgery  Procedures:    Antimicrobials:  Vancomycin IV 5/12 >>  Interval history/Subjective: Slept well. Pain level about the same, analgesic effective. There has been some drainage from the wound, there is less erythema.  Objective: Vitals: Afebrile, temperature 98.4, respirations 17, pulse 84, blood pressure 126/74, SPO2 100% on room air  Exam:     Constitutional: Ambulating with mild pain. Appears calm, comfortable.  Eyes: Pupils, irises, lids appear normal.  ENT: Grossly normal hearing. Lips and tongue appear  unremarkable.  Cardiovascular: Regular rate and rhythm. No murmur, rub or gallop. No lower extremity edema. Left foot dorsalis pedis pulse 2+.  Respiratory: Clear to auscultation bilaterally. No wheezes, rales or rhonchi. Normal respiratory effort.  Skin: Erythema without significant change in size or distribution, however less intense today, more pink. The protuberance and ulcerated skin appears to be larger in size and there is some spontaneous exudate noted. There is some fluctuance and exquisite tenderness with palpation. Remainder of the leg appears unremarkable.   I have personally reviewed the following:   Labs:  Potassium 3.4. Remainder basic metabolic panel unremarkable.  WBC 14.8 >> 10.9  Hemoglobin and platelet count stable, 11.4, 234 respectively.  Imaging studies:    Medical tests:    Test discussed with performing physician:    Decision to obtain old records:    Review and summation of old records:    Scheduled Meds: . amphetamine-dextroamphetamine  30 mg Oral Daily  . buPROPion  150 mg Oral BID  . gabapentin  600 mg Oral TID  . nicotine  14 mg Transdermal Daily  . pantoprazole  40 mg Oral Daily  . potassium chloride  40 mEq Oral Once  . rOPINIRole  1 mg Oral QHS   Continuous Infusions: . vancomycin 1,000 mg (06/10/16 2245)    Principal Problem:   Left leg cellulitis Active Problems:   ADHD (attention deficit hyperactivity disorder)   PTSD (post-traumatic stress disorder)   Nicotine dependence   LOS: 1 day

## 2016-06-11 NOTE — Consult Note (Signed)
Reason for Consult: Left thigh cellulitis with abscess Referring Physician: Dr. [name] is an 36 y.o. female.  HPI: Patient is a 36 year old white female who was seen in the emergency room on 06/07/2016 for cellulitis of the left thigh. This may have been due to an arthropod bite. She was started on outpatient clindamycin, but failed. She presented back to the emergency room with worsening cellulitis on 06/10/2016. She was admitted to the hospital for IV antibiotics. She was noted to have a leukocytosis, though this is decreasing. She describes her pain is 5 out of 10. She has had some drainage overnight. No fevers have been noted.  Past Medical History:  Diagnosis Date  . Anxiety   . Chronic back pain   . Depression   . Hypertension   . Migraine   . Polysubstance abuse     Past Surgical History:  Procedure Laterality Date  . CARPAL TUNNEL RELEASE    . CESAREAN SECTION N/A 09/28/2012   Procedure: Primary cesarean section with delivery of baby girl at 97. ;  Surgeon: Mora Bellman, MD;  Location: [facility];  Service: Obstetrics;  Laterality: N/A;  . TUBAL LIGATION      Family History  Problem Relation Age of Onset  . Hypertension Mother   . Diabetes Father   . Heart attack Maternal Grandmother   . Cancer Maternal Grandmother        Bladder  . Anesthesia problems Neg Hx     Social History:  reports that she has been smoking Cigarettes.  She has a 5.00 pack-year smoking history. She has never used smokeless tobacco. She reports that she does not drink alcohol or use drugs.  Allergies:  Allergies  Allergen Reactions  . Penicillins Rash    Has patient had a PCN reaction causing immediate rash, facial/tongue/throat swelling, SOB or lightheadedness with hypotension: Yes Has patient had a PCN reaction causing severe rash involving mucus membranes or skin necrosis: No Has patient had a PCN reaction that required hospitalization Yes Has patient had a PCN reaction  occurring within the last 10 years: No If all of the above answers are "NO", then may proceed with Cephalosporin use.     Medications:  Scheduled: . amphetamine-dextroamphetamine  30 mg Oral Daily  . buPROPion  150 mg Oral BID  . gabapentin  600 mg Oral TID  . nicotine  14 mg Transdermal Daily  . pantoprazole  40 mg Oral Daily  . potassium chloride  40 mEq Oral Once  . rOPINIRole  1 mg Oral QHS    Results for orders placed or performed during the hospital encounter of 06/10/16 (from the past 48 hour(s))  Basic metabolic panel     Status: Abnormal   Collection Time: 06/10/16  9:15 AM  Result Value Ref Range   Sodium 134 (L) 135 - 145 mmol/L   Potassium 3.5 3.5 - 5.1 mmol/L   Chloride 102 101 - 111 mmol/L   CO2 23 22 - 32 mmol/L   Glucose, Bld 102 (H) 65 - 99 mg/dL   BUN 10 6 - 20 mg/dL   Creatinine, Ser 0.66 0.44 - 1.00 mg/dL   Calcium 8.8 (L) 8.9 - 10.3 mg/dL   GFR calc non Af Amer >60 >60 mL/min   GFR calc Af Amer >60 >60 mL/min    Comment: (NOTE) The eGFR has been calculated using the CKD EPI equation. This calculation has not been validated in all clinical situations. eGFR's persistently <60 mL/min signify possible  Chronic Kidney Disease.    Anion gap 9 5 - 15  CBC with Differential     Status: Abnormal   Collection Time: 06/10/16  9:15 AM  Result Value Ref Range   WBC 14.8 (H) 4.0 - 10.5 K/uL   RBC 3.42 (L) 3.87 - 5.11 MIL/uL   Hemoglobin 10.7 (L) 12.0 - 15.0 g/dL   HCT 31.6 (L) 36.0 - 46.0 %   MCV 92.4 78.0 - 100.0 fL   MCH 31.3 26.0 - 34.0 pg   MCHC 33.9 30.0 - 36.0 g/dL   RDW 13.2 11.5 - 15.5 %   Platelets 240 150 - 400 K/uL   Neutrophils Relative % 83 %   Neutro Abs 12.1 (H) 1.7 - 7.7 K/uL   Lymphocytes Relative 9 %   Lymphs Abs 1.4 0.7 - 4.0 K/uL   Monocytes Relative 7 %   Monocytes Absolute 1.1 (H) 0.1 - 1.0 K/uL   Eosinophils Relative 1 %   Eosinophils Absolute 0.2 0.0 - 0.7 K/uL   Basophils Relative 0 %   Basophils Absolute 0.0 0.0 - 0.1 K/uL   Basic metabolic panel     Status: Abnormal   Collection Time: 06/11/16  5:53 AM  Result Value Ref Range   Sodium 136 135 - 145 mmol/L   Potassium 3.4 (L) 3.5 - 5.1 mmol/L   Chloride 103 101 - 111 mmol/L   CO2 25 22 - 32 mmol/L   Glucose, Bld 110 (H) 65 - 99 mg/dL   BUN 6 6 - 20 mg/dL   Creatinine, Ser 0.54 0.44 - 1.00 mg/dL   Calcium 9.0 8.9 - 10.3 mg/dL   GFR calc non Af Amer >60 >60 mL/min   GFR calc Af Amer >60 >60 mL/min    Comment: (NOTE) The eGFR has been calculated using the CKD EPI equation. This calculation has not been validated in all clinical situations. eGFR's persistently <60 mL/min signify possible Chronic Kidney Disease.    Anion gap 8 5 - 15  CBC     Status: Abnormal   Collection Time: 06/11/16  5:53 AM  Result Value Ref Range   WBC 10.9 (H) 4.0 - 10.5 K/uL   RBC 3.66 (L) 3.87 - 5.11 MIL/uL   Hemoglobin 11.4 (L) 12.0 - 15.0 g/dL   HCT 33.7 (L) 36.0 - 46.0 %   MCV 92.1 78.0 - 100.0 fL   MCH 31.1 26.0 - 34.0 pg   MCHC 33.8 30.0 - 36.0 g/dL   RDW 13.1 11.5 - 15.5 %   Platelets 234 150 - 400 K/uL    Ct Pelvis Wo Contrast  Result Date: 06/10/2016 CLINICAL DATA:  Worsening left thigh cellulitis. EXAM: CT PELVIS WITHOUT CONTRAST TECHNIQUE: Multidetector CT imaging of the pelvis was performed following the standard protocol without intravenous contrast. COMPARISON:  06/07/2016. FINDINGS: Urinary Tract:  Negative. Bowel: Visualized portions of the small bowel, appendix and colon are unremarkable. Vascular/Lymphatic: Vascular structures are unremarkable. Low aortocaval lymph node measures 10 mm, incompletely imaged. Left inguinal lymph nodes measure up to 11 mm. Reproductive: Uterus is visualized. No adnexal mass. Tubal ligation clips. Other:  Small free fluid. Musculoskeletal: Skin thickening and subcutaneous stranding extend for a length of approximately 23.5 cm along the lateral left thigh. There are developing areas of phlegmon deep to a small bulge in the overlying  skin (coronal image 59) with the largest individual collection measuring approximately 2.9 cm. Osseous structures are unremarkable. IMPRESSION: Worsening cellulitis along the left thigh with developing phlegmon/abscess just deep  to the skin surface, where there is a focal area of skin bulging, which should be amenable to direct visualization. Electronically Signed   By: Lorin Picket M.D.   On: 06/10/2016 11:49    ROS:  Pertinent items noted in HPI and remainder of comprehensive ROS otherwise negative.  Blood pressure 126/74, pulse 84, temperature 98.4 F (36.9 C), temperature source Oral, resp. rate 17, height '5\' 4"'$  (1.626 m), weight 135 lb (61.2 kg), last menstrual period 05/12/2016, SpO2 100 %. Physical Exam: Well-developed well-nourished white female in no acute distress.  Head is normocephalic, atraumatic.  Neck is supple without lymphadenopathy.  Lungs clear auscultation with equal breath sounds bilaterally.  Heart examination reveals a regular rate and rhythm without S3, S4, murmurs.  Extremity examination reveals a 3 cm raised bulla with surrounding erythema. By the nurses markings on the left thigh, the area of erythema has decreased.  CT scan report reviewed.  Assessment/Plan: Cellulitis with abscess, left thigh, improved  Plan: Will write for local wound care. Would continue IV vancomycin. Should this not improve over the next 24 hours, I will take the patient to the operating room for incision and drainage.  Marcia Hood 06/11/2016, 9:49 AM

## 2016-06-12 ENCOUNTER — Encounter (HOSPITAL_COMMUNITY): Payer: Self-pay | Admitting: *Deleted

## 2016-06-12 ENCOUNTER — Inpatient Hospital Stay (HOSPITAL_COMMUNITY): Payer: Non-veteran care | Admitting: Anesthesiology

## 2016-06-12 ENCOUNTER — Encounter (HOSPITAL_COMMUNITY)
Admission: EM | Disposition: A | Discharge status: Home / Self Care | Payer: Self-pay | Source: Home / Self Care | Attending: Family Medicine

## 2016-06-12 HISTORY — PX: INCISION AND DRAINAGE ABSCESS: SHX5864

## 2016-06-12 LAB — SURGICAL PCR SCREEN
MRSA, PCR: NEGATIVE
Staphylococcus aureus: NEGATIVE

## 2016-06-12 LAB — HIV ANTIBODY (ROUTINE TESTING W REFLEX): HIV Screen 4th Generation wRfx: NONREACTIVE

## 2016-06-12 LAB — VANCOMYCIN, TROUGH: Vancomycin Tr: 6 ug/mL — ABNORMAL LOW (ref 15–20)

## 2016-06-12 SURGERY — INCISION AND DRAINAGE, ABSCESS
Anesthesia: General | Laterality: Left

## 2016-06-12 MED ORDER — LACTATED RINGERS IV SOLN
INTRAVENOUS | Status: DC
  Administered 2016-06-12: 22:00:00 via INTRAVENOUS

## 2016-06-12 MED ORDER — SODIUM CHLORIDE 0.9 % IR SOLN
Status: DC | PRN
  Administered 2016-06-12: 1000 mL

## 2016-06-12 MED ORDER — MIDAZOLAM HCL 2 MG/2ML IJ SOLN
1.0000 mg | INTRAMUSCULAR | Status: DC | PRN
  Administered 2016-06-12: 2 mg via INTRAVENOUS

## 2016-06-12 MED ORDER — FENTANYL CITRATE (PF) 100 MCG/2ML IJ SOLN
INTRAMUSCULAR | Status: DC | PRN
  Administered 2016-06-12: 50 ug via INTRAVENOUS
  Administered 2016-06-12: 25 ug via INTRAVENOUS
  Administered 2016-06-12: 50 ug via INTRAVENOUS
  Administered 2016-06-12 (×2): 25 ug via INTRAVENOUS

## 2016-06-12 MED ORDER — FENTANYL CITRATE (PF) 100 MCG/2ML IJ SOLN
INTRAMUSCULAR | Status: AC
  Filled 2016-06-12: qty 2

## 2016-06-12 MED ORDER — KETOROLAC TROMETHAMINE 30 MG/ML IJ SOLN
INTRAMUSCULAR | Status: AC
  Filled 2016-06-12: qty 1

## 2016-06-12 MED ORDER — HYDROMORPHONE HCL 1 MG/ML IJ SOLN
1.0000 mg | INTRAMUSCULAR | Status: DC | PRN
  Administered 2016-06-12 – 2016-06-14 (×20): 1 mg via INTRAVENOUS
  Filled 2016-06-12 (×20): qty 1

## 2016-06-12 MED ORDER — PROPOFOL 10 MG/ML IV BOLUS
INTRAVENOUS | Status: AC
  Filled 2016-06-12: qty 20

## 2016-06-12 MED ORDER — LACTATED RINGERS IV SOLN
INTRAVENOUS | Status: DC
  Administered 2016-06-12: 13:00:00 via INTRAVENOUS

## 2016-06-12 MED ORDER — FENTANYL CITRATE (PF) 100 MCG/2ML IJ SOLN
25.0000 ug | INTRAMUSCULAR | Status: AC | PRN
  Administered 2016-06-12: 25 ug via INTRAVENOUS
  Administered 2016-06-12: 50 ug via INTRAVENOUS

## 2016-06-12 MED ORDER — PROPOFOL 10 MG/ML IV BOLUS
INTRAVENOUS | Status: DC | PRN
  Administered 2016-06-12: 160 mg via INTRAVENOUS

## 2016-06-12 MED ORDER — SILVER SULFADIAZINE 1 % EX CREA
TOPICAL_CREAM | CUTANEOUS | Status: DC | PRN
  Administered 2016-06-12: 1 via TOPICAL

## 2016-06-12 MED ORDER — VANCOMYCIN HCL IN DEXTROSE 750-5 MG/150ML-% IV SOLN
750.0000 mg | Freq: Three times a day (TID) | INTRAVENOUS | Status: DC
  Administered 2016-06-12 – 2016-06-14 (×7): 750 mg via INTRAVENOUS
  Filled 2016-06-12 (×10): qty 150

## 2016-06-12 MED ORDER — KETOROLAC TROMETHAMINE 30 MG/ML IJ SOLN
30.0000 mg | Freq: Once | INTRAMUSCULAR | Status: AC
  Administered 2016-06-12: 30 mg via INTRAVENOUS

## 2016-06-12 MED ORDER — MIDAZOLAM HCL 2 MG/2ML IJ SOLN
INTRAMUSCULAR | Status: AC
  Filled 2016-06-12: qty 2

## 2016-06-12 MED ORDER — HYDROMORPHONE HCL 2 MG/ML IJ SOLN
1.5000 mg | Freq: Once | INTRAMUSCULAR | Status: AC
  Administered 2016-06-12: 1.5 mg via INTRAVENOUS
  Filled 2016-06-12: qty 1

## 2016-06-12 MED ORDER — HYDROMORPHONE HCL 1 MG/ML IJ SOLN
INTRAMUSCULAR | Status: AC
  Filled 2016-06-12: qty 1

## 2016-06-12 MED ORDER — LIDOCAINE HCL (CARDIAC) 10 MG/ML IV SOLN
INTRAVENOUS | Status: DC | PRN
  Administered 2016-06-12: 40 mg via INTRAVENOUS

## 2016-06-12 MED ORDER — HYDROMORPHONE HCL 1 MG/ML IJ SOLN
0.2500 mg | INTRAMUSCULAR | Status: DC | PRN
  Administered 2016-06-12: 0.5 mg via INTRAVENOUS

## 2016-06-12 MED ORDER — SILVER SULFADIAZINE 1 % EX CREA
TOPICAL_CREAM | Freq: Two times a day (BID) | CUTANEOUS | Status: DC
  Administered 2016-06-13 – 2016-06-14 (×3): via TOPICAL
  Filled 2016-06-12: qty 400

## 2016-06-12 SURGICAL SUPPLY — 22 items
BAG HAMPER (MISCELLANEOUS) ×2 IMPLANT
CLOTH BEACON ORANGE TIMEOUT ST (SAFETY) ×2 IMPLANT
COVER LIGHT HANDLE STERIS (MISCELLANEOUS) ×4 IMPLANT
ELECT REM PT RETURN 9FT ADLT (ELECTROSURGICAL) ×2
ELECTRODE REM PT RTRN 9FT ADLT (ELECTROSURGICAL) ×1 IMPLANT
GAUZE SPONGE 4X4 12PLY STRL (GAUZE/BANDAGES/DRESSINGS) ×2 IMPLANT
GLOVE BIOGEL M 6.5 STRL (GLOVE) ×2 IMPLANT
GLOVE BIOGEL M STRL SZ7.5 (GLOVE) ×2 IMPLANT
GLOVE BIOGEL PI IND STRL 7.0 (GLOVE) ×2 IMPLANT
GLOVE BIOGEL PI INDICATOR 7.0 (GLOVE) ×2
GLOVE SURG SS PI 7.5 STRL IVOR (GLOVE) ×2 IMPLANT
GOWN STRL REUS W/TWL LRG LVL3 (GOWN DISPOSABLE) ×4 IMPLANT
KIT ROOM TURNOVER APOR (KITS) ×2 IMPLANT
MANIFOLD NEPTUNE II (INSTRUMENTS) ×2 IMPLANT
NS IRRIG 1000ML POUR BTL (IV SOLUTION) ×2 IMPLANT
PACK MINOR (CUSTOM PROCEDURE TRAY) ×2 IMPLANT
PAD ABD 5X9 TENDERSORB (GAUZE/BANDAGES/DRESSINGS) ×2 IMPLANT
PAD ARMBOARD 7.5X6 YLW CONV (MISCELLANEOUS) ×2 IMPLANT
SET BASIN LINEN APH (SET/KITS/TRAYS/PACK) ×2 IMPLANT
SWAB CULTURE LIQ STUART DBL (MISCELLANEOUS) ×2 IMPLANT
SYR BULB IRRIGATION 50ML (SYRINGE) ×2 IMPLANT
TAPE MEDIFIX FOAM 3 (GAUZE/BANDAGES/DRESSINGS) ×2 IMPLANT

## 2016-06-12 NOTE — Op Note (Signed)
Patient:  Marcia Hood  DOB:  [birth year]  MRN:  [id number]   Preop Diagnosis:  Cellulitis with abscess, left thigh  Postop Diagnosis:  Same  Procedure:  Incision and drainage, left thigh abscess  Surgeon:  Franky MachoMark Leul Narramore, M.D.  Anes:  Gen.  Indications:  Patient is a [age] year old white female who has developed a left thigh abscess. The risks and benefits of the procedure including bleeding, infection, and the possibility of needing further surgery were fully explained to the patient, who gave informed consent.  Procedure note:  The patient was placed the supine position. After general anesthesia was administered, the lateral left thigh was prepped and draped using usual sterile technique with Betadine. Surgical site confirmation was performed.  A large necrotic skin below was present with purulent drainage underneath it. This was incised and the purulent fluid noted cultured and evacuated. The subcutaneous tissue also had involvement of necrotic tissue. This was all debrided. The skin that was necrotic was also debrided over this area. The wound ultimately measured 4 x 6 cm in size. Silvadene cream was placed into the wound. A dry sterile dressing was then applied.  All tape and needle counts were correct at the end of the procedure. The patient was awakened and transferred to PACU in stable condition.  Complications:  None  EBL:  Minimal  Specimen:  Aerobic culture, left thigh abscess

## 2016-06-12 NOTE — Anesthesia Postprocedure Evaluation (Signed)
Anesthesia Post Note  Patient: Marcia ApplebaumCarolyn Hood  Procedure(s) Performed: Procedure(s) (LRB): INCISION AND DRAINAGE OF ABSCESS, LEFT THIGH (Left)  Patient location during evaluation: PACU Anesthesia Type: General Level of consciousness: awake and alert Pain management: pain level controlled Vital Signs Assessment: post-procedure vital signs reviewed and stable Respiratory status: spontaneous breathing Cardiovascular status: blood pressure returned to baseline Postop Assessment: no signs of nausea or vomiting Anesthetic complications: no     Last Vitals:  Vitals:   06/12/16 1400 06/12/16 1415  BP: 126/84 125/85  Pulse: (!) 102 95  Resp: 15 16  Temp:      Last Pain:  Vitals:   06/12/16 1358  TempSrc:   PainSc: 8                  Aidin Doane

## 2016-06-12 NOTE — Anesthesia Procedure Notes (Signed)
Procedure Name: LMA Insertion Date/Time: 06/12/2016 1:09 PM Performed by: Glynn OctaveANIEL, Marcia Hood Pre-anesthesia Checklist: Patient identified, Patient being monitored, Emergency Drugs available, Timeout performed and Suction available Patient Re-evaluated:Patient Re-evaluated prior to inductionOxygen Delivery Method: Circle System Utilized Preoxygenation: Pre-oxygenation with 100% oxygen Intubation Type: IV induction Ventilation: Mask ventilation without difficulty LMA: LMA inserted LMA Size: 4.0 Number of attempts: 1 Placement Confirmation: positive ETCO2 and breath sounds checked- equal and bilateral

## 2016-06-12 NOTE — Anesthesia Preprocedure Evaluation (Signed)
Anesthesia Evaluation  Patient identified by MRN, date of birth, ID band Patient awake    Reviewed: Allergy & Precautions, NPO status , Patient's Chart, lab work & pertinent test results  Airway Mallampati: II  TM Distance: >3 FB     Dental  (+) Teeth Intact   Pulmonary Current Smoker,    breath sounds clear to auscultation       Cardiovascular hypertension (PIH - off meds),  Rhythm:Regular Rate:Normal     Neuro/Psych  Headaches, PSYCHIATRIC DISORDERS (PTSD) Anxiety Depression    GI/Hepatic negative GI ROS, (+)     substance abuse  marijuana use,   Endo/Other    Renal/GU      Musculoskeletal   Abdominal   Peds  Hematology   Anesthesia Other Findings   Reproductive/Obstetrics                             Anesthesia Physical Anesthesia Plan  ASA: II  Anesthesia Plan: General   Post-op Pain Management:    Induction: Intravenous  Airway Management Planned: LMA  Additional Equipment:   Intra-op Plan:   Post-operative Plan: Extubation in OR  Informed Consent: I have reviewed the patients History and Physical, chart, labs and discussed the procedure including the risks, benefits and alternatives for the proposed anesthesia with the patient or authorized representative who has indicated his/her understanding and acceptance.     Plan Discussed with:   Anesthesia Plan Comments:         Anesthesia Quick Evaluation

## 2016-06-12 NOTE — Progress Notes (Signed)
Pharmacy Antibiotic Note  Marcia Hood is a 36 y.o. female admitted on 06/10/2016 with cellulitis.  Pharmacy has been consulted for Vancomycin dosing. Vancomycin trough this am is 6 mcg/ml Plan: Change vancomycin to 750 mg IV q8 hours Vancomycin trough at steady state f/u renal function, cultures and clinical course   Height: 5\' 4"  (162.6 cm) Weight: 135 lb (61.2 kg) IBW/kg (Calculated) : 54.7  Temp (24hrs), Avg:99.2 F (37.3 C), Min:98.9 F (37.2 C), Max:99.8 F (37.7 C)   Recent Labs Lab 06/07/16 1917 06/10/16 0915 06/11/16 0553 06/12/16 0819  WBC 16.6* 14.8* 10.9*  --   CREATININE 0.82 0.66 0.54  --   VANCOTROUGH  --   --   --  6*    Estimated Creatinine Clearance: 85.6 mL/min (by C-G formula based on SCr of 0.54 mg/dL).    Allergies  Allergen Reactions  . Penicillins Rash    Has patient had a PCN reaction causing immediate rash, facial/tongue/throat swelling, SOB or lightheadedness with hypotension: Yes Has patient had a PCN reaction causing severe rash involving mucus membranes or skin necrosis: No Has patient had a PCN reaction that required hospitalization Yes Has patient had a PCN reaction occurring within the last 10 years: No If all of the above answers are "NO", then may proceed with Cephalosporin use.     Antimicrobials this admission: Vancomycin 5/12  >>        Thank you for allowing pharmacy to be a part of this patient's care.  Marcia Hood, Marcia Hood 06/12/2016 9:41 AM

## 2016-06-12 NOTE — Transfer of Care (Signed)
Immediate Anesthesia Transfer of Care Note  Patient: Marcia ApplebaumCarolyn Hood  Procedure(s) Performed: Procedure(s): INCISION AND DRAINAGE OF ABSCESS, LEFT THIGH (Left)  Patient Location: PACU  Anesthesia Type:General  Level of Consciousness: awake, alert  and oriented  Airway & Oxygen Therapy: Patient Spontanous Breathing  Post-op Assessment: Report given to RN  Post vital signs: Reviewed and stable  Last Vitals:  Vitals:   06/12/16 1251 06/12/16 1300  BP: 127/84 127/80  Pulse:    Resp: (!) 22 (!) 23  Temp:      Last Pain:  Vitals:   06/12/16 1225  TempSrc: Oral  PainSc: 2       Patients Stated Pain Goal: 3 (06/12/16 1225)  Complications: No apparent anesthesia complications

## 2016-06-12 NOTE — Addendum Note (Signed)
Addendum  created 06/12/16 1445 by Moshe Salisburyaniel, Dhalia Zingaro E, CRNA   Charge Capture section accepted, Visit diagnoses modified

## 2016-06-12 NOTE — Progress Notes (Signed)
  PROGRESS NOTE  Marcia ApplebaumCarolyn Hood ZOX:[id number] DOB: 02/11/1980 DOA: 06/10/2016 PCP: Patient, No Pcp Per  Brief Narrative: 36 year old woman presented to the emergency department with increasing left thigh pain and redness. Admitted for left thigh cellulitis with developing abscess.  Assessment/Plan #1: Left thigh purulent cellulitis with developing abscess as seen on CT. failed outpatient therapy with clindamycin. MRSA PCR screen was negative. -Central area of abscess appears larger. Erythema appears unchanged. Symptomatically much worse with very poor pain control. -Continue IV antibiotics -Continue oral analgesics. Increase frequency of IV analgesic. Surgery plans incision and drainage today-  #2: Nicotine dependence, cigarettes -Stable. Continue nicotine patch  #3: Chronic back pain -Stable. Continue gabapentin.   #4: PTSD, ADHD, depression.  -Remain stable. Continue Wellbutrin, Adderall, request.     somewhat worse today. Plan for incision and drainage today.   DVT prophylaxis: SCDs Code Status: full Family Communication: none Disposition Plan: home    Marcia Sacksaniel Alesi Zachery, MD  [facility] Direct contact: [phone] --Via amion app OR  --[email or web address]; password [email or web address]  7PM-7AM contact night coverage as above 06/12/2016, 8:38 AM  LOS: 2 days   Consultants:  General surgery  Procedures:    Antimicrobials:  Vancomycin IV 5/12 >>  Interval history/Subjective: Feels much worse today. Pain much worse, pain medicine and effective.  Objective: Vitals: Afebrile. Temperature 98.9, respirations 18, pulse 86, blood pressure 122/77. SPO2 100% on room air.  Exam:     Constitutional. Appears anxious. Crying. Appears to be in significant pain. Nontoxic.  Eyes: Pupils, irises, was appear normal.  ENT: Grossly normal hearing, lips.   Cardiovascular regular rate and rhythm. No murmur, rub or gallop. No lower extremity edema.   Respiratory: Clear to auscultation  bilaterally. No wheezes, rales or rhonchi. Normal respiratory effort.   Skin: No significant change to erythema over the left lateral thigh, the bulla has increased significantly in size and is exquisitely tender to palpation. Lower leg and foot appear uninvolved.  Psychiatric: Grossly normal mood and affect. Speech is fluent and appropriate.   I have personally reviewed the following:   Labs:  PCR MRSA screen negative  Imaging studies:    Medical tests:    Test discussed with performing physician:    Decision to obtain old records:    Review and summation of old records:    Scheduled Meds: . amphetamine-dextroamphetamine  30 mg Oral Daily  . buPROPion  150 mg Oral BID  . gabapentin  600 mg Oral TID  . mupirocin cream   Topical BID  . nicotine  14 mg Transdermal Daily  . pantoprazole  40 mg Oral Daily  . rOPINIRole  1 mg Oral QHS   Continuous Infusions: . vancomycin Stopped (06/12/16 0017)    Principal Problem:   Left leg cellulitis Active Problems:   ADHD (attention deficit hyperactivity disorder)   PTSD (post-traumatic stress disorder)   Nicotine dependence   LOS: 2 days

## 2016-06-13 ENCOUNTER — Encounter (HOSPITAL_COMMUNITY): Payer: Self-pay | Admitting: General Surgery

## 2016-06-13 LAB — CBC
HCT: 31.2 % — ABNORMAL LOW (ref 36.0–46.0)
Hemoglobin: 10.7 g/dL — ABNORMAL LOW (ref 12.0–15.0)
MCH: 30.9 pg (ref 26.0–34.0)
MCHC: 34.3 g/dL (ref 30.0–36.0)
MCV: 90.2 fL (ref 78.0–100.0)
Platelets: 262 10*3/uL (ref 150–400)
RBC: 3.46 MIL/uL — ABNORMAL LOW (ref 3.87–5.11)
RDW: 12.8 % (ref 11.5–15.5)
WBC: 6.4 10*3/uL (ref 4.0–10.5)

## 2016-06-13 LAB — BASIC METABOLIC PANEL
Anion gap: 8 (ref 5–15)
BUN: 5 mg/dL — ABNORMAL LOW (ref 6–20)
CO2: 26 mmol/L (ref 22–32)
Calcium: 9 mg/dL (ref 8.9–10.3)
Chloride: 99 mmol/L — ABNORMAL LOW (ref 101–111)
Creatinine, Ser: 0.63 mg/dL (ref 0.44–1.00)
GFR calc Af Amer: >60 mL/min (ref >60)
GFR calc non Af Amer: >60 mL/min (ref >60)
Glucose, Bld: 104 mg/dL — ABNORMAL HIGH (ref 65–99)
Potassium: 3.4 mmol/L — ABNORMAL LOW (ref 3.5–5.1)
Sodium: 133 mmol/L — ABNORMAL LOW (ref 135–145)

## 2016-06-13 MED ORDER — POTASSIUM CHLORIDE CRYS ER 20 MEQ PO TBCR
40.0000 meq | EXTENDED_RELEASE_TABLET | Freq: Once | ORAL | Status: AC
  Administered 2016-06-13: 40 meq via ORAL
  Filled 2016-06-13: qty 2

## 2016-06-13 NOTE — Progress Notes (Addendum)
  PROGRESS NOTE  Marcia ApplebaumCarolyn Hood ZOX:[id number] DOB: January 10, 1981 DOA: 06/10/2016 PCP: Patient, No Pcp Per  Brief Narrative: 36 year old woman presented to the emergency department with increasing left thigh pain and redness. Admitted for left thigh cellulitis with developing abscess. Treated with empiric vancomycin, seen by general surgery and underwent incision and drainage. Now improving. Anticipate discharge home 5/16 with self wound care.  Assessment/Plan #1: Left thigh purulent cellulitis with developing abscess as seen on CT. failed outpatient therapy with clindamycin. MRSA PCR screen was negative. -Symptomatically improved status post incision and drainage. Pain well controlled. -Continue IV antibiotics today, wound care per Dr. Lovell SheehanJenkins of physical therapy  #2: Nicotine dependence, cigarettes -Remains stable. Continue nicotine patch.  #3: Chronic back pain -Remains stable. Continue gabapentin.  #4: PTSD, ADHD, depression.  -Stable. Continue Wellbutrin, Adderall, Requip.   Improving now status post incision and drainage. Anticipate discharge on 5/16 on oral antibiotics.  DVT prophylaxis: SCDs Code Status: full Family Communication: none Disposition Plan: home    Brendia Sacksaniel Goodrich, MD  [facility] Direct contact: [phone] --Via amion app OR  --[email or web address]; password [email or web address]  7PM-7AM contact night coverage as above 06/13/2016, 11:24 AM  LOS: 3 days   Consultants:  General surgery  Procedures:  5/14 Incision and drainage left thigh abscess  Antimicrobials:  Vancomycin IV 5/12 >>  Interval history/Subjective: Feels better today. Pain controlled with IV analgesic. Still having drainage from left thigh wound. Still has significant pain.  Objective: Vitals: Remains afebrile. Temperature 98.8, respirations 16, pulse 91, blood pressure 127/71. SPO2 98% on room air.  Exam:     Constitutional: Appears calm, comfortable.   Eyes: Pupils, irises, lids appear  normal  ENT: Hearing grossly normal. Lips and tongue appear normal.  Cardiovascular: Regular rate and rhythm, no murmur, rub or gallop.  Respiratory: Clear to auscultation bilaterally. No wheezes, rales or rhonchi. Normal respiratory effort.  Skin: Significant regression of erythema over the left thigh. There still edema and tenderness to palpation. The area of incision is covered.  Psychiatric: Grossly normal mood and affect. Speech fluent and appropriate.   I have personally reviewed the following:   Labs:  Basic metabolic panel unremarkable, potassium 3.4.  Leukocytosis has resolved. WBC 6.4.  Wound culture is pending.  HIV nonreactive  Imaging studies:    Medical tests:    Test discussed with performing physician:    Decision to obtain old records:    Review and summation of old records:    Scheduled Meds: . amphetamine-dextroamphetamine  30 mg Oral Daily  . buPROPion  150 mg Oral BID  . gabapentin  600 mg Oral TID  . nicotine  14 mg Transdermal Daily  . pantoprazole  40 mg Oral Daily  . rOPINIRole  1 mg Oral QHS  . silver sulfADIAZINE   Topical BID   Continuous Infusions: . vancomycin 750 mg (06/13/16 0920)    Principal Problem:   Left leg cellulitis Active Problems:   ADHD (attention deficit hyperactivity disorder)   PTSD (post-traumatic stress disorder)   Nicotine dependence   LOS: 3 days

## 2016-06-13 NOTE — Progress Notes (Signed)
PT Cancellation Note  Patient Details Name: Marcia ApplebaumCarolyn Hood MRN: [id number] DOB: May 29, 1980   Cancelled Treatment:    Reason Eval/Treat Not Completed: PT screened, no needs identified, will sign off (L thigh wound visualized with RN during dressing change.  Dressing was saturated and wound is noted to have some drainage.  Pt is very able bodied, and also lives with her mom and step dad.  Pt expressed feeling comfortable being able to perform dressing changes herself or with the assistance of her mother.  No further f/u PT is recommended at this time.  Pt educated on signs and symptoms for worsening infection and to contact her PCP at that time for possible outpatient wound care follow up.  [place]   W.W. Grainger IncBeth Amijah Timothy, PT, DPT X: [phone]

## 2016-06-13 NOTE — Anesthesia Postprocedure Evaluation (Signed)
Anesthesia Post Note  Patient: Marcia Hood  Procedure(s) Performed: Procedure(s) (LRB): INCISION AND DRAINAGE OF ABSCESS, LEFT THIGH (Left)  Patient location during evaluation: Nursing Unit Anesthesia Type: General Level of consciousness: awake and alert, oriented and patient cooperative Pain management: pain level controlled (Pain controlled with vicodin and dilaudid) Vital Signs Assessment: post-procedure vital signs reviewed and stable Respiratory status: spontaneous breathing and respiratory function stable Cardiovascular status: stable Postop Assessment: no signs of nausea or vomiting Anesthetic complications: no     Last Vitals:  Vitals:   06/12/16 2120 06/13/16 0621  BP: (!) 147/89 127/71  Pulse: 89 91  Resp: 16 16  Temp: 37.2 C 37.1 C    Last Pain:  Vitals:   06/13/16 0731  TempSrc:   PainSc: 3                  ADAMS, AMY A

## 2016-06-13 NOTE — Progress Notes (Signed)
1 Day Post-Op  Subjective: Patient with mild incisional pain. She feels much better than preop.  Objective: Vital signs in last 24 hours: Temp:  [98.1 F (36.7 C)-98.9 F (37.2 C)] 98.8 F (37.1 C) (05/15 0621) Pulse Rate:  [89-102] 91 (05/15 0621) Resp:  [15-23] 16 (05/15 0621) BP: (125-147)/(71-89) 127/71 (05/15 0621) SpO2:  [97 %-100 %] 98 % (05/15 0621) Last BM Date: 06/09/16  Intake/Output from previous day: 05/14 0701 - 05/15 0700 In: 1640.8 [P.O.:240; I.V.:1100.8; IV Piggyback:300] Out: 10 [Blood:10] Intake/Output this shift: No intake/output data recorded.  General appearance: alert, cooperative and no distress Extremities: Left thigh dressing intact.  Lab Results:   Recent Labs  06/11/16 0553 06/13/16 0550  WBC 10.9* 6.4  HGB 11.4* 10.7*  HCT 33.7* 31.2*  PLT 234 262   BMET  Recent Labs  06/11/16 0553 06/13/16 0550  NA 136 133*  K 3.4* 3.4*  CL 103 99*  CO2 25 26  GLUCOSE 110* 104*  BUN 6 5*  CREATININE 0.54 0.63  CALCIUM 9.0 9.0   PT/INR No results for input(s): LABPROT, INR in the last 72 hours.  Studies/Results: No results found.  Anti-infectives: Anti-infectives    Start     Dose/Rate Route Frequency Ordered Stop   06/12/16 1000  vancomycin (VANCOCIN) IVPB 750 mg/150 ml premix     750 mg 150 mL/hr over 60 Minutes Intravenous Every 8 hours 06/12/16 0939     06/11/16 0000  vancomycin (VANCOCIN) IVPB 1000 mg/200 mL premix  Status:  Discontinued     1,000 mg 200 mL/hr over 60 Minutes Intravenous Every 12 hours 06/10/16 1235 06/10/16 1253   06/10/16 2100  vancomycin (VANCOCIN) IVPB 1000 mg/200 mL premix  Status:  Discontinued     1,000 mg 200 mL/hr over 60 Minutes Intravenous Every 12 hours 06/10/16 1253 06/12/16 0939   06/10/16 1145  vancomycin (VANCOCIN) IVPB 1000 mg/200 mL premix     1,000 mg 200 mL/hr over 60 Minutes Intravenous  Once 06/10/16 1131 06/10/16 1325      Assessment/Plan: s/p Procedure(s): INCISION AND DRAINAGE OF  ABSCESS, LEFT THIGH Impression: Leukocytosis has resolved. We'll get physical therapy to assess patient for outpatient wound care.  LOS: 3 days    Franky MachoMark Kanan Sobek 06/13/2016

## 2016-06-14 LAB — AEROBIC CULTURE W GRAM STAIN (SUPERFICIAL SPECIMEN)

## 2016-06-14 LAB — AEROBIC CULTURE  (SUPERFICIAL SPECIMEN)

## 2016-06-14 MED ORDER — SILVER SULFADIAZINE 1 % EX CREA: TOPICAL_CREAM | Freq: Two times a day (BID) | CUTANEOUS | 0 refills | Status: DC

## 2016-06-14 MED ORDER — HYDROCODONE-ACETAMINOPHEN 5-325 MG PO TABS: 1.0000 | ORAL_TABLET | ORAL | 0 refills | Status: DC | PRN

## 2016-06-14 MED ORDER — DOXYCYCLINE HYCLATE 100 MG PO CAPS: 100.0000 mg | ORAL_CAPSULE | Freq: Two times a day (BID) | ORAL | 0 refills | Status: DC

## 2016-06-14 NOTE — Progress Notes (Signed)
Patient discharged home with IV removed and site intact. Patient sent home with all personal belongings, prescriptions, and supplies for wound dressing.

## 2016-06-14 NOTE — Discharge Summary (Signed)
Physician Discharge Summary  Chevi Lim ZOX:[id number] DOB: 20-Sep-1980 DOA: 06/10/2016  PCP: Patient, No Pcp Per  Admit date: 06/10/2016 Discharge date: 06/14/2016  Admitted From: Home Disposition:  Home  Recommendations for Outpatient Follow-up:  1. Follow up with PCP in 1-2 weeks 2. Please obtain BMP/CBC in one week 3. Follow-up with general surgery in 1 week   Home Health: Equipment/Devices:  Discharge Condition:Stable CODE STATUS:Full code Diet recommendation: Regular diet  Brief/Interim Summary: 36 year old female presented to the emergency department with increasing left thigh pain and redness. She is admitted for left thigh cellulitis and developing abscess. She was started on antibiotic therapy with vancomycin. Gen. surgery was consulted and she underwent incision and drainage on 5/14. Following drainage, the patient began to clinically improve. Overall her pain in her thigh is better. She is undergoing daily dressing changes. Culture from her wound was positive for MRSA. She's been transitioned to doxycycline. She will continue with dressing changes at home. She'll follow up with general surgery in 1 week. She is afebrile and blood work is otherwise unremarkable. She is otherwise stable for discharge today.  Discharge Diagnoses:  Principal Problem:   Left leg cellulitis Active Problems:   ADHD (attention deficit hyperactivity disorder)   PTSD (post-traumatic stress disorder)   Nicotine dependence    Discharge Instructions  Discharge Instructions    Diet - low sodium heart healthy    Complete by:  As directed    Increase activity slowly    Complete by:  As directed      Allergies as of 06/14/2016      Reactions   Penicillins Rash   Has patient had a PCN reaction causing immediate rash, facial/tongue/throat swelling, SOB or lightheadedness with hypotension: Yes Has patient had a PCN reaction causing severe rash involving mucus membranes or skin necrosis:  No Has patient had a PCN reaction that required hospitalization Yes Has patient had a PCN reaction occurring within the last 10 years: No If all of the above answers are "NO", then may proceed with Cephalosporin use.      Medication List    STOP taking these medications   clindamycin 300 MG capsule Commonly known as:  CLEOCIN   traMADol 50 MG tablet Commonly known as:  ULTRAM     TAKE these medications   amphetamine-dextroamphetamine 30 MG tablet Commonly known as:  ADDERALL Take 1 tablet by mouth daily. For ADHD   buPROPion 150 MG 12 hr tablet Commonly known as:  WELLBUTRIN SR Take 150 mg by mouth 2 (two) times daily.   doxycycline 100 MG capsule Commonly known as:  VIBRAMYCIN Take 1 capsule (100 mg total) by mouth 2 (two) times daily.   gabapentin 300 MG capsule Commonly known as:  NEURONTIN Take 2 capsules (600 mg total) by mouth 3 (three) times daily. For agitation   HYDROcodone-acetaminophen 5-325 MG tablet Commonly known as:  NORCO/VICODIN Take 1-2 tablets by mouth every 4 (four) hours as needed for moderate pain.   ibuprofen 600 MG tablet Commonly known as:  ADVIL,MOTRIN Take 1 tablet (600 mg total) by mouth 4 (four) times daily. What changed:  when to take this  reasons to take this   multivitamins ther. w/minerals Tabs tablet Take 1 tablet by mouth daily.   omeprazole 20 MG capsule Commonly known as:  PRILOSEC Take 1 capsule (20 mg total) by mouth daily. For acid reflux What changed:  how much to take  additional instructions   rOPINIRole 1 MG tablet Commonly known as:  REQUIP Take 1 tablet (1 mg total) by mouth at bedtime. For restless leg   silver sulfADIAZINE 1 % cream Commonly known as:  SILVADENE Apply topically 2 (two) times daily.   SUMAtriptan 25 MG tablet Commonly known as:  IMITREX Take 25 mg by mouth every 2 (two) hours as needed for migraine. May repeat in 2 hours if headache persists or recurs.   SUMAtriptan 6 MG/0.5ML Soln  injection Commonly known as:  IMITREX Inject 0.5 mLs (6 mg total) into the skin daily as needed (for migraine pain). May repeat in 2 hours if headache persists or recurs: For Migraine headache   zolpidem 5 MG tablet Commonly known as:  AMBIEN Take 1 tablet (5 mg total) by mouth at bedtime as needed for sleep. What changed:  how much to take      Follow-up Information    Franky Macho, MD. Schedule an appointment as soon as possible for a visit on 06/22/2016.   Specialty:  General Surgery Contact information: [address] [place] [address] [phone]          Allergies  Allergen Reactions  . Penicillins Rash    Has patient had a PCN reaction causing immediate rash, facial/tongue/throat swelling, SOB or lightheadedness with hypotension: Yes Has patient had a PCN reaction causing severe rash involving mucus membranes or skin necrosis: No Has patient had a PCN reaction that required hospitalization Yes Has patient had a PCN reaction occurring within the last 10 years: No If all of the above answers are "NO", then may proceed with Cephalosporin use.     Consultations:  Gen. surgery   Procedures/Studies: Ct Pelvis Wo Contrast  Result Date: 06/10/2016 CLINICAL DATA:  Worsening left thigh cellulitis. EXAM: CT PELVIS WITHOUT CONTRAST TECHNIQUE: Multidetector CT imaging of the pelvis was performed following the standard protocol without intravenous contrast. COMPARISON:  06/07/2016. FINDINGS: Urinary Tract:  Negative. Bowel: Visualized portions of the small bowel, appendix and colon are unremarkable. Vascular/Lymphatic: Vascular structures are unremarkable. Low aortocaval lymph node measures 10 mm, incompletely imaged. Left inguinal lymph nodes measure up to 11 mm. Reproductive: Uterus is visualized. No adnexal mass. Tubal ligation clips. Other:  Small free fluid. Musculoskeletal: Skin thickening and subcutaneous stranding extend for a length of approximately 23.5 cm  along the lateral left thigh. There are developing areas of phlegmon deep to a small bulge in the overlying skin (coronal image 59) with the largest individual collection measuring approximately 2.9 cm. Osseous structures are unremarkable. IMPRESSION: Worsening cellulitis along the left thigh with developing phlegmon/abscess just deep to the skin surface, where there is a focal area of skin bulging, which should be amenable to direct visualization. Electronically Signed   By: Leanna Battles M.D.   On: 06/10/2016 11:49   Ct Pelvis W Contrast  Result Date: 06/07/2016 CLINICAL DATA:  Insect bite to the left outer thigh on Monday. Increased swelling and pain. Fever and chills. EXAM: CT PELVIS WITH CONTRAST TECHNIQUE: Multidetector CT imaging of the pelvis was performed using the standard protocol following the bolus administration of intravenous contrast. CONTRAST:  ISOVUE-300 IOPAMIDOL (ISOVUE-300) INJECTION 61% COMPARISON:  None. FINDINGS: Urinary Tract:  No bladder wall thickening or filling defect. Bowel: Visualized portions of the small and large bowel are unremarkable. Appendix is normal. Vascular/Lymphatic: Visualized iliac, external iliac, internal iliac, and femoral arteries and veins are patent. Reproductive: Uterus and ovaries are not enlarged. Involuting cyst on the left ovary. Minimal free fluid in the pelvis is likely physiologic. Other: No free  air demonstrated within the pelvis. Pelvic wall musculature appears intact. Musculoskeletal: Pelvis, sacrum, and hips appear intact. No destructive bone lesions. There is soft tissue infiltration in the subcutaneous fat lateral to the left hip, corresponding to the marked location of the insect bite. There is associated skin thickening in this area. Changes are consistent with cellulitis. No loculated collection to suggest an abscess. No soft tissue gas is identified. No radiopaque soft tissue foreign bodies. IMPRESSION: Infiltration in the subcutaneous  fat and skin thickening lateral to the left hip corresponding to the location of an insect bite. Changes are consistent with cellulitis. No abscess or soft tissue gas identified. Electronically Signed   By: Burman NievesWilliam  Stevens M.D.   On: 06/07/2016 22:48       Subjective: Still has pain in incision wound, but overall is feeling better.  Discharge Exam: Vitals:   06/13/16 2100 06/14/16 0500  BP: (!) 143/94 130/77  Pulse: 83 88  Resp: 18 18  Temp: 98.4 F (36.9 C) 98.1 F (36.7 C)   Vitals:   06/13/16 0621 06/13/16 1400 06/13/16 2100 06/14/16 0500  BP: 127/71 123/86 (!) 143/94 130/77  Pulse: 91 84 83 88  Resp: 16 20 18 18   Temp: 98.8 F (37.1 C) 98.6 F (37 C) 98.4 F (36.9 C) 98.1 F (36.7 C)  TempSrc: Oral Oral Oral Oral  SpO2: 98% 100% 100% 100%  Weight:      Height:        General: Pt is alert, awake, not in acute distress Cardiovascular: RRR, S1/S2 +, no rubs, no gallops Respiratory: CTA bilaterally, no wheezing, no rhonchi Abdominal: Soft, NT, ND, bowel sounds + Extremities: Erythema noted around incision wound with edema and some tenderness to palpation.    The results of significant diagnostics from this hospitalization (including imaging, microbiology, ancillary and laboratory) are listed below for reference.     Microbiology: Recent Results (from the past 240 hour(s))  Surgical pcr screen     Status: None   Collection Time: 06/11/16 11:36 PM  Result Value Ref Range Status   MRSA, PCR NEGATIVE NEGATIVE Final   Staphylococcus aureus NEGATIVE NEGATIVE Final    Comment:        The Xpert SA Assay (FDA approved for NASAL specimens in patients over 34 years of age), is one component of a comprehensive surveillance program.  Test performance has been validated by [facility] for patients greater than or equal to 34 year old. It is not intended to diagnose infection nor to guide or monitor treatment.   Aerobic Culture (superficial specimen)     Status:  None   Collection Time: 06/12/16  1:39 PM  Result Value Ref Range Status   Specimen Description ABSCESS LEFT THIGH  Final   Special Requests NONE  Final   Gram Stain   Final    ABUNDANT WBC PRESENT, PREDOMINANTLY PMN MODERATE GRAM POSITIVE COCCI IN CLUSTERS Performed at [facility], [address] [place], [place] [address]    Culture   Final    ABUNDANT METHICILLIN RESISTANT STAPHYLOCOCCUS AUREUS   Report Status 06/14/2016 FINAL  Final   Organism ID, Bacteria METHICILLIN RESISTANT STAPHYLOCOCCUS AUREUS  Final      Susceptibility   Methicillin resistant staphylococcus aureus - MIC*    CIPROFLOXACIN >=8 RESISTANT Resistant     ERYTHROMYCIN >=8 RESISTANT Resistant     GENTAMICIN <=0.5 SENSITIVE Sensitive     OXACILLIN >=4 RESISTANT Resistant     TETRACYCLINE <=1 SENSITIVE Sensitive  VANCOMYCIN 1 SENSITIVE Sensitive     TRIMETH/SULFA <=10 SENSITIVE Sensitive     CLINDAMYCIN >=8 RESISTANT Resistant     RIFAMPIN <=0.5 SENSITIVE Sensitive     Inducible Clindamycin NEGATIVE Sensitive     * ABUNDANT METHICILLIN RESISTANT STAPHYLOCOCCUS AUREUS     Labs: BNP (last 3 results) No results for input(s): BNP in the last 8760 hours. Basic Metabolic Panel:  Recent Labs Lab 06/07/16 1917 06/10/16 0915 06/11/16 0553 06/13/16 0550  NA 134* 134* 136 133*  K 3.5 3.5 3.4* 3.4*  CL 103 102 103 99*  CO2 22 23 25 26   GLUCOSE 113* 102* 110* 104*  BUN 14 10 6  5*  CREATININE 0.82 0.66 0.54 0.63  CALCIUM 9.3 8.8* 9.0 9.0   Liver Function Tests: No results for input(s): AST, ALT, ALKPHOS, BILITOT, PROT, ALBUMIN in the last 168 hours. No results for input(s): LIPASE, AMYLASE in the last 168 hours. No results for input(s): AMMONIA in the last 168 hours. CBC:  Recent Labs Lab 06/07/16 1917 06/10/16 0915 06/11/16 0553 06/13/16 0550  WBC 16.6* 14.8* 10.9* 6.4  NEUTROABS 13.0* 12.1*  --   --   HGB 11.6* 10.7* 11.4* 10.7*  HCT 33.9* 31.6* 33.7* 31.2*  MCV 92.1 92.4 92.1 90.2   PLT 242 240 234 262   Cardiac Enzymes: No results for input(s): CKTOTAL, CKMB, CKMBINDEX, TROPONINI in the last 168 hours. BNP: Invalid input(s): POCBNP CBG: No results for input(s): GLUCAP in the last 168 hours. D-Dimer No results for input(s): DDIMER in the last 72 hours. Hgb A1c No results for input(s): HGBA1C in the last 72 hours. Lipid Profile No results for input(s): CHOL, HDL, LDLCALC, TRIG, CHOLHDL, LDLDIRECT in the last 72 hours. Thyroid function studies No results for input(s): TSH, T4TOTAL, T3FREE, THYROIDAB in the last 72 hours.  Invalid input(s): FREET3 Anemia work up No results for input(s): VITAMINB12, FOLATE, FERRITIN, TIBC, IRON, RETICCTPCT in the last 72 hours. Urinalysis    Component Value Date/Time   COLORURINE YELLOW 06/13/2011 2010   APPEARANCEUR CLEAR 06/13/2011 2010   LABSPEC 1.020 06/13/2011 2010   PHURINE 6.5 06/13/2011 2010   GLUCOSEU NEGATIVE 06/13/2011 2010   HGBUR TRACE (A) 06/13/2011 2010   BILIRUBINUR NEGATIVE 06/13/2011 2010   KETONESUR 40 (A) 06/13/2011 2010   PROTEINUR neg 09/27/2012 0937   PROTEINUR NEGATIVE 06/13/2011 2010   UROBILINOGEN 0.2 06/13/2011 2010   NITRITE neg 09/27/2012 0937   NITRITE NEGATIVE 06/13/2011 2010   LEUKOCYTESUR Negative 09/27/2012 0937   Sepsis Labs Invalid input(s): PROCALCITONIN,  WBC,  LACTICIDVEN Microbiology Recent Results (from the past 240 hour(s))  Surgical pcr screen     Status: None   Collection Time: 06/11/16 11:36 PM  Result Value Ref Range Status   MRSA, PCR NEGATIVE NEGATIVE Final   Staphylococcus aureus NEGATIVE NEGATIVE Final    Comment:        The Xpert SA Assay (FDA approved for NASAL specimens in patients over 45 years of age), is one component of a comprehensive surveillance program.  Test performance has been validated by [facility] for patients greater than or equal to 25 year old. It is not intended to diagnose infection nor to guide or monitor treatment.   Aerobic  Culture (superficial specimen)     Status: None   Collection Time: 06/12/16  1:39 PM  Result Value Ref Range Status   Specimen Description ABSCESS LEFT THIGH  Final   Special Requests NONE  Final   Gram Stain   Final  ABUNDANT WBC PRESENT, PREDOMINANTLY PMN MODERATE GRAM POSITIVE COCCI IN CLUSTERS Performed at [facility], [address], [place], [place] [address]    Culture   Final    ABUNDANT METHICILLIN RESISTANT STAPHYLOCOCCUS AUREUS   Report Status 06/14/2016 FINAL  Final   Organism ID, Bacteria METHICILLIN RESISTANT STAPHYLOCOCCUS AUREUS  Final      Susceptibility   Methicillin resistant staphylococcus aureus - MIC*    CIPROFLOXACIN >=8 RESISTANT Resistant     ERYTHROMYCIN >=8 RESISTANT Resistant     GENTAMICIN <=0.5 SENSITIVE Sensitive     OXACILLIN >=4 RESISTANT Resistant     TETRACYCLINE <=1 SENSITIVE Sensitive     VANCOMYCIN 1 SENSITIVE Sensitive     TRIMETH/SULFA <=10 SENSITIVE Sensitive     CLINDAMYCIN >=8 RESISTANT Resistant     RIFAMPIN <=0.5 SENSITIVE Sensitive     Inducible Clindamycin NEGATIVE Sensitive     * ABUNDANT METHICILLIN RESISTANT STAPHYLOCOCCUS AUREUS     Time coordinating discharge: Over 30 minutes  SIGNED:   Erick Blinks, MD  [facility] 06/14/2016, 1:45 PM Pager   If 7PM-7AM, please contact night-coverage [email or web address] Password [email or web address]

## 2016-06-14 NOTE — Discharge Instructions (Signed)
Clean wound with soap and water, then apply silvadene cream daily.

## 2016-06-19 ENCOUNTER — Emergency Department (HOSPITAL_COMMUNITY)
Admission: EM | Admit: 2016-06-19 | Discharge: 2016-06-19 | Disposition: A | Discharge status: Home / Self Care | Payer: Non-veteran care | Attending: Emergency Medicine | Admitting: Emergency Medicine

## 2016-06-19 ENCOUNTER — Emergency Department (HOSPITAL_COMMUNITY): Payer: Non-veteran care

## 2016-06-19 ENCOUNTER — Encounter (HOSPITAL_COMMUNITY): Payer: Self-pay | Admitting: Emergency Medicine

## 2016-06-19 DIAGNOSIS — T814XXA Infection following a procedure, initial encounter: Secondary | ICD-10-CM | POA: Insufficient documentation

## 2016-06-19 DIAGNOSIS — Y69 Unspecified misadventure during surgical and medical care: Secondary | ICD-10-CM | POA: Diagnosis not present

## 2016-06-19 DIAGNOSIS — Z5189 Encounter for other specified aftercare: Secondary | ICD-10-CM

## 2016-06-19 DIAGNOSIS — I10 Essential (primary) hypertension: Secondary | ICD-10-CM | POA: Diagnosis not present

## 2016-06-19 DIAGNOSIS — Z4801 Encounter for change or removal of surgical wound dressing: Secondary | ICD-10-CM | POA: Diagnosis present

## 2016-06-19 DIAGNOSIS — F17210 Nicotine dependence, cigarettes, uncomplicated: Secondary | ICD-10-CM | POA: Insufficient documentation

## 2016-06-19 DIAGNOSIS — Z79899 Other long term (current) drug therapy: Secondary | ICD-10-CM | POA: Insufficient documentation

## 2016-06-19 DIAGNOSIS — B999 Unspecified infectious disease: Secondary | ICD-10-CM

## 2016-06-19 LAB — URINALYSIS, ROUTINE W REFLEX MICROSCOPIC
Bacteria, UA: NONE SEEN
Bilirubin Urine: NEGATIVE
Glucose, UA: NEGATIVE mg/dL
Ketones, ur: NEGATIVE mg/dL
Leukocytes, UA: NEGATIVE
Nitrite: NEGATIVE
Protein, ur: NEGATIVE mg/dL
Specific Gravity, Urine: 1.016 (ref 1.005–1.030)
pH: 6 (ref 5.0–8.0)

## 2016-06-19 LAB — COMPREHENSIVE METABOLIC PANEL
ALT: 131 U/L — ABNORMAL HIGH (ref 14–54)
AST: 59 U/L — ABNORMAL HIGH (ref 15–41)
Albumin: 3.9 g/dL (ref 3.5–5.0)
Alkaline Phosphatase: 115 U/L (ref 38–126)
Anion gap: 9 (ref 5–15)
BUN: 20 mg/dL (ref 6–20)
CO2: 25 mmol/L (ref 22–32)
Calcium: 9.9 mg/dL (ref 8.9–10.3)
Chloride: 104 mmol/L (ref 101–111)
Creatinine, Ser: 0.8 mg/dL (ref 0.44–1.00)
GFR calc Af Amer: >60 mL/min (ref >60)
GFR calc non Af Amer: >60 mL/min (ref >60)
Glucose, Bld: 96 mg/dL (ref 65–99)
Potassium: 4.2 mmol/L (ref 3.5–5.1)
Sodium: 138 mmol/L (ref 135–145)
Total Bilirubin: 0.3 mg/dL (ref 0.3–1.2)
Total Protein: 7.9 g/dL (ref 6.5–8.1)

## 2016-06-19 LAB — CBC WITH DIFFERENTIAL/PLATELET
Basophils Absolute: 0 10*3/uL (ref 0.0–0.1)
Basophils Relative: 0 %
Eosinophils Absolute: 0.2 10*3/uL (ref 0.0–0.7)
Eosinophils Relative: 3 %
HCT: 38.3 % (ref 36.0–46.0)
Hemoglobin: 12.8 g/dL (ref 12.0–15.0)
Lymphocytes Relative: 22 %
Lymphs Abs: 1.8 10*3/uL (ref 0.7–4.0)
MCH: 30.5 pg (ref 26.0–34.0)
MCHC: 33.4 g/dL (ref 30.0–36.0)
MCV: 91.4 fL (ref 78.0–100.0)
Monocytes Absolute: 0.4 10*3/uL (ref 0.1–1.0)
Monocytes Relative: 5 %
Neutro Abs: 5.8 10*3/uL (ref 1.7–7.7)
Neutrophils Relative %: 70 %
Platelets: 367 10*3/uL (ref 150–400)
RBC: 4.19 MIL/uL (ref 3.87–5.11)
RDW: 12.7 % (ref 11.5–15.5)
WBC: 8.3 10*3/uL (ref 4.0–10.5)

## 2016-06-19 LAB — I-STAT CG4 LACTIC ACID, ED: Lactic Acid, Venous: 0.6 mmol/L (ref 0.5–1.9)

## 2016-06-19 MED ORDER — DOXYCYCLINE HYCLATE 100 MG PO CAPS: 100.0000 mg | ORAL_CAPSULE | Freq: Two times a day (BID) | ORAL | 0 refills | Status: DC

## 2016-06-19 MED ORDER — METRONIDAZOLE IN NACL 5-0.79 MG/ML-% IV SOLN
500.0000 mg | Freq: Once | INTRAVENOUS | Status: AC
  Administered 2016-06-19: 500 mg via INTRAVENOUS
  Filled 2016-06-19: qty 100

## 2016-06-19 MED ORDER — HYDROMORPHONE HCL 1 MG/ML IJ SOLN
1.0000 mg | Freq: Once | INTRAMUSCULAR | Status: AC
  Administered 2016-06-19: 1 mg via INTRAVENOUS
  Filled 2016-06-19: qty 1

## 2016-06-19 MED ORDER — SODIUM CHLORIDE 0.9 % IV BOLUS (SEPSIS)
1000.0000 mL | Freq: Once | INTRAVENOUS | Status: AC
  Administered 2016-06-19: 1000 mL via INTRAVENOUS

## 2016-06-19 MED ORDER — DEXTROSE 5 % IV SOLN
2.0000 g | Freq: Once | INTRAVENOUS | Status: AC
  Administered 2016-06-19: 2 g via INTRAVENOUS
  Filled 2016-06-19: qty 2

## 2016-06-19 MED ORDER — VANCOMYCIN HCL IN DEXTROSE 1-5 GM/200ML-% IV SOLN
1000.0000 mg | Freq: Once | INTRAVENOUS | Status: AC
  Administered 2016-06-19: 1000 mg via INTRAVENOUS
  Filled 2016-06-19: qty 200

## 2016-06-19 MED ORDER — OXYCODONE-ACETAMINOPHEN 5-325 MG PO TABS
1.0000 | ORAL_TABLET | Freq: Once | ORAL | Status: AC
  Administered 2016-06-19: 1 via ORAL
  Filled 2016-06-19: qty 1

## 2016-06-19 MED ORDER — OXYCODONE-ACETAMINOPHEN 5-325 MG PO TABS: 1.0000 | ORAL_TABLET | ORAL | 0 refills | Status: DC | PRN

## 2016-06-19 MED ORDER — ONDANSETRON HCL 4 MG/2ML IJ SOLN
4.0000 mg | Freq: Once | INTRAMUSCULAR | Status: AC
  Administered 2016-06-19: 4 mg via INTRAVENOUS
  Filled 2016-06-19: qty 2

## 2016-06-19 MED ORDER — GADOBENATE DIMEGLUMINE 529 MG/ML IV SOLN
15.0000 mL | Freq: Once | INTRAVENOUS | Status: AC | PRN
  Administered 2016-06-19: 15 mL via INTRAVENOUS

## 2016-06-19 NOTE — ED Notes (Signed)
Pt a&o x 4, vvs, RX and verbal and written discharge instructions given, verbalized understanding, pt escorted off unit via wheelchair

## 2016-06-19 NOTE — ED Provider Notes (Signed)
[facility] Provider Note   CSN: [id number] Arrival date & time: 06/19/16  1418     History   Chief Complaint Chief Complaint  Patient presents with  . Wound Check    HPI Marcia Hood is a 36 y.o. female.  She is here for evaluation of a poorly healing wound despite being treated with doxycycline, and rest.  She has severe pain both at rest and with walking.  She has nausea without vomiting.  She denies fever, chest pain, weakness or dizziness.  The pain is worst, when she goes through dressing changes.  She was recently hospitalized, and was treated with I&D with debridement for necrosis after spider bite.  She has completed her antibiotics and is out of her pain medicine.  She is concerned about the appearance of the wound, that it might indicate that it is worsening.  There are no other known modifying factors.  HPI  Past Medical History:  Diagnosis Date  . Anxiety   . Chronic back pain   . Depression   . Hypertension   . Migraine   . Polysubstance abuse     Patient Active Problem List   Diagnosis Date Noted  . Left leg cellulitis 06/10/2016  . Nicotine dependence 06/10/2016  . Severe episode of recurrent major depressive disorder, without psychotic features (HCC)   . ADHD (attention deficit hyperactivity disorder) 06/06/2015  . PTSD (post-traumatic stress disorder) 06/06/2015  . Marijuana abuse 06/06/2015  . Supervision of other normal pregnancy 09/06/2012  . Maternal chronic hypertension 08/12/2012  . High-risk pregnancy 05/16/2012  . 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 CARPAL TUNNEL RELEASE    . CESAREAN SECTION N/A 09/28/2012   Procedure: Primary cesarean section with delivery of baby girl at [id number]. ;  Surgeon: Catalina AntiguaPeggy Constant, MD;  Location: [facility];  Service: Obstetrics;  Laterality: N/A;  . INCISION AND DRAINAGE ABSCESS Left 06/12/2016   Procedure: INCISION AND DRAINAGE OF ABSCESS,  LEFT THIGH;  Surgeon: Franky MachoJenkins, Mark, MD;  Location: [facility];  Service: General;  Laterality: Left;  . TUBAL LIGATION      OB History    Gravida Para Term Preterm AB Living   3 3 3  0 0 3   SAB TAB Ectopic Multiple Live Births   0 0 0 0 3       Home Medications    Prior to Admission medications   Medication Sig Start Date End Date Taking? Authorizing Provider  amphetamine-dextroamphetamine (ADDERALL) 30 MG tablet Take 1 tablet by mouth daily. For ADHD 06/10/15  Yes Armandina StammerNwoko, Agnes I, NP  buPROPion (WELLBUTRIN SR) 150 MG 12 hr tablet Take 150 mg by mouth 2 (two) times daily.   Yes [provider]  gabapentin (NEURONTIN) 300 MG capsule Take 2 capsules (600 mg total) by mouth 3 (three) times daily. For agitation 06/10/15  Yes Armandina StammerNwoko, Agnes I, NP  ibuprofen (ADVIL,MOTRIN) 600 MG tablet Take 1 tablet (600 mg total) by mouth 4 (four) times daily. Patient taking differently: Take 600 mg by mouth 3 (three) times daily as needed for mild pain.  06/08/16  Yes Ivery QualeBryant, Hobson, PA-C  Multiple Vitamins-Minerals (MULTIVITAMINS THER. W/MINERALS) TABS tablet Take 1 tablet by mouth daily.   Yes [provider]  omeprazole (PRILOSEC) 20 MG capsule Take 1 capsule (20 mg total) by mouth daily. For acid reflux Patient taking differently: Take 40 mg by mouth daily. For acid reflux 06/10/15  Yes Sanjuana KavaNwoko, Agnes I, NP  rOPINIRole (REQUIP) 1 MG tablet Take 1 tablet (1 mg total) by mouth at bedtime. For restless leg 06/10/15  Yes Armandina Stammer I, NP  silver sulfADIAZINE (SILVADENE) 1 % cream Apply topically 2 (two) times daily. 06/14/16  Yes Erick Blinks, MD  SUMAtriptan (IMITREX) 25 MG tablet Take 25 mg by mouth every 2 (two) hours as needed for migraine. May repeat in 2 hours if headache persists or recurs.   Yes [provider]  zolpidem (AMBIEN) 5 MG tablet Take 1 tablet (5 mg total) by mouth at bedtime as needed for sleep. Patient taking differently: Take 10 mg by mouth at bedtime as needed for  sleep.  06/10/15  Yes Armandina Stammer I, NP  doxycycline (VIBRAMYCIN) 100 MG capsule Take 1 capsule (100 mg total) by mouth 2 (two) times daily. 06/19/16   Mancel Bale, MD  HYDROcodone-acetaminophen (NORCO/VICODIN) 5-325 MG tablet Take 1-2 tablets by mouth every 4 (four) hours as needed for moderate pain. Patient not taking: Reported on 06/19/2016 06/14/16   Erick Blinks, MD  oxyCODONE-acetaminophen (PERCOCET) 5-325 MG tablet Take 1 tablet by mouth every 4 (four) hours as needed for moderate pain. 06/19/16   Mancel Bale, MD  SUMAtriptan (IMITREX) 6 MG/0.5ML SOLN injection Inject 0.5 mLs (6 mg total) into the skin daily as needed (for migraine pain). May repeat in 2 hours if headache persists or recurs: For Migraine headache Patient not taking: Reported on 06/10/2016 06/10/15   Sanjuana Kava, NP    Family History Family History  Problem Relation Age of Onset  . Hypertension Mother   . Diabetes Father   . Heart attack Maternal Grandmother   . Cancer Maternal Grandmother        Bladder  . Anesthesia problems Neg Hx     Social History Social History  Substance Use Topics  . Smoking status: Current Every Day Smoker    Packs/day: 0.50    Years: 10.00    Types: Cigarettes  . Smokeless tobacco: Never Used  . Alcohol use No     Allergies   Penicillins   Review of Systems Review of Systems  All other systems reviewed and are negative.    Physical Exam Updated Vital Signs BP (!) 155/116 (BP Location: Left Arm)   Pulse 65   Temp 98.7 F (37.1 C) (Oral)   Resp 20   Ht 5\' 4"  (1.626 m)   Wt 61.2 kg (135 lb)   LMP 06/11/2016   SpO2 100%   BMI 23.17 kg/m   Physical Exam  Constitutional: She is oriented to person, place, and time. She appears well-developed and well-nourished. She appears distressed (She is uncomfortable).  HENT:  Head: Normocephalic and atraumatic.  Eyes: Conjunctivae and EOM are normal. Pupils are equal, round, and reactive to light.  Neck: Normal range  of motion and phonation normal. Neck supple.  Cardiovascular: Normal rate and regular rhythm.   Pulmonary/Chest: Effort normal and breath sounds normal. She exhibits no tenderness.  Abdominal: Soft. She exhibits no distension. There is no tenderness. There is no guarding.  Musculoskeletal:  Tender right hip, with wound, approximately 6 cm in diameter, and 3 cm deep.  Relation tissue is present in the wound.  Mild surrounding erythema but no fluctuance, drainage or bleeding.  Neurological: She is alert and oriented to person, place, and time. She exhibits normal muscle tone.  Skin: Skin is warm and dry.  Psychiatric: She has a normal mood and affect. Her behavior is normal. Judgment and thought content normal.  Nursing note and vitals reviewed.    ED Treatments / Results  Labs (all labs ordered are listed, but only abnormal results are displayed) Labs Reviewed  COMPREHENSIVE METABOLIC PANEL - Abnormal; Notable for the following:       Result Value   AST 59 (*)    ALT 131 (*)    All other components within normal limits  URINALYSIS, ROUTINE W REFLEX MICROSCOPIC - Abnormal; Notable for the following:    APPearance HAZY (*)    Hgb urine dipstick SMALL (*)    Squamous Epithelial / LPF 6-30 (*)    All other components within normal limits  CULTURE, BLOOD (ROUTINE X 2)  CULTURE, BLOOD (ROUTINE X 2)  URINE CULTURE  CBC WITH DIFFERENTIAL/PLATELET  I-STAT CG4 LACTIC ACID, ED  I-STAT CG4 LACTIC ACID, ED    EKG  EKG Interpretation None       Radiology Dg Chest Port 1 View  Result Date: 06/19/2016 CLINICAL DATA:  36 year old female post brown recluse spider bite. No chest complaints. EXAM: PORTABLE CHEST 1 VIEW COMPARISON:  None. FINDINGS: Central pulmonary vascular prominence without pulmonary edema. No infiltrate or pneumothorax. Scoliosis with slight distortion mediastinal structures. Heart size within normal limits. IMPRESSION: Central pulmonary vascular prominence without  pulmonary edema. Scoliosis thoracic spine convex right. Electronically Signed   By: Lacy Duverney M.D.   On: 06/19/2016 17:51    Procedures Procedures (including critical care time)  Medications Ordered in ED Medications  vancomycin (VANCOCIN) IVPB 1000 mg/200 mL premix (1,000 mg Intravenous New Bag/Given 06/19/16 2055)  sodium chloride 0.9 % bolus 1,000 mL (0 mLs Intravenous Stopped 06/19/16 1747)    And  sodium chloride 0.9 % bolus 1,000 mL (1,000 mLs Intravenous New Bag/Given 06/19/16 2055)  aztreonam (AZACTAM) 2 g in dextrose 5 % 50 mL IVPB (0 g Intravenous Stopped 06/19/16 1750)  metroNIDAZOLE (FLAGYL) IVPB 500 mg (0 mg Intravenous Stopped 06/19/16 2032)  gadobenate dimeglumine (MULTIHANCE) injection 15 mL (15 mLs Intravenous Contrast Given 06/19/16 1827)  ondansetron (ZOFRAN) injection 4 mg (4 mg Intravenous Given 06/19/16 2055)  HYDROmorphone (DILAUDID) injection 1 mg (1 mg Intravenous Given 06/19/16 2055)     Initial Impression / Assessment and Plan / ED Course  I have reviewed the triage vital signs and the nursing notes.  Pertinent labs & imaging results that were available during my care of the patient were reviewed by me and considered in my medical decision making (see chart for details).      Patient Vitals for the past 24 hrs:  BP Temp Temp src Pulse Resp SpO2 Height Weight  06/19/16 1940 (!) 155/116 - - 65 20 100 % - -  06/19/16 1800 - - - 60 17 100 % - -  06/19/16 1745 - - - 64 (!) 23 100 % - -  06/19/16 1700 114/63 - - 72 - 100 % - -  06/19/16 1645 - - - 72 - 99 % - -  06/19/16 1630 114/65 - - 67 - 98 % - -  06/19/16 1615 - - - 73 - 97 % - -  06/19/16 1600 112/77 - - 75 - 99 % - -  06/19/16 1435 - - - - - - 5\' 4"  (1.626 m) 61.2 kg (135 lb)  06/19/16 1434 127/74 98.7 F (37.1 C) Oral 86 19 99 % - -    9:44 PM Reevaluation with update and discussion. After initial assessment and treatment, an updated evaluation reveals patient feels better at this time.  Findings  discussed with patient and father, all questions answered. Velma Hanna L   Final Clinical Impressions(s) / ED Diagnoses   Final diagnoses:  Infection  Visit for wound check    Resolving spider bite, with reassuring evaluation.  No evidence for deep tissue collection or abscess.  She is a large open wound that will take several months to heal, and currently appears to be doing reasonably well.  Nursing Notes Reviewed/ Care Coordinated Applicable Imaging Reviewed Interpretation of Laboratory Data incorporated into ED treatment  The patient appears reasonably screened and/or stabilized for discharge and I doubt any other medical condition or other [facility] requiring further screening, evaluation, or treatment in the ED at this time prior to discharge.  Plan: Home Medications-continue usual medications; Home Treatments-rest, wound care; return here if the recommended treatment, does not improve the symptoms; Recommended follow up-surgery, in 3 days, as scheduled.   New Prescriptions New Prescriptions   DOXYCYCLINE (VIBRAMYCIN) 100 MG CAPSULE    Take 1 capsule (100 mg total) by mouth 2 (two) times daily.   OXYCODONE-ACETAMINOPHEN (PERCOCET) 5-325 MG TABLET    Take 1 tablet by mouth every 4 (four) hours as needed for moderate pain.     Mancel Bale, MD 06/19/16 2147

## 2016-06-19 NOTE — ED Triage Notes (Signed)
Patient states she had brown recluse spider bite to left thigh area and was admitted for treatment. States wound has gotten bigger and hurting worse.

## 2016-06-19 NOTE — Discharge Instructions (Signed)
The testing today was reassuring.  We gave you extra antibiotics, by IV, to improve your recovery.  Continue wound care, and try some heat on the area to improve the discomfort.  It is okay to use either wet or dry heat.  Make sure you see the surgeon, on Thursday, as scheduled.

## 2016-06-21 LAB — URINE CULTURE: Culture: NO GROWTH

## 2016-06-22 ENCOUNTER — Ambulatory Visit (INDEPENDENT_AMBULATORY_CARE_PROVIDER_SITE_OTHER): Payer: Self-pay | Admitting: General Surgery

## 2016-06-22 ENCOUNTER — Encounter: Payer: Self-pay | Admitting: General Surgery

## 2016-06-22 VITALS — BP 131/86 | HR 77 | Temp 98.6°F | Resp 18 | Ht 64.0 in | Wt 157.0 lb

## 2016-06-22 DIAGNOSIS — Z09 Encounter for follow-up examination after completed treatment for conditions other than malignant neoplasm: Secondary | ICD-10-CM

## 2016-06-22 MED ORDER — OXYCODONE-ACETAMINOPHEN 5-325 MG PO TABS: 1.0000 | ORAL_TABLET | ORAL | 0 refills | Status: DC | PRN

## 2016-06-22 NOTE — Progress Notes (Signed)
Subjective:     Marcia Hood   status post incision and debridement of left thigh wound secondary to presumed spider bite.  Patient has been given Silvadene cream.  She was seen in the emergency room earlier this week for pain control.  She did have an x-ray of her left thigh which was negative for any significant abscess.  She denies any fevers.  She does still have significant pain when undergoing dressing changes. Objective:    BP 131/86   Pulse 77   Temp 98.6 F (37 C)   Resp 18   Ht 5\' 4"  (1.626 m)   Wt 157 lb (71.2 kg)   LMP 06/11/2016   BMI 26.95 kg/m   General:  alert, cooperative and no distress    Left thigh wound with granulation tissue at the base.  Minimal purulent fluid is present.  This appears to be more soft tissue approximation and then abscess.  The wound is smaller than what was seen at the time of surgery.     Assessment:    Open left thigh wound, resolving by secondary intention.  Patient is still having pain with dressing changes.  She is finishing up her antibiotic course.    Plan:    Continue irrigating wound once a day and applying Silvadene cream.  Percocet for pain has been prescribed for dressing changes.  I will see her again in 2 weeks for follow-up.

## 2016-06-24 LAB — CULTURE, BLOOD (ROUTINE X 2)
Culture: NO GROWTH
Culture: NO GROWTH
Special Requests: ADEQUATE
Special Requests: ADEQUATE

## 2016-07-06 ENCOUNTER — Ambulatory Visit (INDEPENDENT_AMBULATORY_CARE_PROVIDER_SITE_OTHER): Payer: Self-pay | Admitting: General Surgery

## 2016-07-06 ENCOUNTER — Encounter: Payer: Self-pay | Admitting: General Surgery

## 2016-07-06 VITALS — BP 136/91 | HR 75 | Temp 98.6°F | Resp 18 | Ht 64.0 in | Wt 155.0 lb

## 2016-07-06 DIAGNOSIS — Z09 Encounter for follow-up examination after completed treatment for conditions other than malignant neoplasm: Secondary | ICD-10-CM

## 2016-07-06 MED ORDER — OXYCODONE-ACETAMINOPHEN 5-325 MG PO TABS: 1.0000 | ORAL_TABLET | Freq: Four times a day (QID) | ORAL | 0 refills | Status: DC | PRN

## 2016-07-06 NOTE — Progress Notes (Signed)
Subjective:     Marcia Hood  Status post incision and drainage of left thigh wound from a presumed spider bite. Wound is healing well by secondary intention. She still has 5 out of 10 pain, especially when she has dressing it. She is still using the Silvadene cream. Objective:    BP (!) 136/91   Pulse 75   Temp 98.6 F (37 C)   Resp 18   Ht 5\' 4"  (1.626 m)   Wt 155 lb (70.3 kg)   LMP 06/11/2016   BMI 26.61 kg/m   General:  alert, cooperative and no distress  Left thigh wound healing well by secondary intention. Is smaller than once before. No purulent drainage noted. Silver nitrate applied to the granulation tissue.     Assessment:    Doing well postoperatively.    Plan:  Continue current wound care. Percocet has been prescribed for pain, though less is needed. Follow-up in 2 weeks.

## 2016-07-20 ENCOUNTER — Encounter: Payer: Self-pay | Admitting: General Surgery

## 2016-07-20 ENCOUNTER — Ambulatory Visit (INDEPENDENT_AMBULATORY_CARE_PROVIDER_SITE_OTHER): Payer: Self-pay | Admitting: General Surgery

## 2016-07-20 VITALS — BP 130/77 | HR 81 | Temp 98.4°F | Resp 18 | Ht 64.0 in | Wt 157.0 lb

## 2016-07-20 DIAGNOSIS — Z09 Encounter for follow-up examination after completed treatment for conditions other than malignant neoplasm: Secondary | ICD-10-CM

## 2016-07-20 NOTE — Progress Notes (Signed)
Subjective:     Marcia Hood    Status post I&D of left thigh abscess.  Wound healing well. Objective:    BP 130/77   Pulse 81   Temp 98.4 F (36.9 C)   Resp 18   Ht 5\' 4"  (1.626 m)   Wt 157 lb (71.2 kg)   BMI 26.95 kg/m   General:  alert, cooperative and no distress    Left thigh wound almost completely healed by secondary intention.  Small amount of granulation tissue present and silver nitrate was applied.     Assessment:    Doing well postoperatively.    Plan:    May stop so cream.  Apply triple antibiotic ointment once a day as needed.  Follow-up as needed.

## 2019-10-14 ENCOUNTER — Other Ambulatory Visit: Payer: Self-pay | Admitting: Internal Medicine

## 2019-10-21 ENCOUNTER — Other Ambulatory Visit (HOSPITAL_COMMUNITY): Payer: Self-pay

## 2019-10-21 ENCOUNTER — Other Ambulatory Visit: Payer: Self-pay

## 2019-10-21 DIAGNOSIS — N939 Abnormal uterine and vaginal bleeding, unspecified: Secondary | ICD-10-CM

## 2019-10-28 ENCOUNTER — Ambulatory Visit (HOSPITAL_COMMUNITY)
Admission: RE | Admit: 2019-10-28 | Discharge: 2019-10-28 | Disposition: A | Discharge status: Home / Self Care | Payer: No Typology Code available for payment source | Source: Ambulatory Visit

## 2019-10-28 ENCOUNTER — Other Ambulatory Visit: Payer: Self-pay

## 2019-10-28 DIAGNOSIS — N939 Abnormal uterine and vaginal bleeding, unspecified: Secondary | ICD-10-CM | POA: Insufficient documentation

## 2019-11-18 ENCOUNTER — Ambulatory Visit (INDEPENDENT_AMBULATORY_CARE_PROVIDER_SITE_OTHER): Payer: No Typology Code available for payment source | Admitting: Gastroenterology

## 2019-11-18 ENCOUNTER — Other Ambulatory Visit: Payer: Self-pay

## 2019-11-18 ENCOUNTER — Encounter: Payer: Self-pay | Admitting: Gastroenterology

## 2019-11-18 DIAGNOSIS — R768 Other specified abnormal immunological findings in serum: Secondary | ICD-10-CM

## 2019-11-18 NOTE — Progress Notes (Signed)
Primary Care Physician:  Dr. Dorna Bloom at [facility] [place] [facility]  Primary Gastroenterologist:  Hennie Duos. Marletta Lor, DO   Chief Complaint  Patient presents with  . Hepatitis C    HPI:  Marcia Hood is a 39 y.o. female here at the request of Dr. Dorna Bloom at [facility] center for further evaluation of Hepatitis C.   Patient states she has remote history of IV drug use and has worked very hard to get her life back in order. She has been tested several times for Hep C and has never had any problems. I don't have any records of prior HCV labs. Several months ago she had a slip up and used IV drugs again but states she was alone and did not share needles. Recently LFTS noted to be elevated and HCV Ab was positive. She also has new tatoo this year but states new needles used. Reports no prior blood transfusions. Has multiple tatoos. Ex husband recently tested negative for HCV. Current partner for three years, HCV status unknown.   Patient has previously tested negative for HIV several years ago. She has completed HEP B vaccinations in the past.   She denies abdominal pain, n/v, melena, brbpr. No unintentional weight loss. No diarrhea. No change in bowels. Takes docusate sodium to keep stools soft. BM few times per week. She has heartburn. On omeprazole right now with good control but has recurrent symptoms if misses a dose.    Children are ages 97 and 74.    Reports recent liver u/s at the [place].   Current Outpatient Medications  Medication Sig Dispense Refill  . amphetamine-dextroamphetamine (ADDERALL) 30 MG tablet Take 1 tablet by mouth daily. For ADHD (Patient taking differently: 20mg  in AM and 10mg  in early afternoon)  0  . buPROPion (WELLBUTRIN SR) 150 MG 12 hr tablet Take 150 mg by mouth daily.     . ferrous sulfate 325 (65 FE) MG tablet Take 325 mg by mouth daily with breakfast.    . gabapentin (NEURONTIN) 300 MG capsule Take 2 capsules (600 mg total) by mouth 3 (three) times daily. For agitation (Patient taking  differently: Take 1,200 mg by mouth daily. ) 180 capsule 0  . omeprazole (PRILOSEC) 20 MG capsule Take 1 capsule (20 mg total) by mouth daily. For acid reflux    . rOPINIRole (REQUIP) 1 MG tablet Take 1 tablet (1 mg total) by mouth at bedtime. For restless leg    . traZODone (DESYREL) 150 MG tablet Take 150 mg by mouth at bedtime as needed for sleep.    zolpidem (AMBIEN) 5 MG tablet Take 1 tablet (5 mg total) by mouth at bedtime as needed for sleep. (Patient taking differently: Take 10 mg by mouth at bedtime as needed for sleep. ) 7 tablet 0   No current facility-administered medications for this visit.    Allergies as of 11/18/2019 - Review Complete 11/18/2019  Allergen Reaction Noted  . Penicillins Rash 05/10/2011    Past Medical History:  Diagnosis Date  . Anxiety   . Chronic back pain   . Depression   . Hypertension   . Migraine   . Polysubstance abuse (HCC)   . PTSD (post-traumatic stress disorder)     Past Surgical History:  Procedure Laterality Date  . CARPAL TUNNEL RELEASE    . CESAREAN SECTION N/A 09/28/2012   Procedure: Primary cesarean section with delivery of baby girl at 60. ;  Surgeon: 09/30/2012, MD;  Location: [facility];  Service: Obstetrics;  Laterality: N/A;  . INCISION AND DRAINAGE ABSCESS Left 06/12/2016   Procedure: INCISION AND DRAINAGE OF ABSCESS, LEFT THIGH;  Surgeon: Franky MachoJenkins, Mark, MD;  Location: [facility];  Service: General;  Laterality: Left;  . TUBAL LIGATION      Family History  Problem Relation Age of Onset  . Hypertension Mother   . Diabetes Father   . Heart attack Maternal Grandmother   . Cancer Maternal Grandmother        Bladder  . Anesthesia problems Neg Hx   . Liver disease Neg Hx     Social History   Socioeconomic History  . Marital status: Legally Separated    Spouse name: Not on file  . Number of children: Not on file  . Years of education: Not on file  . Highest education level: Not on file  Occupational History  . Not on  file  Tobacco Use  . Smoking status: Former Smoker    Packs/day: 0.50    Years: 10.00    Pack years: 5.00    Types: Cigarettes  . Smokeless tobacco: Never Used  . Tobacco comment: uses vape now  Substance and Sexual Activity  . Alcohol use: No  . Drug use: No    Types: Benzodiazepines, Cocaine, Amphetamines, Marijuana    Comment: Previously on Suboxone and is tested monthly and random  . Sexual activity: Yes    Birth control/protection: Surgical  Other Topics Concern  . Not on file  Social History Narrative  . Not on file   Social Determinants of Health   Financial Resource Strain:   . Difficulty of Paying Living Expenses: Not on file  Food Insecurity:   . Worried About Programme researcher, broadcasting/film/videounning Out of Food in the Last Year: Not on file  . Ran Out of Food in the Last Year: Not on file  Transportation Needs:   . Lack of Transportation (Medical): Not on file  . Lack of Transportation (Non-Medical): Not on file  Physical Activity:   . Days of Exercise per Week: Not on file  . Minutes of Exercise per Session: Not on file  Stress:   . Feeling of Stress : Not on file  Social Connections:   . Frequency of Communication with Friends and Family: Not on file  . Frequency of Social Gatherings with Friends and Family: Not on file  . Attends Religious Services: Not on file  . Active Member of Clubs or Organizations: Not on file  . Attends BankerClub or Organization Meetings: Not on file  . Marital Status: Not on file  Intimate Partner Violence:   . Fear of Current or Ex-Partner: Not on file  . Emotionally Abused: Not on file  . Physically Abused: Not on file  . Sexually Abused: Not on file      ROS:  General: Negative for anorexia, weight loss, fever, chills, fatigue, weakness. Eyes: Negative for vision changes.  ENT: Negative for hoarseness, difficulty swallowing , nasal congestion. CV: Negative for chest pain, angina, palpitations, dyspnea on exertion, peripheral edema.  Respiratory: Negative  for dyspnea at rest, dyspnea on exertion, cough, sputum, wheezing.  GI: See history of present illness. GU:  Negative for dysuria, hematuria, urinary incontinence, urinary frequency, nocturnal urination.  MS: Negative for joint pain, low back pain.  Derm: Negative for rash or itching.  Neuro: Negative for weakness, abnormal sensation, seizure, frequent headaches, memory loss, confusion.  Psych: Negative for   suicidal ideation, hallucinations. +depression, pstd Endo: Negative for unusual weight change.  Heme: Negative for  bruising or bleeding. Allergy: Negative for rash or hives.    Physical Examination:  BP 124/74   Pulse 79   Temp (!) 97.3 F (36.3 C) (Oral)   Ht 5\' 4"  (1.626 m)   Wt 152 lb 9.6 oz (69.2 kg)   LMP 11/08/2019   BMI 26.19 kg/m    General: Well-nourished, well-developed in no acute distress. tearful Head: Normocephalic, atraumatic.   Eyes: Conjunctiva pink, no icterus. Mouth: masked Neck: Supple without thyromegaly, masses, or lymphadenopathy.  Lungs: Clear to auscultation bilaterally.  Heart: Regular rate and rhythm, no murmurs rubs or gallops.  Abdomen: Bowel sounds are normal, nontender, nondistended, no hepatosplenomegaly or masses, no abdominal bruits or    hernia , no rebound or guarding.   Rectal: not performed Extremities: No lower extremity edema. No clubbing or deformities.  Neuro: Alert and oriented x 4 , grossly normal neurologically.  Skin: Warm and dry, no rash or jaundice.   Psych: Alert and cooperative, normal mood and affect.  Labs: September 2021: AST 55, ALT 115, total bilirubin 0.3, hemoglobin 11.1 normal, hematocrit 33.9.  Imaging Studies: October 2021 PELVIC COMPLETE WITH TRANSVAGINAL  Result Date: 10/28/2019 CLINICAL DATA:  Abnormal uterine bleeding EXAM: TRANSABDOMINAL AND TRANSVAGINAL ULTRASOUND OF PELVIS TECHNIQUE: Both transabdominal and transvaginal ultrasound examinations of the pelvis were performed. Transabdominal technique was performed  for global imaging of the pelvis including uterus, ovaries, adnexal regions, and pelvic cul-de-sac. It was necessary to proceed with endovaginal exam following the transabdominal exam to visualize the uterus endometrium ovaries. COMPARISON:  None FINDINGS: Uterus Measurements: 9.3 x 4.3 x 4.8 cm = volume: 100 mL. No fibroids or other mass visualized. Endometrium Thickness: 6.5 mm.  No focal abnormality visualized. Right ovary Measurements: 3.6 x 2.6 x 2.4 cm = volume: 12 mL. Normal appearance/no adnexal mass. Left ovary Measurements: 3.6 x 2.6 x 2.7 cm = volume: 13 mL. Normal appearance/no adnexal mass. Other findings No abnormal free fluid. IMPRESSION: Negative examination Electronically Signed   By: 10/30/2019 M.D.   On: 10/28/2019 15:36   Impression/plan:  Pleasant 39 year old female presenting for further evaluation of positive hepatitis C antibody.  Patient has prior history of IV drug use several years ago.  She believes she has been tested for hepatitis C in the past and was negative.  These results were not available.  Looking back in her labs from epic, her AST was 59/ALT 131 in May 2018.  Recently labs at the [facility] showed abnormal LFTs and HCV Ab was positive.  This year she received a new tattoo but reports clean needles were used.  She also slipped and used IV drugs several months ago but reports that she was alone and used clean needles.  Given chronically elevated AST/ALT as outlined, suspect chronic hepatitis C.  Recommend further labs including HCVRNA genotype, check hepatitis a and B immunity, rule out chronic hep B and HIV.  HCV FibroSure to determine any level of fibrosis or cirrhosis.  Obtain ultrasound which was recently done at the [facility].  May still need elastography pending results.  In preparation for possible hepatitis C treatment, I have requested that she go ahead and transition off of the omeprazole which cannot be used with many regimens.  She can use Pepcid 20 mg twice daily as  needed.  We discussed natural history, modes of transmission, potential treatment options today.  I would advise her to avoid sharing needles, toothbrushes, nail clippers, use barrier protection with sexual intercourse.  Her long-term partner should be checked  for hepatitis C.  Further recommendations to follow.

## 2019-11-18 NOTE — Patient Instructions (Signed)
1. Please have your labs and ultrasound done.  2. Try stopping omeprazole as it will interfere with Hep C medication. You can take Pepcid 20mg  twice daily to control reflux symptoms if needed. 3. Use barrier protection with sexual intercourse. Your partner should be tested for Hep C. 4. You can be reinfected with Hep C even once you are treated. Avoid sharing needles, razors, toothbrushes, nail clippers.    Hepatitis C Hepatitis C is a liver infection that is caused by the hepatitis C virus (HCV). The virus infects and causes inflammation in the liver. Hepatitis C can lead to:  A condition where the liver cannot work anymore (liver failure).  Scarring of the liver (cirrhosis).  Liver cancer. People with hepatitis C often do not know for months or years that they have it. This is because they often do not have symptoms or may have only mild symptoms. What are the causes? This condition is caused by HCV. The virus can spread from person to person (is contagious) through:  Contact with an infected person's blood, semen, or vaginal fluids.  Childbirth. A woman who has hepatitis C can pass it to her baby during birth.  Donated blood (blood transfusion) or a donated body organ (organ transplantation) if received in the [place] before 1992. What increases the risk? The following factors may make you more likely to develop this condition:  Having contact with needles or syringes that have HCV on them (are contaminated). Contact may happen while: ? Receiving acupuncture. ? Getting a tattoo. ? Getting a body piercing. ? Injecting drugs.  Having sex with someone who is infected. The virus can spread through vaginal, oral, or anal sex.  Receiving treatment to filter your blood (kidney dialysis).  Having HIV (human immunodeficiency virus) or AIDS (acquired immunodeficiency syndrome).  Having a job that involves contact with blood or certain other body fluids, such as in health  care. What are the signs or symptoms? Symptoms of this condition include:  Tiredness (fatigue).  Loss of appetite.  Nausea or vomiting.  Pain in your abdomen.  Dark yellow urine.  Your skin or the white parts of your eyes turning yellow (jaundice).  Itchy skin.  Light-colored or tan stool.  Joint pain.  Bleeding and bruising that happen often.  Fluid building up in your stomach (ascites). Often, hepatitis C causes no symptoms. How is this diagnosed? This condition is diagnosed with:  Blood tests.  Other tests of how well your liver is working. These may include: ? Magnetic resonance elastography (MRE). This imaging test uses MRI and sound waves to measure liver stiffness. ? Transient elastography. This imaging test uses ultrasound to measure liver stiffness. ? Liver biopsy. This test involves taking a tissue sample from your liver to look at under a microscope. How is this treated? Treatment may depend on how severe your condition is, how long it has lasted, and whether you have liver damage. More testing may be done to figure out the best treatment. Treatment may include:  Taking antiviral medicines and other medicines.  Having follow-up treatments every 6-12 months for infections or other liver problems.  Having liver transplantation. Follow these instructions at home: Medicines  Take over-the-counter and prescription medicines only as told by your health care provider.  If you were prescribed an antiviral medicine, take it as told by your health care provider. Do not stop using the antiviral even if you start to feel better.  Do not take any new medicines, including over-the-counter medicines  or supplements, unless your health care provider approves. Activity  Rest as needed.  Do not have sex unless approved by your health care provider.  Return to your normal activities as told by your health care provider. Ask your health care provider what activities are  safe for you.  Ask your health care provider when you may return to school or work. Eating and drinking   Eat a balanced diet with plenty of fruits and vegetables, whole grains, and lean meats or non-meat proteins (such as beans or tofu).  Drink enough fluids to keep your urine pale yellow.  Do not drink alcohol. General instructions  Do not share toothbrushes, nail clippers, or razors.  Wash your hands often with soap and water for at least 20 seconds. If soap and water are not available, use hand sanitizer.  Cover any cuts or open sores on your skin to prevent spreading HCV.  Avoid swimming or using hot tubs if you have open sores or wounds.  Keep all follow-up visits as told by your health care provider. This is important. You may need follow-up visits every 6-12 months. How is this prevented? There is no vaccine for hepatitis C. The only way to prevent this infection is to lessen your risk of coming into contact with HCV. Make sure you:  Wash your hands often with soap and water for at least 20 seconds.  Do not share needles or syringes.  Use a condom every time you have vaginal, oral, or anal sex. Be sure to use it correctly each time. ? Both females and males should wear condoms. ? Condoms should be kept in place from the beginning to the end of sexual activity. ? Latex condoms should be used, if possible. These offer the best protection.  Avoid handling blood or other body fluids without gloves or other protection.  Avoid getting tattoos or body piercings in shops or other places that are not clean. Where to find more information  Centers for Disease Control and Prevention: SaveSearches.co.nz Contact a health care provider if you:  Have a fever.  Have pain in your abdomen.  Pass dark urine.  Pass light-colored or tan stool.  Have joint pain. Get help right away if you:  Have an increase in fatigue or weakness.  Lose your appetite.  Cannot eat or  drink without vomiting.  Develop jaundice or your jaundice gets worse.  Bruise or bleed easily. Summary  Hepatitis C is a liver infection that is caused by the hepatitis C virus (HCV). This infection can lead to a condition where the liver cannot work anymore (liver failure), scarring of the liver (cirrhosis), or liver cancer.  HCV causes this condition and can spread from person to person (is contagious).  Do not take any medicines, including over-the-counter medicines or supplements, unless your health care provider approves. This information is not intended to replace advice given to you by your health care provider. Make sure you discuss any questions you have with your health care provider. Document Revised: 10/09/2018 Document Reviewed: 09/16/2018 Elsevier Patient Education  2020 [employer].

## 2019-11-21 LAB — CBC WITH DIFFERENTIAL/PLATELET
Basophils Absolute: 0 10*3/uL (ref 0.0–0.2)
Basos: 1 %
EOS (ABSOLUTE): 0.2 10*3/uL (ref 0.0–0.4)
Eos: 3 %
Hematocrit: 35.7 % (ref 34.0–46.6)
Hemoglobin: 12.2 g/dL (ref 11.1–15.9)
Immature Grans (Abs): 0 10*3/uL (ref 0.0–0.1)
Immature Granulocytes: 1 %
Lymphocytes Absolute: 2.1 10*3/uL (ref 0.7–3.1)
Lymphs: 34 %
MCH: 31 pg (ref 26.6–33.0)
MCHC: 34.2 g/dL (ref 31.5–35.7)
MCV: 91 fL (ref 79–97)
Monocytes Absolute: 0.4 10*3/uL (ref 0.1–0.9)
Monocytes: 6 %
Neutrophils Absolute: 3.3 10*3/uL (ref 1.4–7.0)
Neutrophils: 55 %
Platelets: 278 10*3/uL (ref 150–450)
RBC: 3.93 x10E6/uL (ref 3.77–5.28)
RDW: 12 % (ref 11.7–15.4)
WBC: 6 10*3/uL (ref 3.4–10.8)

## 2019-11-21 LAB — COMPREHENSIVE METABOLIC PANEL
ALT: 91 IU/L — ABNORMAL HIGH (ref 0–32)
AST: 51 IU/L — ABNORMAL HIGH (ref 0–40)
Albumin/Globulin Ratio: 1.7 (ref 1.2–2.2)
Albumin: 4.8 g/dL (ref 3.8–4.8)
Alkaline Phosphatase: 66 IU/L (ref 44–121)
BUN/Creatinine Ratio: 13 (ref 9–23)
BUN: 12 mg/dL (ref 6–20)
Bilirubin Total: 0.4 mg/dL (ref 0.0–1.2)
CO2: 22 mmol/L (ref 20–29)
Calcium: 9.9 mg/dL (ref 8.7–10.2)
Chloride: 98 mmol/L (ref 96–106)
Creatinine, Ser: 0.91 mg/dL (ref 0.57–1.00)
GFR calc Af Amer: 93 mL/min/{1.73_m2} (ref >59)
GFR calc non Af Amer: 81 mL/min/{1.73_m2} (ref >59)
Globulin, Total: 2.8 g/dL (ref 1.5–4.5)
Glucose: 97 mg/dL (ref 65–99)
Potassium: 4.2 mmol/L (ref 3.5–5.2)
Sodium: 136 mmol/L (ref 134–144)
Total Protein: 7.6 g/dL (ref 6.0–8.5)

## 2019-11-21 LAB — HCV RNA QUANT RFLX ULTRA OR GENOTYP
HCV Quant Baseline: 23500 IU/mL
HCV log10: 4.371 log10 IU/mL

## 2019-11-21 LAB — HCV FIBROSURE
ALPHA 2-MACROGLOBULINS, QN: 210 mg/dL (ref 110–276)
ALT (SGPT) P5P: 105 IU/L — ABNORMAL HIGH (ref 0–40)
Apolipoprotein A-1: 160 mg/dL (ref 116–209)
Bilirubin, Total: 0.2 mg/dL (ref 0.0–1.2)
Fibrosis Score: 0.06 (ref 0.00–0.21)
GGT: 21 IU/L (ref 0–60)
Haptoglobin: 128 mg/dL (ref 33–278)
Necroinflammat Activity Score: 0.5 — ABNORMAL HIGH (ref 0.00–0.17)

## 2019-11-21 LAB — HEPATITIS B SURFACE ANTIBODY,QUALITATIVE: Hep B Surface Ab, Qual: REACTIVE

## 2019-11-21 LAB — HEPATITIS B SURFACE ANTIGEN: Hepatitis B Surface Ag: NEGATIVE

## 2019-11-21 LAB — HEPATITIS C GENOTYPE

## 2019-11-21 LAB — HEPATITIS A ANTIBODY, TOTAL: hep A Total Ab: POSITIVE — AB

## 2019-11-21 LAB — HEPATITIS B CORE ANTIBODY, TOTAL: Hep B Core Total Ab: NEGATIVE

## 2019-11-21 LAB — HIV ANTIBODY (ROUTINE TESTING W REFLEX): HIV Screen 4th Generation wRfx: NONREACTIVE

## 2019-11-21 LAB — PROTIME-INR
INR: 1 (ref 0.9–1.2)
Prothrombin Time: 10.4 s (ref 9.1–12.0)

## 2019-11-28 ENCOUNTER — Telehealth: Payer: Self-pay

## 2019-11-28 NOTE — Telephone Encounter (Signed)
Hep C application was faxed to [employer] this morning 11/28/19 @ 9:56 AM. Waiting on an approval or denial.

## 2019-12-03 ENCOUNTER — Telehealth: Payer: Self-pay

## 2019-12-03 DIAGNOSIS — R768 Other specified abnormal immunological findings in serum: Secondary | ICD-10-CM

## 2019-12-03 NOTE — Telephone Encounter (Signed)
Epclusa 400 mg/100 mg one tab daily, #28 with 2 rfs was faxed to the [facility]. Waiting to see if medication will be approved through the [place].

## 2019-12-08 NOTE — Telephone Encounter (Signed)
Epclusa medication has been approved through the [facility] hospital. They will send pt her medication and ask that our office have pt complete labs and fax them to the [place] as pt follows up with our office. They did want to mention the Omeprazole would interact with the Epclusa and like pt to take Pepcid as needed.

## 2019-12-09 NOTE — Addendum Note (Signed)
Addended by: Tiffany Kocher on: 12/09/2019 01:44 PM   Modules accepted: Orders

## 2019-12-09 NOTE — Telephone Encounter (Signed)
I'm confused. [facility] here [facility] [place] to treat her Epclusa. I'm okay with patient having med sent directly to her but if we are managing her care I need to be in charge of instructions given to patient and labs done.   As per my OV, we have already stopped her omeprazole and given instructions for pepcid due to interference with Epclusa.  1. Patient will still need to have our [place] paper work completed and signed please.  See below. PLEASE MAKE SURE WE GO OVER THE INSTRUCTIONS WITH THE PATIENT. 2. She will need labs four weeks after she starts medication: HCV RNA quant, CMET, CBC. 3. Ov in 4 weeks.    Epclusa for Hepatitis C Instructions:   1. Take Epclusa, one tablet daily with or without food. You should take it at approximately the same time every day.  Do not miss a dose. You will need 12 weeks of treatment.    2. Do not run out of Epclusa! If you are down to one week of medication left and have not heard about your next shipment, please let us know as soon as possible.   3. If you need to start a new medication, prescription from your doctor or over the counter medication, you need to contact us to make sure it does not interfere with Epclusa. There are several medications that can interfere with Epclusa and can make you sick or make the medication not work.  4. If you need to take a medication for acid reflux, your choices are as follows: 1. TUMS or Rolaids separated at least four hours from [place].  2. Pepcid (famotidine) 20mg  up to twice per day administered with Epclusa or 12 hours apart from .   3. Do not take omeprazole.  5. Tylenol (acetominophen) and Advil (ibuprofen) are safe to take with Epclusa if needed for headache, fever, pain.   6. You will have and appointment and blood work done after 4 weeks of treatment to make sure you are tolerating Epclusa and to see if the medication is getting rid of the Hepatitis C.   7. DO NOT stop Epclusa unless instructed to by  your provider.  8. You will also have blood work done at the end of treatment and 12-24 weeks after end of treatment.        Patient signature: ____________________  Date: _______________

## 2019-12-09 NOTE — Telephone Encounter (Signed)
Noted. Pt has [facility] insurance coverage only and Dorita Fray can only be covered at cost if they provide the medication. I discussed with [facility] and pt wasn't able to get medication through them since pts insurance was only covered through the [place]. The [facility] called and is aware that pt will be seen by our office and will complete lab work/care be managed by Tana Coast, PA. The VA only wants copies of lab work once reviewed by Tana Coast, PA for their records since they are providing the medication.   Waiting on a return call from pt. Pt needs to stop by to go over instructions for medication and sign papers.

## 2019-12-09 NOTE — Telephone Encounter (Signed)
Routing to Leslie Lewis, PA.  

## 2019-12-09 NOTE — Telephone Encounter (Signed)
Noted. Thanks for clarification.

## 2019-12-10 NOTE — Telephone Encounter (Signed)
Pt stopped by and signed instruction sheet for Epclusa.

## 2019-12-10 NOTE — Telephone Encounter (Signed)
Spoke with pt. Pt was notified of instructions for starting Epclusa. Pt stated medication just arrived yesterday. Pt is aware that she needs to d/c her PPI and can use tums/rolaids or Pepcid. Instructions will be printed out for pt and pt will sign forms. Pt will be given lab orders and was made a follow up appointment.

## 2019-12-29 ENCOUNTER — Telehealth: Payer: Self-pay | Admitting: Internal Medicine

## 2019-12-29 NOTE — Telephone Encounter (Signed)
Noted. Spoke with pt. Pt is aware that she should complete labs after 4 weeks or 28 days of being on the medication.

## 2019-12-29 NOTE — Telephone Encounter (Signed)
She needs to get her labs drawn AFTER she completes four weeks or #28 pills.

## 2019-12-29 NOTE — Telephone Encounter (Signed)
Routed to Alicia.

## 2019-12-29 NOTE — Telephone Encounter (Signed)
Please call patient, she misplaced her medication and needs to speak to a nurse

## 2019-12-29 NOTE — Telephone Encounter (Signed)
Spoke with pt. Pt took her Hep C medication when it arrived for 3-4 days straight and misplaced the bottle pts medication was replaced by the [place]. Pt started the medication back after being off of it for 3-4 days. Since the new bottle of medication arrived, pt found her old bottle. Pt wants to know if she needs to get her blood drawn sooner since she missed 3-4 days of the medication?

## 2020-01-09 ENCOUNTER — Emergency Department (HOSPITAL_COMMUNITY): Payer: No Typology Code available for payment source

## 2020-01-09 ENCOUNTER — Emergency Department (HOSPITAL_COMMUNITY)
Admission: EM | Admit: 2020-01-09 | Discharge: 2020-01-09 | Disposition: A | Discharge status: Home / Self Care | Payer: No Typology Code available for payment source | Attending: Emergency Medicine | Admitting: Emergency Medicine

## 2020-01-09 ENCOUNTER — Encounter (HOSPITAL_COMMUNITY): Payer: Self-pay | Admitting: *Deleted

## 2020-01-09 DIAGNOSIS — R519 Headache, unspecified: Secondary | ICD-10-CM | POA: Diagnosis present

## 2020-01-09 DIAGNOSIS — Z87891 Personal history of nicotine dependence: Secondary | ICD-10-CM | POA: Insufficient documentation

## 2020-01-09 DIAGNOSIS — I10 Essential (primary) hypertension: Secondary | ICD-10-CM | POA: Insufficient documentation

## 2020-01-09 DIAGNOSIS — M791 Myalgia, unspecified site: Secondary | ICD-10-CM | POA: Insufficient documentation

## 2020-01-09 DIAGNOSIS — G8929 Other chronic pain: Secondary | ICD-10-CM

## 2020-01-09 LAB — BASIC METABOLIC PANEL
Anion gap: 7 (ref 5–15)
BUN: 17 mg/dL (ref 6–20)
CO2: 24 mmol/L (ref 22–32)
Calcium: 9.4 mg/dL (ref 8.9–10.3)
Chloride: 104 mmol/L (ref 98–111)
Creatinine, Ser: 0.73 mg/dL (ref 0.44–1.00)
GFR, Estimated: >60 mL/min (ref >60)
Glucose, Bld: 91 mg/dL (ref 70–99)
Potassium: 3.7 mmol/L (ref 3.5–5.1)
Sodium: 135 mmol/L (ref 135–145)

## 2020-01-09 LAB — CBC
HCT: 35.1 % — ABNORMAL LOW (ref 36.0–46.0)
Hemoglobin: 11.7 g/dL — ABNORMAL LOW (ref 12.0–15.0)
MCH: 31.6 pg (ref 26.0–34.0)
MCHC: 33.3 g/dL (ref 30.0–36.0)
MCV: 94.9 fL (ref 80.0–100.0)
Platelets: 256 10*3/uL (ref 150–400)
RBC: 3.7 MIL/uL — ABNORMAL LOW (ref 3.87–5.11)
RDW: 12.6 % (ref 11.5–15.5)
WBC: 7 10*3/uL (ref 4.0–10.5)
nRBC: 0 % (ref 0.0–0.2)

## 2020-01-09 MED ORDER — METHOCARBAMOL 500 MG PO TABS: 500.0000 mg | ORAL_TABLET | Freq: Two times a day (BID) | ORAL | 0 refills | Status: DC

## 2020-01-09 NOTE — Discharge Instructions (Addendum)
You are seen today for muscle aches and head pain.  Your work-up today was reassuring.  As we discussed I want you to take the muscle relaxants as needed, which you to follow-up with Dr. Susa Simmonds, orthopedics.  If you have any new or worsening concerning symptoms like back to the emergency department.  Please speak to your pharmacist about any new medications prescribed today in regards to side effects and interactions.  Please use the attached instructions.

## 2020-01-09 NOTE — ED Triage Notes (Signed)
Has multiple complaints for months, states she has numbness in her head, states she has had the vaccine for covid.also has back pain but has history of pinched nerve. States her entire right side hurts.

## 2020-01-09 NOTE — ED Notes (Signed)
Blood drawn via 23 gauge butterfly from L ac 

## 2020-01-09 NOTE — ED Notes (Signed)
Pt in bed pt c/o R sided head pain/numbness, no facial droop noted, no weakness noted, resps even and unlabored, pt states that her pain radiates down her neck, and she has a bump on her neck, denies increased pain when she moves her head at this time.

## 2020-01-09 NOTE — ED Provider Notes (Signed)
[facility] EMERGENCY DEPARTMENT Provider Note   CSN: [id number] Arrival date & time: 01/09/20  6203     History No chief complaint on file.   Marcia Hood is a 39 y.o. female with past medical history of anxiety, chronic back pain, depression, hypertension, migraine, polysubstance abuse that presents emergency department today for multiple complaints.  Patient states that she came here because she wants to be evaluated for her head pain mainly.  Also complaining of right-sided pain all across her body.  States that she has had right sided head pain for over a year, worsening over the past 2 months.  States it is on the right side and radiates down to her neck.  Also continues to say that she feels as if her head is numb when she feels sleeps on it.  Patient also states that she has some hip pain on the right side.  Does not radiate down into her leg.  No numbness or tingling down her leg.  States that is not present currently but can be positional sometimes.  States that she is going through a lot psychologically, has been mainly treated by the [place] multiple times for these types of pains and they have not been helping her.  States that she has tried long-acting narcotics, physical therapy and over-the-counter medications without any relief.Patient states that she wants to know if her " pain is real or if at all in her head and this is psych."  Denies any weakness, abnormal gait, new vision changes, photophobia.  When asked if patient had any trauma to her head she responds she is not sure.  States that she is having a foggy memory after receiving the Covid vaccine.  States that she has been compliant with her medications, is not currently on any opioids.  States that she has not seen a psychiatrist in multiple months.  Denies any chest pain or shortness of breath.  Denies any abdominal pain nausea, vomiting, fevers, chills, URI symptoms.  Requesting full body CT.  HPI     Past Medical History:   Diagnosis Date  . Anxiety   . Chronic back pain   . Depression   . Hypertension   . Migraine   . Polysubstance abuse (HCC)   . PTSD (post-traumatic stress disorder)     Patient Active Problem List   Diagnosis Date Noted  . Positive hepatitis C antibody test 11/18/2019  . Left leg cellulitis 06/10/2016  . Nicotine dependence 06/10/2016  . Severe episode of recurrent major depressive disorder, without psychotic features (HCC)   . ADHD (attention deficit hyperactivity disorder) 06/06/2015  . PTSD (post-traumatic stress disorder) 06/06/2015  . Marijuana abuse 06/06/2015  . Supervision of other normal pregnancy 09/06/2012  . Maternal chronic hypertension 08/12/2012  . High-risk pregnancy 05/16/2012  . 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 CARPAL TUNNEL RELEASE    . CESAREAN SECTION N/A 09/28/2012   Procedure: Primary cesarean section with delivery of baby girl at 26. ;  Surgeon: Catalina Antigua, MD;  Location: [facility];  Service: Obstetrics;  Laterality: N/A;  . INCISION AND DRAINAGE ABSCESS Left 06/12/2016   Procedure: INCISION AND DRAINAGE OF ABSCESS, LEFT THIGH;  Surgeon: Franky Macho, MD;  Location: [facility];  Service: General;  Laterality: Left;  . TUBAL LIGATION       OB History    Gravida  3   Para  3   Term  3   Preterm  0   AB  0   Living  3     SAB  0   IAB  0   Ectopic  0   Multiple  0   Live Births  3           Family History  Problem Relation Age of Onset  . Hypertension Mother   . Diabetes Father   . Heart attack Maternal Grandmother   . Cancer Maternal Grandmother        Bladder  . Anesthesia problems Neg Hx   . Liver disease Neg Hx     Social History   Tobacco Use  . Smoking status: Former Smoker    Packs/day: 0.50    Years: 10.00    Pack years: 5.00    Types: Cigarettes  . Smokeless tobacco: Never Used  . Tobacco comment: uses vape now  Substance Use Topics   . Alcohol use: No  . Drug use: No    Types: Benzodiazepines, Cocaine, Amphetamines, Marijuana    Comment: Previously on Suboxone and is tested monthly and random    Home Medications Prior to Admission medications   Medication Sig Start Date End Date Taking? Authorizing Provider  amphetamine-dextroamphetamine (ADDERALL) 30 MG tablet Take 1 tablet by mouth daily. For ADHD Patient taking differently: Take 10-20 mg by mouth See admin instructions. 20mg  in AM and 10mg  in early afternoon 06/10/15  Yes Nwoko, Nicole KindredAgnes I, NP  buPROPion (WELLBUTRIN SR) 150 MG 12 hr tablet Take 150 mg by mouth daily.    Yes [provider]  ferrous sulfate 325 (65 FE) MG tablet Take 325 mg by mouth daily with breakfast.   Yes [provider]  gabapentin (NEURONTIN) 300 MG capsule Take 2 capsules (600 mg total) by mouth 3 (three) times daily. For agitation Patient taking differently: Take 1,200 mg by mouth daily. 06/10/15  Yes Armandina StammerNwoko, Agnes I, NP  rOPINIRole (REQUIP) 1 MG tablet Take 1 tablet (1 mg total) by mouth at bedtime. For restless leg 06/10/15  Yes Armandina StammerNwoko, Agnes I, NP  zolpidem (AMBIEN) 5 MG tablet Take 1 tablet (5 mg total) by mouth at bedtime as needed for sleep. Patient taking differently: Take 10 mg by mouth at bedtime as needed for sleep. 06/10/15  Yes Armandina StammerNwoko, Agnes I, NP  methocarbamol (ROBAXIN) 500 MG tablet Take 1 tablet (500 mg total) by mouth 2 (two) times daily. 01/09/20   Farrel GordonPatel, Shakeisha Horine, PA-C  traZODone (DESYREL) 150 MG tablet Take 150 mg by mouth at bedtime as needed for sleep.    [provider]    Allergies    Penicillins  Review of Systems   Review of Systems  Constitutional: Negative for chills, diaphoresis, fatigue and fever.  HENT: Negative for congestion, sore throat and trouble swallowing.   Eyes: Negative for pain and visual disturbance.  Respiratory: Negative for cough, shortness of breath and wheezing.   Cardiovascular: Negative for chest pain, palpitations and  leg swelling.  Gastrointestinal: Negative for abdominal distention, abdominal pain, diarrhea, nausea and vomiting.  Genitourinary: Negative for difficulty urinating.  Musculoskeletal: Positive for arthralgias. Negative for back pain, neck pain and neck stiffness.  Skin: Negative for pallor.  Neurological: Positive for numbness and headaches. Negative for dizziness, seizures, syncope, facial asymmetry, speech difficulty, weakness and light-headedness.  Psychiatric/Behavioral: Negative for confusion.    Physical Exam Updated Vital Signs BP (!) 141/86 (BP Location: Right Arm)   Pulse 71   Temp 97.8 F (36.6 C) (Oral)   Resp 18  Ht 5\' 4"  (1.626 m)   Wt 63.5 kg   LMP 01/09/2020   SpO2 99%   BMI 24.03 kg/m   Physical Exam Constitutional:      General: She is not in acute distress.    Appearance: Normal appearance. She is not ill-appearing, toxic-appearing or diaphoretic.  HENT:     Head: Normocephalic and atraumatic.     Comments: No pain when palpated    Mouth/Throat:     Mouth: Mucous membranes are moist.     Pharynx: Oropharynx is clear.  Eyes:     General: No scleral icterus.    Extraocular Movements: Extraocular movements intact.     Pupils: Pupils are equal, round, and reactive to light.  Cardiovascular:     Rate and Rhythm: Normal rate and regular rhythm.     Pulses: Normal pulses.     Heart sounds: Normal heart sounds.  Pulmonary:     Effort: Pulmonary effort is normal. No respiratory distress.     Breath sounds: Normal breath sounds. No stridor. No wheezing, rhonchi or rales.  Chest:     Chest wall: No tenderness.  Abdominal:     General: Abdomen is flat. There is no distension.     Palpations: Abdomen is soft.     Tenderness: There is no abdominal tenderness. There is no guarding or rebound.  Musculoskeletal:        General: No swelling or tenderness. Normal range of motion.     Cervical back: Normal range of motion and neck supple. No rigidity.     Right  lower leg: No edema.     Left lower leg: No edema.     Comments: No tenderness to palpation of hip, no tenderness to palpation of back.  Normal range of motion to all extremities.  Skin:    General: Skin is warm and dry.     Capillary Refill: Capillary refill takes less than 2 seconds.     Coloration: Skin is not pale.  Neurological:     General: No focal deficit present.     Mental Status: She is alert and oriented to person, place, and time.     Comments: Alert. Clear speech. No facial droop. CNIII-XII grossly intact. Bilateral upper and lower extremities' sensation grossly intact. 5/5 symmetric strength with grip strength and with plantar and dorsi flexion bilaterally drift. Negative Romberg sign. Gait is steady and intact   Psychiatric:        Mood and Affect: Mood normal.        Behavior: Behavior normal.     ED Results / Procedures / Treatments   Labs (all labs ordered are listed, but only abnormal results are displayed) Labs Reviewed  CBC - Abnormal; Notable for the following components:      Result Value   RBC 3.70 (*)    Hemoglobin 11.7 (*)    HCT 35.1 (*)    All other components within normal limits  BASIC METABOLIC PANEL    EKG None  Radiology CT Head Wo Contrast  Result Date: 01/09/2020 CLINICAL DATA:  Headache. EXAM: CT HEAD WITHOUT CONTRAST TECHNIQUE: Contiguous axial images were obtained from the base of the skull through the vertex without intravenous contrast. COMPARISON:  None. FINDINGS: Brain: The ventricles are normal in size and configuration. No extra-axial fluid collections are identified. The gray-white differentiation is maintained. No CT findings for acute hemispheric infarction or intracranial hemorrhage. No mass lesions. The brainstem and cerebellum are normal. Vascular: No hyperdense vessels or  obvious aneurysm. Skull: No acute skull fracture. No bone lesion. Sinuses/Orbits: The paranasal sinuses and mastoid air cells are clear. The globes are  intact. Other: No scalp lesions, laceration or hematoma. IMPRESSION: Normal head CT. Electronically Signed   By: Rudie Meyer M.D.   On: 01/09/2020 12:26    Procedures Procedures (including critical care time)  Medications Ordered in ED Medications - No data to display  ED Course  I have reviewed the triage vital signs and the nursing notes.  Pertinent labs & imaging results that were available during my care of the patient were reviewed by me and considered in my medical decision making (see chart for details).    MDM Rules/Calculators/A&P                          Caedyn Tassinari is a 39 y.o. female with past medical history of anxiety, chronic back pain, depression, hypertension, migraine, polysubstance abuse that presents emergency department today for head pain, patient requesting head CT at this time.  Did discuss risks of this, however patient adamant about head CT.  Will order this at this time since headache is worsening and questionably positional with questionable head trauma.  Patient with normal neuro exam.  Normal vitals.  Does not appear in acute distress.    Work-up today normal BMP, CBC unremarkable.  CT head without any abnormalities.  Repeat neuro exam normal.  Patient to be discharged at this time, symptomatic treatment discussed including muscle relaxant.  Patient is agreeable with plan, will follow up with Orthopedic doctor.  High suspicion for psychiatric component and questionable fibromyalgia at this time.  Patient to be discharged with strict return precautions.  Did discuss need for psychiatric therapy, patient agreeable.  Doubt need for further emergent work up at this time. I explained the diagnosis and have given explicit precautions to return to the ER including for any other new or worsening symptoms. The patient understands and accepts the medical plan as it's been dictated and I have answered their questions. Discharge instructions concerning home care and  prescriptions have been given. The patient is STABLE and is discharged to home in good condition.   Final Clinical Impression(s) / ED Diagnoses Final diagnoses:  Chronic nonintractable headache, unspecified headache type  Myalgia    Rx / DC Orders ED Discharge Orders         Ordered    methocarbamol (ROBAXIN) 500 MG tablet  2 times daily        01/09/20 1341           Farrel Gordon, PA-C 01/09/20 1347    Vanetta Mulders, MD 02/07/20 1909

## 2020-01-12 ENCOUNTER — Encounter: Payer: Self-pay | Admitting: Internal Medicine

## 2020-01-12 ENCOUNTER — Ambulatory Visit: Payer: No Typology Code available for payment source | Admitting: Gastroenterology

## 2020-01-14 ENCOUNTER — Telehealth: Payer: Self-pay | Admitting: Gastroenterology

## 2020-01-14 NOTE — Telephone Encounter (Signed)
Spoke with pt. Pt stated that she was 4- 8 weeks in. That is a big gap to know how long pt has been on medication. Pt states she didn't come to her apt because she wanted to complete labs first. Pt was walking in the lab when I called her this morning. Apt was scheduled for first available with Tana Coast, PA on 02/25/20. Please advise if this is ok?

## 2020-01-14 NOTE — Telephone Encounter (Signed)
Patient no showed for her appointment this week.   Please reach out to patient and find out how far into her HCV treatment she is not. Remind her to have labs done after she completes first 28 days.  She needs to reschedule OV.

## 2020-01-15 LAB — COMPREHENSIVE METABOLIC PANEL
ALT: 17 IU/L (ref 0–32)
AST: 18 IU/L (ref 0–40)
Albumin/Globulin Ratio: 1.7 (ref 1.2–2.2)
Albumin: 4.6 g/dL (ref 3.8–4.8)
Alkaline Phosphatase: 55 IU/L (ref 44–121)
BUN/Creatinine Ratio: 15 (ref 9–23)
BUN: 14 mg/dL (ref 6–20)
Bilirubin Total: 0.3 mg/dL (ref 0.0–1.2)
CO2: 22 mmol/L (ref 20–29)
Calcium: 9.8 mg/dL (ref 8.7–10.2)
Chloride: 99 mmol/L (ref 96–106)
Creatinine, Ser: 0.95 mg/dL (ref 0.57–1.00)
GFR calc Af Amer: 88 mL/min/{1.73_m2} (ref >59)
GFR calc non Af Amer: 77 mL/min/{1.73_m2} (ref >59)
Globulin, Total: 2.7 g/dL (ref 1.5–4.5)
Glucose: 92 mg/dL (ref 65–99)
Potassium: 4.6 mmol/L (ref 3.5–5.2)
Sodium: 137 mmol/L (ref 134–144)
Total Protein: 7.3 g/dL (ref 6.0–8.5)

## 2020-01-15 LAB — CBC WITH DIFFERENTIAL/PLATELET
Basophils Absolute: 0.1 10*3/uL (ref 0.0–0.2)
Basos: 1 %
EOS (ABSOLUTE): 0.2 10*3/uL (ref 0.0–0.4)
Eos: 3 %
Hematocrit: 36.4 % (ref 34.0–46.6)
Hemoglobin: 12.2 g/dL (ref 11.1–15.9)
Immature Grans (Abs): 0 10*3/uL (ref 0.0–0.1)
Immature Granulocytes: 0 %
Lymphocytes Absolute: 3.2 10*3/uL — ABNORMAL HIGH (ref 0.7–3.1)
Lymphs: 42 %
MCH: 30.9 pg (ref 26.6–33.0)
MCHC: 33.5 g/dL (ref 31.5–35.7)
MCV: 92 fL (ref 79–97)
Monocytes Absolute: 0.5 10*3/uL (ref 0.1–0.9)
Monocytes: 7 %
Neutrophils Absolute: 3.6 10*3/uL (ref 1.4–7.0)
Neutrophils: 47 %
Platelets: 314 10*3/uL (ref 150–450)
RBC: 3.95 x10E6/uL (ref 3.77–5.28)
RDW: 12.3 % (ref 11.7–15.4)
WBC: 7.7 10*3/uL (ref 3.4–10.8)

## 2020-01-15 LAB — HCV RNA QUANT: Hepatitis C Quantitation: NOT DETECTED IU/mL

## 2020-01-21 NOTE — Telephone Encounter (Signed)
Tried calling pt. VM is full. Will call pt back.  

## 2020-01-21 NOTE — Telephone Encounter (Signed)
Please let pt know that her labs look great.   Her LFTs are now normal. No anemia. Normal kidney function. Hep C not detected.   1. It is VERY important that she continue her Epclusa for TOTAL of 12 weeks. Do NOT skip any doses and do not stop before she takes 12 weeks worth. 2. If she does not complete medication as prescribed her Hep C can come back.  3. She will need to keep appt in January as scheduled.  4. We will plan to repeat her HCV labs again 3 months after she completes Epclusa to document sustained virological response.

## 2020-01-28 NOTE — Telephone Encounter (Signed)
Spoke with pt. Pt was notified of results and Tana Coast, PA recommendations. Pt will follow up 01/2020 as directed, apt has been scheduled. Pt is aware that labs are needed 3 months after treatment.

## 2020-02-05 ENCOUNTER — Other Ambulatory Visit: Payer: Self-pay

## 2020-02-05 ENCOUNTER — Encounter: Payer: Self-pay | Admitting: Nurse Practitioner

## 2020-02-05 ENCOUNTER — Ambulatory Visit (INDEPENDENT_AMBULATORY_CARE_PROVIDER_SITE_OTHER): Payer: No Typology Code available for payment source | Admitting: Nurse Practitioner

## 2020-02-05 VITALS — BP 140/88 | HR 91 | Temp 97.3°F | Ht 64.0 in | Wt 134.8 lb

## 2020-02-05 DIAGNOSIS — R202 Paresthesia of skin: Secondary | ICD-10-CM

## 2020-02-05 DIAGNOSIS — F32A Depression, unspecified: Secondary | ICD-10-CM

## 2020-02-05 DIAGNOSIS — R413 Other amnesia: Secondary | ICD-10-CM

## 2020-02-05 DIAGNOSIS — B182 Chronic viral hepatitis C: Secondary | ICD-10-CM

## 2020-02-05 DIAGNOSIS — F419 Anxiety disorder, unspecified: Secondary | ICD-10-CM

## 2020-02-05 DIAGNOSIS — F4310 Post-traumatic stress disorder, unspecified: Secondary | ICD-10-CM | POA: Diagnosis not present

## 2020-02-05 NOTE — Patient Instructions (Signed)
Your health issues we discussed today were:   Hepatitis C currently undergoing treatment with Epclusa: 1. As we discussed, your labs that were completed last month looked great! 2. The definition of "cure" is no virus detected 3 months after you complete your medication 3. Based on my best estimate you should be completing your medicine around 03/16/2020 4. I will speak with the manufacture representative about your symptoms to see if these are known side effects 5. In the meantime, I encourage you to reach out to the [facility] and your primary care/psychiatric team to discuss your symptoms, notify them that she never received your Wellbutrin, and try to get back on medication or other recommendations they may have 6. Call us if you have any worsening symptoms or problems 7. You have been doing great!  Keep up the good work and I am confident this will work for you  Overall I recommend:  1. Continue your other current medications 2. Call us before starting any new medications, over-the-counter products, herbs, supplements, etc. 3. I will have you follow-up around February 15 when you are due to complete your medicine so we can update labs and I can check on your symptoms 4. Call us if you have any questions or concerns   ---------------------------------------------------------------  At [facility] we value your feedback. You may receive a survey about your visit today. Please share your experience as we strive to create trusting relationships with our patients to provide genuine, compassionate, quality care.  We appreciate your understanding and patience as we review any laboratory studies, imaging, and other diagnostic tests that are ordered as we care for you. Our office policy is 5 business days for review of these results, and any emergent or urgent results are addressed in a timely manner for your best interest. If you do not hear from our office in 1 week, please  contact us.   We also encourage the use of MyChart, which contains your medical information for your review as well. If you are not enrolled in this feature, an access code is on this after visit summary for your convenience. Thank you for allowing [place] to be involved in your care.  It was great to see you today!  I hope you have a Happy New Year!!    ---------------------------------------------------------------

## 2020-02-05 NOTE — Progress Notes (Signed)
Referring Provider: [facility] Primary Care Physician:  [facility] Primary GI:  Dr. Marletta Lor  Chief Complaint  Patient presents with  . Hepatitis C    F/u. C/o being disoriented at night when driving. Still on epclusa. Noticed this disorientation started ? When started epclusa. Tingling sensation in head feeling    HPI:   Marcia Hood is a 40 y.o. female who presents for follow-up.  The patient was last seen in our office 11/18/2019 for positive hepatitis C antibody.  At that time noted history of remote IV drug use but has "worked hard to get cleaned up".  Several months ago she had a slip up and used IV drugs again but was alone does not share needles.  Recent bump in LFTs with positive hepatitis C antibody.  Also a new tattoo but states new needles were used.  Does note multiple previous tattoos.  Ex-husband recently tested negative for hepatitis C.  Current partner for 3 years with unknown HCV status.  Previous completion of hep B vaccinations.  Denies any overt GI or hepatic symptoms.  Recent liver ultrasound at the [place], per the patient.  Recommended work-up labs and ultrasound, stop omeprazole while taking hepatitis C medication, barrier protection with intercourse, partner should be tested for hepatitis C.  Labs completed 11/18/2019 including CMP with mild transaminitis with AST/ALT at 51/91, other LFTs and kidney function normal.  CBC normal (platelet count 278).  INR normal.  Hepatitis A antibody positive.  Hepatitis B serologies found negative core antibody and surface antigen but positive surface antibody consistent with immunity from vaccination.  HIV negative.  Hepatitis C infection confirmed with RNA at 23,500 copies, genotype 1a.  Fiber sure consistent with no fibrosis.  After work-up for hepatitis C and application was sent to [facility] specialty pharmacy for coverage of Epclusa to treat hepatitis C.  She called our office 12/29/2019 indicating she  took her medication for 3 to 4 days and then misplaced the bottle.  This has been replaced by the [place] but was off the medication for 3 to 4 days.  Subsequently she found her old bottle.  Recommend continue to plan for updated labs after 4 weeks of treatment.  The patient had a follow-up visit 01/14/2020 but she was a no-show.  She did update her labs which found LFTs now normal, no anemia, normal kidney function, hepatitis C RNA not detected.  Stressed the importance of completing a total of 12 weeks of medication and to not skip any more doses, keep appointment in January as scheduled, repeat hepatitis C labs at 3 months after completion to document SVR.  Based on chart review, appears patient started Epclusa around 12/15/19, plans for 12 weeks of treatment (estimated completion 03/16/20)  She did have an ED visit 01/09/20 for headache and the ED provider questioned psychiatric component at that time. Also reviewed CareEverywhere and noted ED visit awake worsening versus [facility] for the patient presented for abdominal pain.  At some point she stated she was found "behind in impound because I had a spiritual awakening and wanted to see the view."  Today she states she doing okay overall. Patient is accompanied by her father.  There is concerns for confusion, memory lapses, unusual behavior. Today she states she has been having some disorientation when trying to drive at night, significant weight loss (subjectively 15-20 lbs in the past 6 months). Objectively she is down about 16 lbs in about 3 months (since  last OV). States she was called by the [place] and told she is "sick, there's some abnormalities in your bloodwork" which was Hep C. States she's had significant workup with this. Also having a lot of confusion, memory loss. Feels like the right side of her head is numb/tingling, warm. Also feels like there's dents in her skull like it is sinking. She states these symptoms started when she was  diagnosed with Hep C. Her father states it's like a switch was flipped. She expresses a lot of anxiety about even accidentally bleeding around her children in case she has a cut, she doesn't want them to get infected. Denies any recent drug use, no alcohol. Denies abdominal pain, N/V, hematochezia, melena, fever, chills. Has had some irritability (which she relates to frustration with confusion); she is quick to anger currently. Some intermittent sleep difficulty. She will occasionally have racing heart and diaphoresis. She has other friends going through Hep C and they're having similar sleep difficulties; she thinks sleep issues are because of anxiety.  She stopped taking Wellbutrin a couple weeks ago. States she tried to order more, but it hasn't been delivered.  Past Medical History:  Diagnosis Date  . Anxiety   . Chronic back pain   . Depression   . Hypertension   . Migraine   . Polysubstance abuse (HCC)   . PTSD (post-traumatic stress disorder)     Past Surgical History:  Procedure Laterality Date  . CARPAL TUNNEL RELEASE    . CESAREAN SECTION N/A 09/28/2012   Procedure: Primary cesarean section with delivery of baby girl at 24. ;  Surgeon: Catalina Antigua, MD;  Location: [facility];  Service: Obstetrics;  Laterality: N/A;  . INCISION AND DRAINAGE ABSCESS Left 06/12/2016   Procedure: INCISION AND DRAINAGE OF ABSCESS, LEFT THIGH;  Surgeon: Franky Macho, MD;  Location: [facility];  Service: General;  Laterality: Left;  . TUBAL LIGATION      Current Outpatient Medications  Medication Sig Dispense Refill  . amphetamine-dextroamphetamine (ADDERALL) 30 MG tablet Take 1 tablet by mouth daily. For ADHD (Patient taking differently: Take 10-20 mg by mouth See admin instructions. 20mg  in AM and 10mg  in early afternoon when she remembers. At times will skip doses)  0  . gabapentin (NEURONTIN) 300 MG capsule Take 2 capsules (600 mg total) by mouth 3 (three) times daily. For agitation (Patient taking  differently: Take 1,200 mg by mouth daily. 3-4 at bedtime as needed) 180 capsule 0  . rOPINIRole (REQUIP) 1 MG tablet Take 1 tablet (1 mg total) by mouth at bedtime. For restless leg    . Sofosbuvir-Velpatasvir (EPCLUSA PO) Take by mouth. Once a day    . traZODone (DESYREL) 150 MG tablet Take 150 mg by mouth at bedtime as needed for sleep.    zolpidem (AMBIEN) 5 MG tablet Take 1 tablet (5 mg total) by mouth at bedtime as needed for sleep. (Patient taking differently: Take 10 mg by mouth at bedtime as needed for sleep.) 7 tablet 0   No current facility-administered medications for this visit.    Allergies as of 02/05/2020 - Review Complete 02/05/2020  Allergen Reaction Noted  . Penicillins Rash 05/10/2011    Family History  Problem Relation Age of Onset  . Hypertension Mother   . Diabetes Father   . Heart attack Maternal Grandmother   . Cancer Maternal Grandmother        Bladder  . Anesthesia problems Neg Hx   . Liver disease Neg Hx  Social History   Socioeconomic History  . Marital status: Legally Separated    Spouse name: Not on file  . Number of children: Not on file  . Years of education: Not on file  . Highest education level: Not on file  Occupational History  . Not on file  Tobacco Use  . Smoking status: Former Smoker    Packs/day: 0.50    Years: 10.00    Pack years: 5.00    Types: Cigarettes  . Smokeless tobacco: Never Used  . Tobacco comment: uses vape now  Substance and Sexual Activity  . Alcohol use: No  . Drug use: No    Types: Benzodiazepines, Cocaine, Amphetamines, Marijuana    Comment: Previously on Suboxone and is tested monthly and random  . Sexual activity: Yes    Birth control/protection: Surgical  Other Topics Concern  . Not on file  Social History Narrative  . Not on file   Social Determinants of Health   Financial Resource Strain: Not on file  Food Insecurity: Not on file  Transportation Needs: Not on file  Physical Activity: Not  on file  Stress: Not on file  Social Connections: Not on file    Subjective: Review of Systems  Constitutional: Positive for weight loss. Negative for chills, fever and malaise/fatigue.  HENT: Negative for congestion and sore throat.   Respiratory: Negative for cough and shortness of breath.   Cardiovascular: Negative for chest pain and palpitations.  Gastrointestinal: Negative for abdominal pain, blood in stool, diarrhea, melena, nausea and vomiting.  Musculoskeletal: Negative for joint pain and myalgias.  Skin: Negative for rash.  Neurological: Positive for sensory change (tingling in her head/scalp). Negative for dizziness and weakness.  Endo/Heme/Allergies: Does not bruise/bleed easily.  Psychiatric/Behavioral: Positive for memory loss. Negative for depression. The patient is nervous/anxious and has insomnia.   All other systems reviewed and are negative.    Objective: BP 140/88   Pulse 91   Temp (!) 97.3 F (36.3 C)   Ht 5\' 4"  (1.626 m)   Wt 134 lb 12.8 oz (61.1 kg)   LMP 01/29/2020   BMI 23.14 kg/m  Physical Exam Vitals and nursing note reviewed.  Constitutional:      General: She is not in acute distress.    Appearance: Normal appearance. She is well-developed and normal weight. She is not ill-appearing, toxic-appearing or diaphoretic.  HENT:     Head: Normocephalic and atraumatic.     Nose: No congestion or rhinorrhea.  Eyes:     General: No scleral icterus. Cardiovascular:     Rate and Rhythm: Normal rate and regular rhythm.     Heart sounds: Normal heart sounds.  Pulmonary:     Effort: Pulmonary effort is normal. No respiratory distress.     Breath sounds: Normal breath sounds.  Abdominal:     General: Bowel sounds are normal.     Palpations: Abdomen is soft. There is no hepatomegaly, splenomegaly or mass.     Tenderness: There is no abdominal tenderness. There is no guarding or rebound.     Hernia: No hernia is present.  Skin:    General: Skin is warm  and dry.     Coloration: Skin is not jaundiced.     Findings: No rash.  Neurological:     General: No focal deficit present.     Mental Status: She is alert and oriented to person, place, and time.  Psychiatric:        Attention and Perception: Attention  normal.        Mood and Affect: Mood is anxious.        Speech: Speech normal.        Behavior: Behavior normal.        Thought Content: Thought content normal.        Cognition and Memory: Cognition and memory normal.      Assessment:  Pleasant 40 year old female who presents for follow-up on hepatitis C currently undergoing treatment with Epclusa.  Known history of polysubstance abuse, although she states she does not use IV drugs in many months.  She does have tattoos as well.  Ex-husband recently tested negative for hepatitis C, current partner 3 years unknown status.  She was found to be confirmed hepatitis C genotype 1a with RNA 23,500 copies.  She was started on hepatitis C treatment with Epclusa.  Approximate start date around December 15, 2019.  Her 4-week labs with RNA undetected.  Today she complains of unusual symptoms including memory lapses, feeling like parts of her skull are "sinking" as well as scalp tingling and warm feeling.  We had a very long discussion with her father present.  She states she has been quite anxious since "they called me and told me I am sick and started doing a lot of tests" which ended up being the communication from the [place] that she was hepatitis C positive and the following appropriate work-up.  She discusses specifically scared of having any kind of cuts around her children to prevent infecting them.  She seems quite anxious about her diagnosis.  Review of medication label and adverse effects do not find anything of this sort as known adverse effects.  She does have some insomnia, which is known to occur occasionally.  However, I communicated with the pharmaceutical representative who is reported these  adverse effects as possible drug-related for appropriate follow-up.  Overall, I feel her symptoms are more indicative of anxiety, especially given history of polysubstance use, anxiety/depression, and posttraumatic stress disorder.  It should also be noted that her Wellbutrin was not sent by the [facility] and she subsequently stopped Wellbutrin cold [place] 2 weeks ago when she ran out.  This could be contributing.  We had a discussion of the high likelihood of success with treatment, I reassured her that hepatitis C is important to be cautious about but not be afraid of, especially while she is undergoing treatment.  We discussed the degree of infectiousness which is present but not as high as of some other infections such as hepatitis B or HIV.  We talked about appropriate precautions.  I recommended that she contact her psych team and primary care at the [facility] to discuss missing medication, her symptoms, and asked to be seen or evaluated in some manner, even if telephonic.  Hopefully they can help manage her anxiety which I feel is the core issue driving her current presentation.  After our discussion we decided to continue with Epclusa.  She should finish around February 15.  I will plan to see her at that time because of her symptoms to evaluate how she is doing.  I have asked her to call us for any worsening or severe symptoms.  We discussed definition of cure being undetectable viral RNA 3 months after treatment.  She seems quite committed to completing her treatment.  I feel she has good family support as well.   Plan: 1. Continue Epclusa 2. Follow-up with psych care at the [facility] in [place], [place] 3.  Continue other current medications 4. Plan for labs at completion in about 2 months 5. Plan for follow-up around that time as well.   6. Anticipate final set of labs 12 weeks after treatment completion 7. Call us for any problems.    Thank you for allowing [place] to participate in the care of Marcia Schillings, DNP, AGNP-C Adult & Gerontological Nurse Practitioner [facility]   02/06/2020 2:38 PM   Disclaimer: This note was dictated with voice recognition software. Similar sounding words can inadvertently be transcribed and may not be corrected upon review.

## 2020-02-06 ENCOUNTER — Encounter: Payer: Self-pay | Admitting: Nurse Practitioner

## 2020-02-06 DIAGNOSIS — F32A Depression, unspecified: Secondary | ICD-10-CM | POA: Insufficient documentation

## 2020-02-06 DIAGNOSIS — F419 Anxiety disorder, unspecified: Secondary | ICD-10-CM | POA: Insufficient documentation

## 2020-02-06 DIAGNOSIS — R202 Paresthesia of skin: Secondary | ICD-10-CM | POA: Insufficient documentation

## 2020-02-06 DIAGNOSIS — R413 Other amnesia: Secondary | ICD-10-CM | POA: Insufficient documentation

## 2020-02-06 DIAGNOSIS — B182 Chronic viral hepatitis C: Secondary | ICD-10-CM | POA: Insufficient documentation

## 2020-02-25 ENCOUNTER — Ambulatory Visit: Payer: No Typology Code available for payment source | Admitting: Gastroenterology

## 2020-03-11 ENCOUNTER — Ambulatory Visit (HOSPITAL_COMMUNITY)
Admission: RE | Admit: 2020-03-11 | Discharge: 2020-03-11 | Disposition: A | Discharge status: Home / Self Care | Payer: Self-pay | Attending: Psychiatry | Admitting: Psychiatry

## 2020-03-11 DIAGNOSIS — F32A Depression, unspecified: Secondary | ICD-10-CM | POA: Insufficient documentation

## 2020-03-11 DIAGNOSIS — Z1339 Encounter for screening examination for other mental health and behavioral disorders: Secondary | ICD-10-CM | POA: Insufficient documentation

## 2020-03-11 DIAGNOSIS — F419 Anxiety disorder, unspecified: Secondary | ICD-10-CM | POA: Insufficient documentation

## 2020-03-11 DIAGNOSIS — F1911 Other psychoactive substance abuse, in remission: Secondary | ICD-10-CM | POA: Insufficient documentation
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
## 2020-03-11 NOTE — H&P (Addendum)
Behavioral Health Medical Screening Exam  Marcia Hood is an 40 y.o. 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 Patient is seen by psychiatry at the [place]. She reports medication compliance. She reports having a visit with her psychiatrist at the [place] today, which went well and she denies them referring her here for assessment. She states she is presenting because she was curious to see the hospital. She appears euthymic and reports good mood today. Denies SI/HI/AVH. Denies any drug or alcohol use in the last 6 months. She states her brother was a patient at [facility] and had a good experience, and she is interested in what mental health resources [facility] has to offer.   Total Time spent with patient: 15 minutes  Psychiatric Specialty Exam: Physical Exam Vitals reviewed.  Constitutional:      Appearance: She is well-developed and well-nourished.  Pulmonary:     Effort: Pulmonary effort is normal.  Musculoskeletal:        General: Normal range of motion.  Neurological:     Mental Status: She is alert and oriented to person, place, and time.    Review of Systems  Constitutional: Negative.   Respiratory: Negative for cough and shortness of breath.   Psychiatric/Behavioral: Negative for agitation, behavioral problems, confusion, decreased concentration, dysphoric mood, hallucinations, self-injury, sleep disturbance and suicidal ideas. The patient is not nervous/anxious and is not hyperactive.    There were no vitals taken for this visit.There is no height or weight on file to calculate BMI. General Appearance: Casual Eye Contact:  Good Speech:  Normal Rate Volume:  Normal Mood:  Euthymic Affect:  Appropriate and Congruent Thought Process:  Coherent and Goal Directed Orientation:  Full (Time, Place, and Person) Thought Content:  Logical Suicidal Thoughts:  No Homicidal Thoughts:  No Memory:  Immediate;   Fair Recent;   Fair Remote;    Fair Judgement:  Fair Insight:  Fair Psychomotor Activity:  Normal Concentration: Concentration: Good and Attention Span: Good Recall:  [employer] of Knowledge:Fair Language: Good Akathisia:  No Handed:  Right AIMS (if indicated):    Assets:  Communication Skills Desire for Improvement Housing Leisure Time Resilience Sleep:     Musculoskeletal: Strength & Muscle Tone: within normal limits Gait & Station: normal Patient leans: N/A  There were no vitals taken for this visit.  Recommendations: Based on my evaluation the patient does not appear to have an emergency medical condition.  Outpatient referrals. Night shift TTS counselor to see for CCA.  Aldean Baker, NP 03/11/2020, 6:55 PM

## 2020-03-16 ENCOUNTER — Emergency Department (HOSPITAL_COMMUNITY): Payer: No Typology Code available for payment source

## 2020-03-16 ENCOUNTER — Other Ambulatory Visit: Payer: Self-pay

## 2020-03-16 ENCOUNTER — Encounter (HOSPITAL_COMMUNITY): Payer: Self-pay | Admitting: *Deleted

## 2020-03-16 ENCOUNTER — Emergency Department (HOSPITAL_COMMUNITY)
Admission: EM | Admit: 2020-03-16 | Discharge: 2020-03-16 | Disposition: A | Discharge status: Home / Self Care | Payer: No Typology Code available for payment source | Attending: Emergency Medicine | Admitting: Emergency Medicine

## 2020-03-16 DIAGNOSIS — Z87891 Personal history of nicotine dependence: Secondary | ICD-10-CM | POA: Insufficient documentation

## 2020-03-16 DIAGNOSIS — Z79899 Other long term (current) drug therapy: Secondary | ICD-10-CM | POA: Insufficient documentation

## 2020-03-16 DIAGNOSIS — G8929 Other chronic pain: Secondary | ICD-10-CM | POA: Diagnosis not present

## 2020-03-16 DIAGNOSIS — R52 Pain, unspecified: Secondary | ICD-10-CM

## 2020-03-16 DIAGNOSIS — R202 Paresthesia of skin: Secondary | ICD-10-CM | POA: Diagnosis not present

## 2020-03-16 DIAGNOSIS — I10 Essential (primary) hypertension: Secondary | ICD-10-CM | POA: Insufficient documentation

## 2020-03-16 DIAGNOSIS — M545 Low back pain, unspecified: Secondary | ICD-10-CM | POA: Diagnosis not present

## 2020-03-16 DIAGNOSIS — M25551 Pain in right hip: Secondary | ICD-10-CM | POA: Diagnosis not present

## 2020-03-16 LAB — BASIC METABOLIC PANEL
Anion gap: 6 (ref 5–15)
BUN: 13 mg/dL (ref 6–20)
CO2: 26 mmol/L (ref 22–32)
Calcium: 9.6 mg/dL (ref 8.9–10.3)
Chloride: 103 mmol/L (ref 98–111)
Creatinine, Ser: 0.69 mg/dL (ref 0.44–1.00)
GFR, Estimated: >60 mL/min (ref >60)
Glucose, Bld: 97 mg/dL (ref 70–99)
Potassium: 4.7 mmol/L (ref 3.5–5.1)
Sodium: 135 mmol/L (ref 135–145)

## 2020-03-16 LAB — CBC
HCT: 34.6 % — ABNORMAL LOW (ref 36.0–46.0)
Hemoglobin: 11.3 g/dL — ABNORMAL LOW (ref 12.0–15.0)
MCH: 31.5 pg (ref 26.0–34.0)
MCHC: 32.7 g/dL (ref 30.0–36.0)
MCV: 96.4 fL (ref 80.0–100.0)
Platelets: 268 10*3/uL (ref 150–400)
RBC: 3.59 MIL/uL — ABNORMAL LOW (ref 3.87–5.11)
RDW: 12.5 % (ref 11.5–15.5)
WBC: 8 10*3/uL (ref 4.0–10.5)
nRBC: 0 % (ref 0.0–0.2)

## 2020-03-16 LAB — TROPONIN I (HIGH SENSITIVITY)
Troponin I (High Sensitivity): <2 ng/L (ref <18)
Troponin I (High Sensitivity): 2 ng/L (ref <18)

## 2020-03-16 MED ORDER — CELECOXIB 100 MG PO CAPS: 100.0000 mg | ORAL_CAPSULE | Freq: Two times a day (BID) | ORAL | 0 refills | Status: DC

## 2020-03-16 NOTE — ED Provider Notes (Addendum)
[facility] EMERGENCY DEPARTMENT Provider Note   CSN: [id number] Arrival date & time: 03/16/20  1412     History Chief Complaint  Patient presents with  . Hip Pain    Marcia Hood is a 40 y.o. female.  Pt complains of back pain and right hip pain along with numbness on her entire right side of her body  The history is provided by the patient and medical records. No language interpreter was used.  Hip Pain This is a recurrent problem. The current episode started more than 2 days ago. The problem occurs constantly. The problem has not changed since onset.Pertinent negatives include no chest pain, no abdominal pain and no headaches. Nothing aggravates the symptoms. Nothing relieves the symptoms. She has tried nothing for the symptoms.       Past Medical History:  Diagnosis Date  . Anxiety   . Chronic back pain   . Depression   . Hypertension   . Migraine   . Polysubstance abuse (HCC)   . PTSD (post-traumatic stress disorder)     Patient Active Problem List   Diagnosis Date Noted  . Chronic hepatitis C (HCC) 02/06/2020  . Anxiety and depression 02/06/2020  . Memory loss 02/06/2020  . Tingling sensation 02/06/2020  . Positive hepatitis C antibody test 11/18/2019  . Left leg cellulitis 06/10/2016  . Nicotine dependence 06/10/2016  . Severe episode of recurrent major depressive disorder, without psychotic features (HCC)   . ADHD (attention deficit hyperactivity disorder) 06/06/2015  . PTSD (post-traumatic stress disorder) 06/06/2015  . Marijuana abuse 06/06/2015  . Supervision of other normal pregnancy 09/06/2012  . Maternal chronic hypertension 08/12/2012  . High-risk pregnancy 05/16/2012  . 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 CARPAL TUNNEL RELEASE    . CESAREAN SECTION N/A 09/28/2012   Procedure: Primary cesarean section with delivery of baby girl at [id number]. ;  Surgeon: Catalina AntiguaPeggy Constant, MD;   Location: [facility];  Service: Obstetrics;  Laterality: N/A;  . INCISION AND DRAINAGE ABSCESS Left 06/12/2016   Procedure: INCISION AND DRAINAGE OF ABSCESS, LEFT THIGH;  Surgeon: Franky MachoJenkins, Mark, MD;  Location: [facility];  Service: General;  Laterality: Left;  . TUBAL LIGATION       OB History    Gravida  3   Para  3   Term  3   Preterm  0   AB  0   Living  3     SAB  0   IAB  0   Ectopic  0   Multiple  0   Live Births  3           Family History  Problem Relation Age of Onset  . Hypertension Mother   . Diabetes Father   . Heart attack Maternal Grandmother   . Cancer Maternal Grandmother        Bladder  . Anesthesia problems Neg Hx   . Liver disease Neg Hx     Social History   Tobacco Use  . Smoking status: Former Smoker    Packs/day: 0.50    Years: 10.00    Pack years: 5.00    Types: Cigarettes  . Smokeless tobacco: Never Used  . Tobacco comment: uses vape now  Substance Use Topics  . Alcohol use: No  . Drug use: No    Types: Benzodiazepines, Cocaine, Amphetamines, Marijuana    Comment: Previously on Suboxone and is tested monthly and random    Home  Medications Prior to Admission medications   Medication Sig Start Date End Date Taking? Authorizing Provider  amphetamine-dextroamphetamine (ADDERALL) 30 MG tablet Take 1 tablet by mouth daily. For ADHD Patient taking differently: Take 10-20 mg by mouth See admin instructions. 20mg  in AM and 10mg  in early afternoon when she remembers. At times will skip doses 06/10/15  Yes Nwoko, I, NP  buPROPion (WELLBUTRIN XL) 150 MG 24 hr tablet Take 150 mg by mouth daily. 02/09/20  Yes [provider]  celecoxib (CELEBREX) 100 MG capsule Take 1 capsule (100 mg total) by mouth 2 (two) times daily. 03/16/20  Yes 04/08/20, MD  ferrous sulfate 324 MG TBEC Take 1 tablet by mouth daily. 09/25/19  Yes [provider]  gabapentin (NEURONTIN) 300 MG capsule Take 2 capsules (600 mg total) by mouth 3  (three) times daily. For agitation Patient taking differently: Take 1,200 mg by mouth daily. 3-4 at bedtime as needed 06/10/15  Yes Nwoko, 09/27/19 I, NP  naproxen sodium (ALEVE) 220 MG tablet Take 220 mg by mouth.   Yes [provider]  omeprazole (PRILOSEC) 40 MG capsule Take 80 mg by mouth daily. 03/02/20  Yes [provider]  rOPINIRole (REQUIP) 1 MG tablet Take 1 tablet (1 mg total) by mouth at bedtime. For restless leg 06/10/15  Yes Nwoko, 04/30/20 I, NP  senna-docusate (SENOKOT-S) 8.6-50 MG tablet Take 2 tablets by mouth every evening. 03/02/20  Yes [provider]  Sofosbuvir-Velpatasvir (EPCLUSA PO) Take 1 tablet by mouth daily. Once a day   Yes [provider]  SUMAtriptan (IMITREX) 50 MG tablet Take 50 mg by mouth every 2 (two) hours as needed for migraine. 03/02/20  Yes [provider]  zolpidem (AMBIEN) 5 MG tablet Take 1 tablet (5 mg total) by mouth at bedtime as needed for sleep. Patient taking differently: Take 10 mg by mouth at bedtime as needed for sleep. 06/10/15  Yes 04/30/20 I, NP  traZODone (DESYREL) 150 MG tablet Take 150 mg by mouth at bedtime as needed for sleep. Patient not taking: Reported on 03/16/2020    [provider]    Allergies    Penicillins  Review of Systems   Review of Systems  Constitutional: Negative for appetite change and fatigue.  HENT: Negative for congestion, ear discharge and sinus pressure.   Eyes: Negative for discharge.  Respiratory: Negative for cough.   Cardiovascular: Negative for chest pain.  Gastrointestinal: Negative for abdominal pain and diarrhea.  Genitourinary: Negative for frequency and hematuria.  Musculoskeletal: Positive for back pain.  Skin: Negative for rash.  Neurological: Negative for seizures and headaches.       Numbness to right side of body  Psychiatric/Behavioral: Negative for hallucinations.    Physical Exam Updated Vital Signs BP 105/71 (BP Location: Left Arm)    Pulse 85   Temp 98.6 F (37 C) (Oral)   Resp 16   Ht 5\' 4"  (1.626 m)   Wt 62 kg   LMP 02/28/2020   SpO2 100%   BMI 23.46 kg/m   Physical Exam Vitals and nursing note reviewed.  Constitutional:      Appearance: She is well-developed.  HENT:     Head: Normocephalic.     Nose: Nose normal.  Eyes:     General: No scleral icterus.    Extraocular Movements: EOM normal.     Conjunctiva/sclera: Conjunctivae normal.  Neck:     Thyroid: No thyromegaly.  Cardiovascular:     Rate and Rhythm:  Normal rate and regular rhythm.     Heart sounds: No murmur heard. No friction rub. No gallop.   Pulmonary:     Breath sounds: No stridor. No wheezing or rales.  Chest:     Chest wall: No tenderness.  Abdominal:     General: There is no distension.     Tenderness: There is no abdominal tenderness. There is no rebound.  Musculoskeletal:        General: No edema. Normal range of motion.     Cervical back: Neck supple.     Comments: Tender lumbar spine and right hip  Lymphadenopathy:     Cervical: No cervical adenopathy.  Skin:    Findings: No erythema or rash.  Neurological:     Mental Status: She is alert and oriented to person, place, and time.     Motor: No abnormal muscle tone.     Coordination: Coordination normal.  Psychiatric:        Mood and Affect: Mood and affect normal.        Behavior: Behavior normal.     ED Results / Procedures / Treatments   Labs (all labs ordered are listed, but only abnormal results are displayed) Labs Reviewed  CBC - Abnormal; Notable for the following components:      Result Value   RBC 3.59 (*)    Hemoglobin 11.3 (*)    HCT 34.6 (*)    All other components within normal limits  BASIC METABOLIC PANEL  TROPONIN I (HIGH SENSITIVITY)  TROPONIN I (HIGH SENSITIVITY)    EKG EKG Interpretation  Date/Time:  Tuesday March 16 2020 14:23:18 EST Ventricular Rate:  90 PR Interval:  144 QRS Duration: 70 QT Interval:  328 QTC  Calculation: 401 R Axis:   77 Text Interpretation: Normal sinus rhythm Nonspecific ST abnormality Abnormal ECG No significant change since last tracing Confirmed by Susy Frizzle [phone]) on 03/16/2020 2:34:18 PM   Radiology DG Chest 2 View  Result Date: 03/16/2020 CLINICAL DATA:  Numbness. EXAM: CHEST - 2 VIEW COMPARISON:  Jun 19, 2016 FINDINGS: The heart size and mediastinal contours are within normal limits. Both lungs are clear. The visualized skeletal structures are unremarkable. IMPRESSION: No active cardiopulmonary disease. Electronically Signed   By: Katherine Mantle M.D.   On: 03/16/2020 15:28   DG Lumbar Spine Complete  Result Date: 03/16/2020 CLINICAL DATA:  Right pain EXAM: LUMBAR SPINE - COMPLETE 4+ VIEW COMPARISON:  12/07/2014 FINDINGS: Tubal ligation clips in the pelvis. Mild levoscoliosis. Sagittal alignment within normal limits. Vertebral body heights are maintained. Mild to moderate disc space narrowing and degenerative change at L5-S1 progressed compared to prior. 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 Electronically Signed   By: Jasmine Pang M.D.   On: 03/16/2020 17:54   CT Head Wo Contrast  Result Date: 03/16/2020 CLINICAL DATA:  Right-sided headache for 1 month. EXAM: CT HEAD WITHOUT CONTRAST TECHNIQUE: Contiguous axial images were obtained from the base of the skull through the vertex without intravenous contrast. COMPARISON:  CT head dated 01/09/2020. FINDINGS: Brain: No evidence of acute infarction, hemorrhage, hydrocephalus, extra-axial collection or mass lesion/mass effect. Vascular: No hyperdense vessel or unexpected calcification. Skull: Normal. Negative for fracture or focal lesion. Sinuses/Orbits: No acute finding. Other: None. IMPRESSION: No acute intracranial process. Electronically Signed   By: Romona Curls M.D.   On: 03/16/2020 17:03   DG HIP UNILAT WITH PELVIS 2-3 VIEWS RIGHT  Result Date:  03/16/2020 CLINICAL DATA:  Hip pain EXAM: DG HIP (WITH OR WITHOUT PELVIS) 2-3V RIGHT COMPARISON:  None. FINDINGS: There is no evidence of hip fracture or dislocation. There is no evidence of arthropathy or other focal bone abnormality. IMPRESSION: Negative. Electronically Signed   By: Jasmine Pang M.D.   On: 03/16/2020 17:54    Procedures Procedures   Medications Ordered in ED Medications - No data to display  ED Course  I have reviewed the triage vital signs and the nursing notes.  Pertinent labs & imaging results that were available during my care of the patient were reviewed by me and considered in my medical decision making (see chart for details).    MDM Rules/Calculators/A&P                          Patient with degenerative changes to the lumbar spine.  She will be sent home with Celebrex.  CT scan of the head was negative I can explain this numbness on the entire half of her right side.  She will be referred to neurology Final Clinical Impression(s) / ED Diagnoses Final diagnoses:  Right hip pain    Rx / DC Orders ED Discharge Orders         Ordered    celecoxib (CELEBREX) 100 MG capsule  2 times daily        03/16/20 1825           Bethann Berkshire, MD 03/17/20 1133    Bethann Berkshire, MD 03/17/20 1135

## 2020-03-16 NOTE — ED Notes (Signed)
Pt to x-ray at CT by radiology staff via stretcher at this time.

## 2020-03-16 NOTE — ED Triage Notes (Addendum)
Pain in right side of head and right hip for months, states her thoughst have been scrambled

## 2020-03-16 NOTE — ED Notes (Signed)

## 2020-03-16 NOTE — Discharge Instructions (Addendum)
Follow-up with Dr. Gerilyn Pilgrim for your head and follow-up with Dr. Dallas Schimke for your back and hip

## 2020-03-16 NOTE — ED Notes (Signed)
While at bedside for assessment, patient states "I've been here several times and I thought I would come to just get checked from my head to my feet. I hurt on my lower back and my hip, and down my right leg. I have palpitations and figured that if something is wrong then I could be transferred." Family member reports "I'm close friends with her and her mother and she always seems confused."  Pt and family concerns acknowledged and educated on current plan of care.

## 2020-03-16 NOTE — ED Notes (Signed)
ED Provider at bedside. 

## 2020-03-16 NOTE — ED Notes (Signed)
Patient transported to X-ray 

## 2020-03-18 ENCOUNTER — Encounter: Payer: Self-pay | Admitting: Nurse Practitioner

## 2020-03-18 ENCOUNTER — Other Ambulatory Visit: Payer: Self-pay

## 2020-03-18 ENCOUNTER — Ambulatory Visit (INDEPENDENT_AMBULATORY_CARE_PROVIDER_SITE_OTHER): Payer: No Typology Code available for payment source | Admitting: Nurse Practitioner

## 2020-03-18 ENCOUNTER — Other Ambulatory Visit: Payer: Self-pay | Admitting: Nurse Practitioner

## 2020-03-18 VITALS — BP 117/77 | HR 73 | Temp 97.3°F | Ht 64.0 in | Wt 135.2 lb

## 2020-03-18 DIAGNOSIS — F4310 Post-traumatic stress disorder, unspecified: Secondary | ICD-10-CM

## 2020-03-18 DIAGNOSIS — F419 Anxiety disorder, unspecified: Secondary | ICD-10-CM | POA: Diagnosis not present

## 2020-03-18 DIAGNOSIS — F32A Depression, unspecified: Secondary | ICD-10-CM

## 2020-03-18 DIAGNOSIS — B182 Chronic viral hepatitis C: Secondary | ICD-10-CM

## 2020-03-18 NOTE — Progress Notes (Signed)
Referring Provider: [facility] Primary Care Physician:  [facility] Primary GI:  Dr. Marletta Lorarver  Chief Complaint  Patient presents with  . Follow-up    Chronic hep c. Denied alcohol/drug use currently    HPI:   Marcia Hood is a 40 y.o. female who presents for follow-up.  The patient was last seen in our office 02/05/2020 for chronic hep C, PTSD, memory loss.  Noted history of remote IV drug use but "has worked hard to get cleaned up".  She did have a slip up and used IV drugs again but was alone and did not share needles.  Bump in LFTs with associated positive hepatitis C antibody.  Her ex-husband recently tested negative for hep C and current partner unknown status.  Previously completed hep B vaccinations.  Hepatitis C work-up completed which found mild transaminitis, normal platelets/INR.  Hepatitis A antibody positive, hepatitis B serologies consistent with immunity, HIV negative, confirmed hepatitis C infection with RNA at 23,500 copies, genotype Ia.  Fibrosure consistent with no fibrosis.  Application for treatment with Dorita Fraypclusa was completed and approved.  She states she took her medicine for 3 to 4 days but then lost the bottle which was then replaced by the [facility].  No-show to follow-up visit in December 2021 but did complete labs which showed undetectable hepatitis C RNA.  Stressed importance of completing total 12 weeks medication and need for repeat labs in 3 months posttreatment to document SVR.  Estimated completion date of 03/16/2020.  ED visit in December 2021 where there is a question of psychiatric components and possible hallucinations.  At her last visit she was accompanied by her father with noted concerns of confusion, memory lapses, unusual behavior.  Subjective weight loss of 20 pounds in the past 6 months.  Has numbness and tingling in the right side of her head and feels like there are "dents in her skull like her skull is sinking."  They felt the  symptoms started when she was diagnosed with hepatitis C.  Has had a lot of anxiety about infecting her children if she bleeds around them.  Sleeping difficulties that she attributes to anxiety.  She also stopped Wellbutrin a couple weeks ago indicating she tried to order more but it was not delivered.  Overall insomnia is a possible side effect for hepatitis C.  Communicated with the pharmaceutical representative about these psychiatric concerns on the medication and she indicated she would report them as appropriate for possible drug-related adverse effect which would trigger potential follow-up as appropriate.    Overall her symptoms were felt to be more anxiety especially given history of polysubstance abuse, anxiety/depression, and PTSD.  This could have been exacerbated by cessation of Wellbutrin due to nondelivery of medication refill.  Discuss the unlikelihood of infecting her children without specific blood to blood contact, appropriate precautions.  I highly recommended that she contact her psychiatric team at the [facility] to discuss her symptoms, concerns, and medications.  Eventually we decided to continue with Epclusa to completion and requested she call us for any concerns.  Follow-up in 2 months, about the time of completion.  It appears she most recently saw psychiatry at the [facility] on 03/11/2020.  Today she states she is doing okay overall. She declined to review medication list with the nurse during check in, stating "it's the same". States she has 2 pills left, will finisheh in the next couple days. No new side effects noted. Hasn't missed any (more)  doses. Denies abdominal pain, N/V. Denies any yellowing of the skin/eyes, darkened urine, generalized tremors, generalized pruritis. She's had ER visits for pain, but no other concerns. Denies chest pain, dyspnea, dizziness, lightheadedness, syncope, near syncope. Denies any other upper or lower GI symptoms.  Past Medical History:  Diagnosis Date  .  Anxiety   . Chronic back pain   . Depression   . Hypertension   . Migraine   . Polysubstance abuse (HCC)   . PTSD (post-traumatic stress disorder)     Past Surgical History:  Procedure Laterality Date  . CARPAL TUNNEL RELEASE    . CESAREAN SECTION N/A 09/28/2012   Procedure: Primary cesarean section with delivery of baby girl at 44. ;  Surgeon: Catalina Antigua, MD;  Location: [facility];  Service: Obstetrics;  Laterality: N/A;  . INCISION AND DRAINAGE ABSCESS Left 06/12/2016   Procedure: INCISION AND DRAINAGE OF ABSCESS, LEFT THIGH;  Surgeon: Franky Macho, MD;  Location: [facility];  Service: General;  Laterality: Left;  . TUBAL LIGATION      Current Outpatient Medications  Medication Sig Dispense Refill  . amphetamine-dextroamphetamine (ADDERALL) 30 MG tablet Take 1 tablet by mouth daily. For ADHD (Patient taking differently: Take 10-20 mg by mouth See admin instructions. 20mg  in AM and 10mg  in early afternoon when she remembers. At times will skip doses)  0  . buPROPion (WELLBUTRIN XL) 150 MG 24 hr tablet Take 150 mg by mouth daily.    . celecoxib (CELEBREX) 100 MG capsule Take 1 capsule (100 mg total) by mouth 2 (two) times daily. 60 capsule 0  . ferrous sulfate 324 MG TBEC Take 1 tablet by mouth daily.    gabapentin (NEURONTIN) 300 MG capsule Take 2 capsules (600 mg total) by mouth 3 (three) times daily. For agitation (Patient taking differently: Take 1,200 mg by mouth daily. 3-4 at bedtime as needed) 180 capsule 0  . naproxen sodium (ALEVE) 220 MG tablet Take 220 mg by mouth.    omeprazole (PRILOSEC) 40 MG capsule Take 80 mg by mouth daily.    Marland Kitchen rOPINIRole (REQUIP) 1 MG tablet Take 1 tablet (1 mg total) by mouth at bedtime. For restless leg    . senna-docusate (SENOKOT-S) 8.6-50 MG tablet Take 2 tablets by mouth every evening.    . Sofosbuvir-Velpatasvir (EPCLUSA PO) Take 1 tablet by mouth daily. Once a day    . SUMAtriptan (IMITREX) 50 MG tablet Take 50 mg by mouth every 2 (two)  hours as needed for migraine.    . traZODone (DESYREL) 150 MG tablet Take 150 mg by mouth at bedtime as needed for sleep.    Marland Kitchen zolpidem (AMBIEN) 5 MG tablet Take 1 tablet (5 mg total) by mouth at bedtime as needed for sleep. (Patient taking differently: Take 10 mg by mouth at bedtime as needed for sleep.) 7 tablet 0   No current facility-administered medications for this visit.    Allergies as of 03/18/2020 - Review Complete 03/18/2020  Allergen Reaction Noted  . Penicillins Rash 05/10/2011    Family History  Problem Relation Age of Onset  . Hypertension Mother   . Diabetes Father   . Heart attack Maternal Grandmother   . Cancer Maternal Grandmother        Bladder  . Anesthesia problems Neg Hx   . Liver disease Neg Hx     Social History   Socioeconomic History  . Marital status: Legally Separated    Spouse name: Not on file  .  Number of children: Not on file  . Years of education: Not on file  . Highest education level: Not on file  Occupational History  . Not on file  Tobacco Use  . Smoking status: Former Smoker    Packs/day: 0.50    Years: 10.00    Pack years: 5.00    Types: Cigarettes  . Smokeless tobacco: Never Used  . Tobacco comment: uses vape now  Substance and Sexual Activity  . Alcohol use: No  . Drug use: No    Types: Benzodiazepines, Cocaine, Amphetamines, Marijuana    Comment: Previously on Suboxone and is tested monthly and random  . Sexual activity: Yes    Birth control/protection: Surgical  Other Topics Concern  . Not on file  Social History Narrative  . Not on file   Social Determinants of Health   Financial Resource Strain: Not on file  Food Insecurity: Not on file  Transportation Needs: Not on file  Physical Activity: Not on file  Stress: Not on file  Social Connections: Not on file    Subjective: Review of Systems  Constitutional: Negative for chills, fever, malaise/fatigue and weight loss.  HENT: Negative for congestion and sore  throat.   Respiratory: Negative for cough and shortness of breath.   Cardiovascular: Negative for chest pain and palpitations.  Gastrointestinal: Negative for abdominal pain, blood in stool, diarrhea, heartburn, melena, nausea and vomiting.  Musculoskeletal: Negative for joint pain and myalgias.  Skin: Negative for rash.  Neurological: Negative for dizziness and weakness.  Endo/Heme/Allergies: Does not bruise/bleed easily.  Psychiatric/Behavioral: Negative for depression. The patient is not nervous/anxious.   All other systems reviewed and are negative.    Objective: BP 117/77   Pulse 73   Temp (!) 97.3 F (36.3 C)   Ht 5\' 4"  (1.626 m)   Wt 135 lb 3.2 oz (61.3 kg)   LMP 02/28/2020   BMI 23.21 kg/m  Physical Exam Vitals and nursing note reviewed.  Constitutional:      General: She is not in acute distress.    Appearance: Normal appearance. She is well-developed and normal weight. She is not ill-appearing, toxic-appearing or diaphoretic.  HENT:     Head: Normocephalic and atraumatic.     Nose: No congestion or rhinorrhea.  Eyes:     General: No scleral icterus. Cardiovascular:     Rate and Rhythm: Normal rate and regular rhythm.     Heart sounds: Normal heart sounds.  Pulmonary:     Effort: Pulmonary effort is normal. No respiratory distress.     Breath sounds: Normal breath sounds.  Abdominal:     General: Bowel sounds are normal.     Palpations: Abdomen is soft. There is no hepatomegaly, splenomegaly or mass.     Tenderness: There is no abdominal tenderness. There is no guarding or rebound.     Hernia: No hernia is present.  Skin:    General: Skin is warm and dry.     Coloration: Skin is not jaundiced.     Findings: No rash.  Neurological:     General: No focal deficit present.     Mental Status: She is alert and oriented to person, place, and time.  Psychiatric:        Attention and Perception: Attention normal.        Mood and Affect: Mood normal.         Speech: Speech normal.        Behavior: Behavior normal.  Thought Content: Thought content normal.        Cognition and Memory: Cognition and memory normal.      Assessment:  40 year old female presents for follow-up on chronic hepatitis C.  She is nearing completion of her medication/Epclusa for hepatitis C virus infection.  4-week posttreatment initiation labs with undetectable hepatitis C RNA.  She is now at end of treatment and we will recheck CBC, CMP, hepatitis C RNA.  We discussed the fact that while it is reassuring that there is no RNA detectable at her last visit, and anticipate there will be none at this visit, the true definition of "cure" is no detectable RNA 3 months after treatment completion.  She verbalized understanding.  Overall, no overt GI or hepatic complaints today.   Plan: 1. CBC, CMP, hepatitis C RNA 2. Return for follow-up in 3 months for last visit and SVR labs 3. Call for any worsening or concerning symptoms 4. Continue to follow with behavioral health at the [place].    Thank you for allowing [place] to participate in the care of Cay Schillings, DNP, AGNP-C Adult & Gerontological Nurse Practitioner [facility]   03/18/2020 8:55 AM   Disclaimer: This note was dictated with voice recognition software. Similar sounding words can inadvertently be transcribed and may not be corrected upon review.

## 2020-03-18 NOTE — Addendum Note (Signed)
Addended by: Delane Ginger, Braedin Millhouse A on: 03/18/2020 09:11 AM   Modules accepted: Orders

## 2020-03-18 NOTE — Patient Instructions (Signed)
Your health issues we discussed today were:   Chronic hepatitis C: 1. Finish your last 2 doses of medication so that you complete the entire course of treatment 2. Have your labs checked when you are able to 3. We will call you with the results 4. Call us for any worsening or concerning symptoms  Overall I recommend:  1. Continue your other current medications 2. Continue to follow-up with behavioral health at the [facility] 3. Return for follow-up in 3 months 4. Call us if you have any questions or concerns   ---------------------------------------------------------------  At [facility] we value your feedback. You may receive a survey about your visit today. Please share your experience as we strive to create trusting relationships with our patients to provide genuine, compassionate, quality care.  We appreciate your understanding and patience as we review any laboratory studies, imaging, and other diagnostic tests that are ordered as we care for you. Our office policy is 5 business days for review of these results, and any emergent or urgent results are addressed in a timely manner for your best interest. If you do not hear from our office in 1 week, please contact us.   We also encourage the use of MyChart, which contains your medical information for your review as well. If you are not enrolled in this feature, an access code is on this after visit summary for your convenience. Thank you for allowing [place] to be involved in your care.  It was great to see you today!  I hope you have a safe and warm winter!!    ---------------------------------------------------------------

## 2020-03-21 LAB — COMPREHENSIVE METABOLIC PANEL
ALT: 24 IU/L (ref 0–32)
AST: 16 IU/L (ref 0–40)
Albumin/Globulin Ratio: 1.7 (ref 1.2–2.2)
Albumin: 4.3 g/dL (ref 3.8–4.8)
Alkaline Phosphatase: 54 IU/L (ref 44–121)
BUN/Creatinine Ratio: 12 (ref 9–23)
BUN: 9 mg/dL (ref 6–20)
Bilirubin Total: 0.3 mg/dL (ref 0.0–1.2)
CO2: 21 mmol/L (ref 20–29)
Calcium: 9.9 mg/dL (ref 8.7–10.2)
Chloride: 103 mmol/L (ref 96–106)
Creatinine, Ser: 0.78 mg/dL (ref 0.57–1.00)
GFR calc Af Amer: 112 mL/min/{1.73_m2} (ref >59)
GFR calc non Af Amer: 97 mL/min/{1.73_m2} (ref >59)
Globulin, Total: 2.6 g/dL (ref 1.5–4.5)
Glucose: 95 mg/dL (ref 65–99)
Potassium: 5.3 mmol/L — ABNORMAL HIGH (ref 3.5–5.2)
Sodium: 140 mmol/L (ref 134–144)
Total Protein: 6.9 g/dL (ref 6.0–8.5)

## 2020-03-21 LAB — CBC WITH DIFFERENTIAL/PLATELET
Basophils Absolute: 0 10*3/uL (ref 0.0–0.2)
Basos: 1 %
EOS (ABSOLUTE): 0.2 10*3/uL (ref 0.0–0.4)
Eos: 3 %
Hematocrit: 37.6 % (ref 34.0–46.6)
Hemoglobin: 12.6 g/dL (ref 11.1–15.9)
Immature Grans (Abs): 0 10*3/uL (ref 0.0–0.1)
Immature Granulocytes: 0 %
Lymphocytes Absolute: 1.9 10*3/uL (ref 0.7–3.1)
Lymphs: 24 %
MCH: 31.7 pg (ref 26.6–33.0)
MCHC: 33.5 g/dL (ref 31.5–35.7)
MCV: 95 fL (ref 79–97)
Monocytes Absolute: 0.5 10*3/uL (ref 0.1–0.9)
Monocytes: 6 %
Neutrophils Absolute: 5.4 10*3/uL (ref 1.4–7.0)
Neutrophils: 66 %
Platelets: 282 10*3/uL (ref 150–450)
RBC: 3.98 x10E6/uL (ref 3.77–5.28)
RDW: 11.9 % (ref 11.7–15.4)
WBC: 8.1 10*3/uL (ref 3.4–10.8)

## 2020-03-21 LAB — HCV RNA NAA QUAL RFX TO QUANT: HCV RNA NAA Qualitative: NEGATIVE

## 2020-03-22 NOTE — Progress Notes (Signed)
Cc'ed to pcp °

## 2020-04-04 ENCOUNTER — Other Ambulatory Visit: Payer: Self-pay

## 2020-04-04 ENCOUNTER — Encounter (HOSPITAL_COMMUNITY): Payer: Self-pay

## 2020-04-04 ENCOUNTER — Emergency Department (HOSPITAL_COMMUNITY)
Admission: EM | Admit: 2020-04-04 | Discharge: 2020-04-05 | Disposition: A | Discharge status: Home / Self Care | Payer: No Typology Code available for payment source | Attending: Emergency Medicine | Admitting: Emergency Medicine

## 2020-04-04 DIAGNOSIS — F909 Attention-deficit hyperactivity disorder, unspecified type: Secondary | ICD-10-CM | POA: Insufficient documentation

## 2020-04-04 DIAGNOSIS — Z008 Encounter for other general examination: Secondary | ICD-10-CM | POA: Diagnosis not present

## 2020-04-04 DIAGNOSIS — Z23 Encounter for immunization: Secondary | ICD-10-CM | POA: Diagnosis not present

## 2020-04-04 DIAGNOSIS — Z046 Encounter for general psychiatric examination, requested by authority: Secondary | ICD-10-CM | POA: Insufficient documentation

## 2020-04-04 DIAGNOSIS — I10 Essential (primary) hypertension: Secondary | ICD-10-CM | POA: Diagnosis not present

## 2020-04-04 DIAGNOSIS — F17290 Nicotine dependence, other tobacco product, uncomplicated: Secondary | ICD-10-CM | POA: Diagnosis not present

## 2020-04-04 DIAGNOSIS — Z20822 Contact with and (suspected) exposure to covid-19: Secondary | ICD-10-CM | POA: Diagnosis not present

## 2020-04-04 DIAGNOSIS — Z79899 Other long term (current) drug therapy: Secondary | ICD-10-CM | POA: Diagnosis not present

## 2020-04-04 LAB — CBC WITH DIFFERENTIAL/PLATELET
Abs Immature Granulocytes: 0.05 10*3/uL (ref 0.00–0.07)
Basophils Absolute: 0.1 10*3/uL (ref 0.0–0.1)
Basophils Relative: 1 %
Eosinophils Absolute: 0.1 10*3/uL (ref 0.0–0.5)
Eosinophils Relative: 1 %
HCT: 35.5 % — ABNORMAL LOW (ref 36.0–46.0)
Hemoglobin: 11.9 g/dL — ABNORMAL LOW (ref 12.0–15.0)
Immature Granulocytes: 1 %
Lymphocytes Relative: 26 %
Lymphs Abs: 2.4 10*3/uL (ref 0.7–4.0)
MCH: 32 pg (ref 26.0–34.0)
MCHC: 33.5 g/dL (ref 30.0–36.0)
MCV: 95.4 fL (ref 80.0–100.0)
Monocytes Absolute: 0.6 10*3/uL (ref 0.1–1.0)
Monocytes Relative: 6 %
Neutro Abs: 6.2 10*3/uL (ref 1.7–7.7)
Neutrophils Relative %: 65 %
Platelets: 295 10*3/uL (ref 150–400)
RBC: 3.72 MIL/uL — ABNORMAL LOW (ref 3.87–5.11)
RDW: 12.8 % (ref 11.5–15.5)
WBC: 9.4 10*3/uL (ref 4.0–10.5)
nRBC: 0 % (ref 0.0–0.2)

## 2020-04-04 LAB — RAPID URINE DRUG SCREEN, HOSP PERFORMED
Amphetamines: POSITIVE — AB
Barbiturates: NOT DETECTED
Benzodiazepines: NOT DETECTED
Cocaine: NOT DETECTED
Opiates: NOT DETECTED
Tetrahydrocannabinol: POSITIVE — AB

## 2020-04-04 LAB — ETHANOL: Alcohol, Ethyl (B): <10 mg/dL (ref <10)

## 2020-04-04 LAB — RESP PANEL BY RT-PCR (FLU A&B, COVID) ARPGX2
Influenza A by PCR: NEGATIVE
Influenza B by PCR: NEGATIVE
SARS Coronavirus 2 by RT PCR: NEGATIVE

## 2020-04-04 LAB — COMPREHENSIVE METABOLIC PANEL
ALT: 22 U/L (ref 0–44)
AST: 23 U/L (ref 15–41)
Albumin: 4.2 g/dL (ref 3.5–5.0)
Alkaline Phosphatase: 45 U/L (ref 38–126)
Anion gap: 9 (ref 5–15)
BUN: 14 mg/dL (ref 6–20)
CO2: 26 mmol/L (ref 22–32)
Calcium: 9.7 mg/dL (ref 8.9–10.3)
Chloride: 104 mmol/L (ref 98–111)
Creatinine, Ser: 0.84 mg/dL (ref 0.44–1.00)
GFR, Estimated: >60 mL/min (ref >60)
Glucose, Bld: 69 mg/dL — ABNORMAL LOW (ref 70–99)
Potassium: 3.4 mmol/L — ABNORMAL LOW (ref 3.5–5.1)
Sodium: 139 mmol/L (ref 135–145)
Total Bilirubin: 0.6 mg/dL (ref 0.3–1.2)
Total Protein: 7.3 g/dL (ref 6.5–8.1)

## 2020-04-04 LAB — CBG MONITORING, ED: Glucose-Capillary: 92 mg/dL (ref 70–99)

## 2020-04-04 LAB — SALICYLATE LEVEL: Salicylate Lvl: <7 mg/dL — ABNORMAL LOW (ref 7.0–30.0)

## 2020-04-04 LAB — ACETAMINOPHEN LEVEL: Acetaminophen (Tylenol), Serum: <10 ug/mL — ABNORMAL LOW (ref 10–30)

## 2020-04-04 LAB — POC URINE PREG, ED: Preg Test, Ur: NEGATIVE

## 2020-04-04 MED ORDER — BUPROPION HCL ER (XL) 150 MG PO TB24
150.0000 mg | ORAL_TABLET | Freq: Every day | ORAL | Status: DC
  Administered 2020-04-05: 150 mg via ORAL
  Filled 2020-04-04: qty 1

## 2020-04-04 MED ORDER — ROPINIROLE HCL 1 MG PO TABS
1.0000 mg | ORAL_TABLET | Freq: Every day | ORAL | Status: DC
  Administered 2020-04-04: 1 mg via ORAL
  Filled 2020-04-04: qty 1

## 2020-04-04 MED ORDER — DOXYCYCLINE HYCLATE 100 MG PO TABS: 100.0000 mg | ORAL_TABLET | Freq: Once | ORAL | Status: DC

## 2020-04-04 MED ORDER — GABAPENTIN 300 MG PO CAPS
600.0000 mg | ORAL_CAPSULE | Freq: Three times a day (TID) | ORAL | Status: DC
  Administered 2020-04-04 – 2020-04-05 (×2): 600 mg via ORAL
  Filled 2020-04-04 (×2): qty 2

## 2020-04-04 MED ORDER — TETANUS-DIPHTH-ACELL PERTUSSIS 5-2.5-18.5 LF-MCG/0.5 IM SUSY
0.5000 mL | PREFILLED_SYRINGE | Freq: Once | INTRAMUSCULAR | Status: AC
  Administered 2020-04-04: 0.5 mL via INTRAMUSCULAR
  Filled 2020-04-04: qty 0.5

## 2020-04-04 MED ORDER — SENNOSIDES-DOCUSATE SODIUM 8.6-50 MG PO TABS: 2.0000 | ORAL_TABLET | Freq: Every evening | ORAL | Status: DC

## 2020-04-04 MED ORDER — DOXYCYCLINE HYCLATE 100 MG PO TABS
100.0000 mg | ORAL_TABLET | Freq: Two times a day (BID) | ORAL | Status: DC
  Administered 2020-04-04 – 2020-04-05 (×2): 100 mg via ORAL
  Filled 2020-04-04 (×2): qty 1

## 2020-04-04 MED ORDER — TRAZODONE HCL 50 MG PO TABS
150.0000 mg | ORAL_TABLET | Freq: Every evening | ORAL | Status: DC | PRN
  Administered 2020-04-04: 150 mg via ORAL
  Filled 2020-04-04: qty 3

## 2020-04-04 MED ORDER — SUMATRIPTAN SUCCINATE 50 MG PO TABS
50.0000 mg | ORAL_TABLET | ORAL | Status: DC | PRN
  Filled 2020-04-04: qty 1

## 2020-04-04 MED ORDER — PANTOPRAZOLE SODIUM 40 MG PO TBEC
40.0000 mg | DELAYED_RELEASE_TABLET | Freq: Every day | ORAL | Status: DC
  Administered 2020-04-05: 40 mg via ORAL
  Filled 2020-04-04: qty 1

## 2020-04-04 MED ORDER — AMPHETAMINE-DEXTROAMPHET ER 30 MG PO CP24
30.0000 mg | ORAL_CAPSULE | Freq: Every day | ORAL | Status: DC
  Administered 2020-04-05: 30 mg via ORAL
  Filled 2020-04-04: qty 1

## 2020-04-04 MED ORDER — CELECOXIB 100 MG PO CAPS
100.0000 mg | ORAL_CAPSULE | Freq: Two times a day (BID) | ORAL | Status: DC
  Administered 2020-04-04 – 2020-04-05 (×2): 100 mg via ORAL
  Filled 2020-04-04 (×2): qty 1

## 2020-04-04 NOTE — ED Triage Notes (Signed)
Pt to er, pt states that she is here because she "needs a mental break"  States that she "has had enough of people telling me that I need a break, so I need a fucking break".  Pt states that people are getting on her nerves, denies abuse or neglect at home. Pt denies si or his, states "I am just pissed"

## 2020-04-04 NOTE — ED Notes (Signed)
Bookbag and labeled bag of clothing on shelf.

## 2020-04-04 NOTE — BH Assessment (Signed)
For additional information please call Vianka Ertel, mother, [phone].  *With pt's consent.*   Redmond Pulling, MS, [facility], [facility] Triage Specialist [phone]

## 2020-04-04 NOTE — BH Assessment (Signed)
Clinician spoke to [place], NT that she will be ready to assess the pt in 10 minutes. NT to inform staff.     Redmond Pulling, MS, [facility], [facility] Triage Specialist [phone]
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
## 2020-04-04 NOTE — ED Notes (Signed)
Pt given meal tray.

## 2020-04-04 NOTE — ED Notes (Signed)
Gave pt phone to talk to mother

## 2020-04-04 NOTE — ED Provider Notes (Signed)
[facility] EMERGENCY DEPARTMENT Provider Note   CSN: [id number] Arrival date & time: 04/04/20  1559     History Chief Complaint  Patient presents with  . Psychiatric Evaluation    Marcia Hood is a 40 y.o. female.  HPI   40 year old female with a history of anxiety/depression, hypertension, migraines, polysubstance abuse, PTSD, who presents to the emergency department today for psychiatric evaluation. Patient states that she needs a "mental break ". She states that she is tired of people telling her what to do. She denies any SI, HI, AVH. She further reports that she wants to be checked out "from head to toe ". She is complaining of pain and a wound to the left hand where she accidentally stabbed herself with scissors. She has had no fevers. She denies other medical complaints at this time. She denies any recent drug or alcohol use but states she does have a history of this.  Past Medical History:  Diagnosis Date  . Anxiety   . Chronic back pain   . Depression   . Hypertension   . Migraine   . Polysubstance abuse (HCC)   . PTSD (post-traumatic stress disorder)     Patient Active Problem List   Diagnosis Date Noted  . Chronic hepatitis C (HCC) 02/06/2020  . Anxiety and depression 02/06/2020  . Memory loss 02/06/2020  . Tingling sensation 02/06/2020  . Positive hepatitis C antibody test 11/18/2019  . Left leg cellulitis 06/10/2016  . Nicotine dependence 06/10/2016  . Severe episode of recurrent major depressive disorder, without psychotic features (HCC)   . ADHD (attention deficit hyperactivity disorder) 06/06/2015  . PTSD (post-traumatic stress disorder) 06/06/2015  . Marijuana abuse 06/06/2015  . Supervision of other normal pregnancy 09/06/2012  . Maternal chronic hypertension 08/12/2012  . High-risk pregnancy 05/16/2012  . 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 CARPAL TUNNEL RELEASE    . CESAREAN  SECTION N/A 09/28/2012   Procedure: Primary cesarean section with delivery of baby girl at 801058. ;  Surgeon: Catalina AntiguaPeggy Constant, MD;  Location: [facility];  Service: Obstetrics;  Laterality: N/A;  . EYE SURGERY    . INCISION AND DRAINAGE ABSCESS Left 06/12/2016   Procedure: INCISION AND DRAINAGE OF ABSCESS, LEFT THIGH;  Surgeon: Franky MachoJenkins, Mark, MD;  Location: [facility];  Service: General;  Laterality: Left;  . TUBAL LIGATION       OB History    Gravida  3   Para  3   Term  3   Preterm  0   AB  0   Living  3     SAB  0   IAB  0   Ectopic  0   Multiple  0   Live Births  3           Family History  Problem Relation Age of Onset  . Hypertension Mother   . Diabetes Father   . Heart attack Maternal Grandmother   . Cancer Maternal Grandmother        Bladder  . Anesthesia problems Neg Hx   . Liver disease Neg Hx     Social History   Tobacco Use  . Smoking status: Former Smoker    Packs/day: 0.50    Years: 10.00    Pack years: 5.00    Types: Cigarettes    Quit date: 01/30/2018    Years since quitting: 2.1  . Smokeless tobacco: Never Used  . Tobacco comment: uses  vape now  Vaping Use  . Vaping Use: Every day  Substance Use Topics  . Alcohol use: No  . Drug use: Not Currently    Types: Benzodiazepines, Amphetamines    Home Medications Prior to Admission medications   Medication Sig Start Date End Date Taking? Authorizing Provider  amphetamine-dextroamphetamine (ADDERALL) 30 MG tablet Take 1 tablet by mouth daily. For ADHD Patient taking differently: Take 10-20 mg by mouth See admin instructions. 20mg  in AM and 10mg  in early afternoon when she remembers. At times will skip doses 06/10/15   I, NP  buPROPion (WELLBUTRIN XL) 150 MG 24 hr tablet Take 150 mg by mouth daily. 02/09/20   [provider]  celecoxib (CELEBREX) 100 MG capsule Take 1 capsule (100 mg total) by mouth 2 (two) times daily. 03/16/20   04/08/20, MD  ferrous sulfate 324 MG TBEC  Take 1 tablet by mouth daily. 09/25/19   [provider]  gabapentin (NEURONTIN) 300 MG capsule Take 2 capsules (600 mg total) by mouth 3 (three) times daily. For agitation Patient taking differently: Take 1,200 mg by mouth daily. 3-4 at bedtime as needed 06/10/15   09/27/19 I, NP  naproxen sodium (ALEVE) 220 MG tablet Take 220 mg by mouth.    [provider]  omeprazole (PRILOSEC) 40 MG capsule Take 80 mg by mouth daily. 03/02/20   [provider]  rOPINIRole (REQUIP) 1 MG tablet Take 1 tablet (1 mg total) by mouth at bedtime. For restless leg 06/10/15   04/30/20 I, NP  senna-docusate (SENOKOT-S) 8.6-50 MG tablet Take 2 tablets by mouth every evening. 03/02/20   [provider]  Sofosbuvir-Velpatasvir (EPCLUSA PO) Take 1 tablet by mouth daily. Once a day    [provider]  SUMAtriptan (IMITREX) 50 MG tablet Take 50 mg by mouth every 2 (two) hours as needed for migraine. 03/02/20   [provider]  traZODone (DESYREL) 150 MG tablet Take 150 mg by mouth at bedtime as needed for sleep.    [provider]  zolpidem (AMBIEN) 5 MG tablet Take 1 tablet (5 mg total) by mouth at bedtime as needed for sleep. Patient taking differently: Take 10 mg by mouth at bedtime as needed for sleep. 06/10/15   04/30/20, NP    Allergies    Penicillins  Review of Systems   Review of Systems  Constitutional: Negative for fever.  HENT: Negative for ear pain and sore throat.   Eyes: Negative for visual disturbance.  Respiratory: Negative for cough and shortness of breath.   Cardiovascular: Negative for chest pain.  Gastrointestinal: Negative for abdominal pain, constipation, diarrhea, nausea and vomiting.  Genitourinary: Negative for dysuria and hematuria.  Musculoskeletal: Negative for back pain.  Skin: Positive for wound.  Neurological: Negative for seizures and syncope.  Psychiatric/Behavioral: Negative for hallucinations and suicidal ideas.   All other systems reviewed and are negative.   Physical Exam Updated Vital Signs BP 134/85 (BP Location: Right Arm)   Pulse 98   Temp 98.2 F (36.8 C) (Oral)   Resp 18   Ht 5\' 4"  (1.626 m)   Wt 63.5 kg   SpO2 100%   BMI 24.03 kg/m   Physical Exam Vitals and nursing note reviewed.  Constitutional:      General: She is not in acute distress.    Appearance: She is well-developed and well-nourished.  HENT:     Head: Normocephalic and atraumatic.  Eyes:     Conjunctiva/sclera:  Conjunctivae normal.  Cardiovascular:     Rate and Rhythm: Normal rate and regular rhythm.     Heart sounds: No murmur heard.   Pulmonary:     Effort: Pulmonary effort is normal. No respiratory distress.     Breath sounds: Normal breath sounds.  Abdominal:     Palpations: Abdomen is soft.     Tenderness: There is no abdominal tenderness.  Musculoskeletal:        General: No edema.     Cervical back: Neck supple.  Skin:    General: Skin is warm and dry.     Comments: Scab noted to the left index finger with TTP noted. Mild associated swelling. No fluctuance noted.  Neurological:     Mental Status: She is alert.  Psychiatric:        Attention and Perception: She is inattentive.        Speech: Speech normal.        Thought Content: Thought content does not include homicidal or suicidal ideation. Thought content does not include homicidal or suicidal plan.        Judgment: Judgment is inappropriate.     Comments: bizarre affect     ED Results / Procedures / Treatments   Labs (all labs ordered are listed, but only abnormal results are displayed) Labs Reviewed  COMPREHENSIVE METABOLIC PANEL - Abnormal; Notable for the following components:      Result Value   Potassium 3.4 (*)    Glucose, Bld 69 (*)    All other components within normal limits  CBC WITH DIFFERENTIAL/PLATELET - Abnormal; Notable for the following components:   RBC 3.72 (*)    Hemoglobin 11.9 (*)    HCT 35.5 (*)    All  other components within normal limits  ACETAMINOPHEN LEVEL - Abnormal; Notable for the following components:   Acetaminophen (Tylenol), Serum <10 (*)    All other components within normal limits  SALICYLATE LEVEL - Abnormal; Notable for the following components:   Salicylate Lvl <7.0 (*)    All other components within normal limits  RAPID URINE DRUG SCREEN, HOSP PERFORMED - Abnormal; Notable for the following components:   Amphetamines POSITIVE (*)    Tetrahydrocannabinol POSITIVE (*)    All other components within normal limits  RESP PANEL BY RT-PCR (FLU A&B, COVID) ARPGX2  ETHANOL  POC URINE PREG, ED  CBG MONITORING, ED    EKG None  Radiology No results found.  Procedures Procedures   Medications Ordered in ED Medications  doxycycline (VIBRA-TABS) tablet 100 mg (has no administration in time range)  amphetamine-dextroamphetamine (ADDERALL) tablet 1 tablet (has no administration in time range)  buPROPion (WELLBUTRIN XL) 24 hr tablet 150 mg (has no administration in time range)  celecoxib (CELEBREX) capsule 100 mg (has no administration in time range)  gabapentin (NEURONTIN) capsule 600 mg (has no administration in time range)  pantoprazole (PROTONIX) EC tablet 40 mg (has no administration in time range)  rOPINIRole (REQUIP) tablet 1 mg (has no administration in time range)  senna-docusate (Senokot-S) tablet 2 tablet (has no administration in time range)  SUMAtriptan (IMITREX) tablet 50 mg (has no administration in time range)  traZODone (DESYREL) tablet 150 mg (has no administration in time range)  Tdap (BOOSTRIX) injection 0.5 mL (0.5 mLs Intramuscular Given 04/04/20 1807)    ED Course  I have reviewed the triage vital signs and the nursing notes.  Pertinent labs & imaging results that were available during my care of the patient were reviewed  by me and considered in my medical decision making (see chart for details).    MDM Rules/Calculators/A&P                           Patient here requesting psychiatric evaluation.  Denies SI, HI, AVH but does have bizarre affect during my eval.  Will get screening labs and consult TTS  Reviewed/interpreted labs CBC with mild anemia appears chronic in nature CMP with mild hypokalemia and hypoglycemia, otherwise reassuring UDS positive for amphetamines and THC EtOH, salicylate and acetaminophen levels neg Preg test negative Covid neg  Pt w/o emergent medical condition that would require further w/u or admission. She is appropriate for TTS eval.   TTS evaluated pt in the ED and recommends inpatient.   The patient has been placed in psychiatric observation due to the need to provide a safe environment for the patient while obtaining psychiatric consultation and evaluation, as well as ongoing medical and medication management to treat the patient's condition.  The patient has not been placed under full IVC at this time.   Final Clinical Impression(s) / ED Diagnoses Final diagnoses:  Encounter for psychological evaluation    Rx / DC Orders ED Discharge Orders    None       Rayne Du 04/04/20 2119    Vanetta Mulders, MD 04/08/20 [phone]

## 2020-04-04 NOTE — BH Assessment (Signed)
Disposition: Melbourne Abts, PA-C, Cecilio Asper, NP recommends inpatient treatment. Disposition [facility]. Disposition discussed with [place], PA and Dorathy Daft, Charity fundraiser.    Flowsheet Row ED from 04/04/2020 in [facility] EMERGENCY DEPARTMENT ED from 03/16/2020 in [facility] EMERGENCY DEPARTMENT  C-SSRS RISK CATEGORY No Risk No Risk       Redmond Pulling, MS, [facility] [place], [facility] Triage Specialist [phone]

## 2020-04-05 DIAGNOSIS — Z008 Encounter for other general examination: Secondary | ICD-10-CM

## 2020-04-05 NOTE — ED Provider Notes (Signed)
Emergency Medicine Observation Re-evaluation Note  Marcia Hood is a 40 y.o. female, seen on rounds today.  Pt initially presented to the ED for complaints of Psychiatric Evaluation Currently, the patient is resting comfortably.  Physical Exam  BP 119/73 (BP Location: Right Arm)   Pulse 66   Temp 97.9 F (36.6 C)   Resp 18   Ht 5\' 4"  (1.626 m)   Wt 63.5 kg   SpO2 100%   BMI 24.03 kg/m  Physical Exam General: NAD Cardiac: Regular rate Lungs: No respiratory distress Psych: Stable  ED Course / MDM  EKG:    I have reviewed the labs performed to date as well as medications administered while in observation.   Plan  Current plan is for inpatient psych placement Patient is not under full IVC at this time.   , MD 04/05/20 [phone]

## 2020-04-05 NOTE — ED Notes (Signed)
TTS at this time. 

## 2020-04-05 NOTE — ED Notes (Signed)
Pt verbalized she had Adderrall in her belongings. Nurse looked and unable to find any Adderall, only a small green container of extra pills (unknown) and trazodone. Pt agitated because she said she was certain the bottle was in the bag. Pt wanting to know when she will be transferred and not happy about her still being here.

## 2020-04-05 NOTE — ED Notes (Signed)
Pt is up for discharge.

## 2020-04-05 NOTE — ED Provider Notes (Signed)
Patient was evaluated by TTS Ms. Gerilyn Pilgrim.  She has been cleared psychiatrically.  They [facility]   Terrilee Files, MD 04/05/20 [phone]

## 2020-04-05 NOTE — Discharge Instructions (Signed)
[facility]  Return to the emergency department for any worsening or concerning symptoms.
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
## 2020-04-05 NOTE — Progress Notes (Addendum)
Pt. meets criteria for inpatient treatment per [place] Abts, NP. Referred out to the following hospitals:   [facility] [place]  Disposition CSW will continue to follow for placement.  Signed:  Corky Crafts, MSW, [place], LCASA 04/05/2020 2:41 PM    ADDENDUM [facility] reports [facility] are [facility] at capacity. Recommended [facility] for placement. CSW left voicemail with [facility] [place] with contact information and callback request.   Signed:  Corky Crafts, MSW, LCSWA, LCASA 04/05/2020 3:03 PM

## 2020-05-12 ENCOUNTER — Encounter: Payer: Self-pay | Admitting: Gastroenterology

## 2020-06-15 ENCOUNTER — Ambulatory Visit: Payer: Self-pay | Admitting: Nurse Practitioner

## 2020-06-16 ENCOUNTER — Ambulatory Visit (INDEPENDENT_AMBULATORY_CARE_PROVIDER_SITE_OTHER): Payer: No Typology Code available for payment source | Admitting: Gastroenterology

## 2020-06-16 ENCOUNTER — Encounter: Payer: Self-pay | Admitting: Gastroenterology

## 2020-06-16 ENCOUNTER — Other Ambulatory Visit: Payer: Self-pay

## 2020-06-16 VITALS — BP 125/75 | HR 62 | Temp 97.3°F | Ht 64.0 in | Wt 139.8 lb

## 2020-06-16 DIAGNOSIS — M5410 Radiculopathy, site unspecified: Secondary | ICD-10-CM

## 2020-06-16 DIAGNOSIS — K5909 Other constipation: Secondary | ICD-10-CM

## 2020-06-16 DIAGNOSIS — B182 Chronic viral hepatitis C: Secondary | ICD-10-CM | POA: Diagnosis not present

## 2020-06-16 NOTE — Assessment & Plan Note (Signed)
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  HCVRNA not detected after completion of Epclusa. LFTs returned to normal. She is due for 41-month HCVRNA to confirm SVR which we have ordered today.

## 2020-06-16 NOTE — Progress Notes (Signed)
Referring Provider: [facility] Primary Care Physician:  [facility] Primary GI Physician: Dr. Marletta Lor  Chief Complaint  Patient presents with  . Nausea  . radicular pain    Down her right arm and leg    HPI:   Marcia Hood is a 40 y.o. female presenting today for follow-up.  History of chronic hep C, PTSD, memory loss, remote history of IV drug use, elevated LFTs. Hepatitis C work-up completed which found mild transaminitis, normal platelets/INR.  Hepatitis A antibody positive, hepatitis B serologies consistent with immunity, HIV negative, confirmed hepatitis C infection with RNA at 23,500 copies, genotype Ia.  Fibrosure consistent with no fibrosis. She is now s/p treatment with Epclusa.   Last seen 03/18/20. She was doing well. Nearing completion of Hep C. Plans to update labs and collow up in 3 months for last visit and SVR labs.   Labs completed 03/18/2020: CBC within normal limits, CMP essentially normal aside from potassium elevated at 5.3.  HCVRNA negative.  Today:  Her primary concern today in right-sided pain.  States she has pain radiating from her neck down her right arm and also from her hip down her right leg.  She feels she is being shocked or tased.  Worsened with certain movements.  Can also have a dull/numbing feeling.  Hardly ever stops and started a few months ago.  Also with chronic lower back pain/trouble with disc.  Receives injections in her back.  Sees a pain clinic.  She has not discussed her pain with her primary care provider at the [place].  Due to ongoing pain, she occasionally feels nauseated, but no vomiting.  Nausea is not triggered by meals.  She eats well.  No abdominal pain.  Denies GERD or dysphagia.  Chronic history of constipation with bowel movements a couple times a week taking Senokot nightly.  Denies bright red blood per rectum or melena.  She is interested in trying Linzess, but is worried about having urgency or  diarrhea.   Past Medical History:  Diagnosis Date  . Anxiety   . Chronic back pain   . Depression   . Hepatitis C    Without fibrosis.  Completed Epclusa February 2022.  Marland Kitchen Hypertension   . Migraine   . Polysubstance abuse (HCC)   . PTSD (post-traumatic stress disorder)     Past Surgical History:  Procedure Laterality Date  . CARPAL TUNNEL RELEASE    . CESAREAN SECTION N/A 09/28/2012   Procedure: Primary cesarean section with delivery of baby girl at 46. ;  Surgeon: Catalina Antigua, MD;  Location: [facility];  Service: Obstetrics;  Laterality: N/A;  . EYE SURGERY    . INCISION AND DRAINAGE ABSCESS Left 06/12/2016   Procedure: INCISION AND DRAINAGE OF ABSCESS, LEFT THIGH;  Surgeon: Franky Macho, MD;  Location: [facility];  Service: General;  Laterality: Left;  . TUBAL LIGATION      Current Outpatient Medications  Medication Sig Dispense Refill  . amphetamine-dextroamphetamine (ADDERALL XR) 5 MG 24 hr capsule Take 30 mg by mouth daily. 20 mg in the morning and 10 mg in the afternoon    . buPROPion (WELLBUTRIN XL) 150 MG 24 hr tablet Take 150 mg by mouth daily.    . cetirizine (ZYRTEC) 10 MG tablet Take 10 mg by mouth as needed. Take as needed for allergies    . gabapentin (NEURONTIN) 300 MG capsule Take 2 capsules (600 mg total) by mouth 3 (three) times daily. For  agitation (Patient taking differently: Take 1,200 mg by mouth daily. 3-4 at bedtime as needed) 180 capsule 0  . naproxen sodium (ALEVE) 220 MG tablet Take 220 mg by mouth as needed.    Marland Kitchen omeprazole (PRILOSEC) 20 MG capsule Take 40 mg by mouth daily.    Marland Kitchen rOPINIRole (REQUIP) 1 MG tablet Take 1 tablet (1 mg total) by mouth at bedtime. For restless leg    . SUMAtriptan (IMITREX) 50 MG tablet Take 50 mg by mouth every 2 (two) hours as needed for migraine.    . traZODone (DESYREL) 150 MG tablet Take 150 mg by mouth at bedtime as needed for sleep.    Marland Kitchen zolpidem (AMBIEN) 5 MG tablet Take 1 tablet (5 mg total) by mouth at bedtime as  needed for sleep. (Patient taking differently: Take 10 mg by mouth at bedtime as needed for sleep.) 7 tablet 0   No current facility-administered medications for this visit.    Allergies as of 06/16/2020 - Review Complete 06/16/2020  Allergen Reaction Noted  . Penicillins Rash 05/10/2011    Family History  Problem Relation Age of Onset  . Hypertension Mother   . Diabetes Father   . Heart attack Maternal Grandmother   . Cancer Maternal Grandmother        Bladder  . Anesthesia problems Neg Hx   . Liver disease Neg Hx     Social History   Socioeconomic History  . Marital status: Legally Separated    Spouse name: Not on file  . Number of children: Not on file  . Years of education: Not on file  . Highest education level: Not on file  Occupational History  . Not on file  Tobacco Use  . Smoking status: Former Smoker    Packs/day: 0.50    Years: 10.00    Pack years: 5.00    Types: Cigarettes    Quit date: 01/30/2018    Years since quitting: 2.3  . Smokeless tobacco: Never Used  . Tobacco comment: uses vape now  Vaping Use  . Vaping Use: Every day  Substance and Sexual Activity  . Alcohol use: No  . Drug use: Not Currently    Types: Benzodiazepines, Amphetamines  . Sexual activity: Yes    Birth control/protection: Surgical  Other Topics Concern  . Not on file  Social History Narrative  . Not on file   Social Determinants of Health   Financial Resource Strain: Not on file  Food Insecurity: Not on file  Transportation Needs: Not on file  Physical Activity: Not on file  Stress: Not on file  Social Connections: Not on file    Review of Systems: Gen: Denies fever, chills, cold or flulike symptoms, presyncope, syncope. CV: Denies chest pain or palpitations. Resp: Denies dyspnea or cough. GI: See HPI Heme: See HPI  Physical Exam: BP 125/75   Pulse 62   Temp (!) 97.3 F (36.3 C) (Temporal)   Ht 5\' 4"  (1.626 m)   Wt 139 lb 12.8 oz (63.4 kg)   LMP 05/28/2020  (Approximate)   BMI 24.00 kg/m  General:   Alert and oriented. No distress noted. Pleasant and cooperative.  Head:  Normocephalic and atraumatic. Eyes:  Conjuctiva clear without scleral icterus. Heart:  S1, S2 present without murmurs appreciated. Lungs:  Clear to auscultation bilaterally. No wheezes, rales, or rhonchi. No distress.  Abdomen:  +BS, soft, non-tender and non-distended. No rebound or guarding. No HSM or masses noted. Msk:  Symmetrical without gross deformities. Normal  posture. Extremities:  Without edema. Neurologic:  Alert and  oriented x4 Psych:  Normal mood and affect.  Tearful at times.

## 2020-06-16 NOTE — Assessment & Plan Note (Signed)
Chronic constipation not adequately managed on Senokot daily.  No alarm symptoms.  We will try her on Linzess 72 mcg daily.  Samples provided.  Requested progress report in 1-2 weeks.  Follow-up in 4 months or sooner if needed.

## 2020-06-16 NOTE — Patient Instructions (Signed)
Please have blood work completed at [facility].  For constipation: Stop Senokot and try Linzess 72 mcg daily 30 minutes before your first meal.  We are providing samples for you today.  Call with a progress report in 1-2 weeks.  This is working well, I will send a prescription for you.  If it is not strong enough, we can increase the dose. As we discussed, you may have diarrhea for the first couple weeks, but this should resolve.  [facility] the [facility] are having [facility] I suspect this is coming from your back.   We will see you back in 4 months. Do not hesitate to call with questions or concerns prior.   Ermalinda Memos, PA-C [facility]

## 2020-06-16 NOTE — Assessment & Plan Note (Addendum)
Patient is tearful today describing right-sided radicular pain coming from her neck radiating down her arm and also radiating from her right hip down her leg.  States she feels she is being shocked or tased.  Also reports chronic back pain following with a pain clinic and receiving injections.  She has not discussed her current symptoms with pain clinic for her primary care provider at the [place].   I suspect she is experiencing radicular pain from her cervical spine and possibly lumbar spine.  Encouraged her to reach out to the [place] to schedule a follow-up appointment for further evaluation.

## 2020-06-18 ENCOUNTER — Encounter (HOSPITAL_COMMUNITY): Payer: Self-pay | Admitting: *Deleted

## 2020-06-18 ENCOUNTER — Other Ambulatory Visit: Payer: Self-pay

## 2020-06-18 ENCOUNTER — Emergency Department (HOSPITAL_COMMUNITY): Payer: No Typology Code available for payment source

## 2020-06-18 ENCOUNTER — Emergency Department (HOSPITAL_COMMUNITY)
Admission: EM | Admit: 2020-06-18 | Discharge: 2020-06-18 | Disposition: A | Discharge status: Home / Self Care | Payer: No Typology Code available for payment source | Attending: Emergency Medicine | Admitting: Emergency Medicine

## 2020-06-18 DIAGNOSIS — I10 Essential (primary) hypertension: Secondary | ICD-10-CM | POA: Insufficient documentation

## 2020-06-18 DIAGNOSIS — Z79899 Other long term (current) drug therapy: Secondary | ICD-10-CM | POA: Insufficient documentation

## 2020-06-18 DIAGNOSIS — R200 Anesthesia of skin: Secondary | ICD-10-CM | POA: Diagnosis present

## 2020-06-18 DIAGNOSIS — M5410 Radiculopathy, site unspecified: Secondary | ICD-10-CM | POA: Insufficient documentation

## 2020-06-18 DIAGNOSIS — Z87891 Personal history of nicotine dependence: Secondary | ICD-10-CM | POA: Insufficient documentation

## 2020-06-18 LAB — HCV RNA QUANT: Hepatitis C Quantitation: NOT DETECTED IU/mL

## 2020-06-18 MED ORDER — PREDNISONE 50 MG PO TABS
60.0000 mg | ORAL_TABLET | Freq: Once | ORAL | Status: AC
  Administered 2020-06-18: 60 mg via ORAL
  Filled 2020-06-18: qty 1

## 2020-06-18 MED ORDER — PREDNISONE 10 MG PO TABS: ORAL_TABLET | ORAL | 0 refills | Status: DC

## 2020-06-18 MED ORDER — CYCLOBENZAPRINE HCL 5 MG PO TABS: 5.0000 mg | ORAL_TABLET | Freq: Three times a day (TID) | ORAL | 0 refills | Status: AC | PRN

## 2020-06-18 MED ORDER — CYCLOBENZAPRINE HCL 10 MG PO TABS
10.0000 mg | ORAL_TABLET | Freq: Once | ORAL | Status: AC
  Administered 2020-06-18: 10 mg via ORAL
  Filled 2020-06-18: qty 1

## 2020-06-18 NOTE — ED Notes (Signed)
PA wanted wrist splint with no thumb spica-none available so vo to use ACE wrap. Pt r wrist wraped with ACE wrap.

## 2020-06-18 NOTE — ED Triage Notes (Signed)
C/o tingling in both hands onset 2 weeks ago after hitting a steering wheel. Right hand tingles more than left

## 2020-06-18 NOTE — Discharge Instructions (Addendum)
As discussed,  your exam and symptoms suggests a "pinched nerve" causing the symptoms in your right arm wrist and hand.  The distribution of symptoms suggests your ulnar nerve is the culprit since it affects your 5th finger the most, but may also be due to higher irritation (from a muscle spasm in your or shoulder, or from a cervical disk issue in your neck).  Try the muscle relaxer and prednisone prescribed.  You may stay active with your hand as tolerated, but avoid those activities that worsens your symptoms.  Wearing the wrist splint may help heal the problem - if you find it gets in your way for work, at least wear it when home and overnight when sleeping.  Application of heat to the sore muscle in your right neck may also help relieve your symptoms - 20 minutes several times daily.  You may need further testing such as an MRI of your cervical spine which will need to be determined by your specialist at the [facility] or the neurologist we have referred you to here locally.    Get rechecked urgently if you develop weakness in the arm or hand as this would suggest  a worsening condition.  Your xray is negative for acute injury.

## 2020-06-18 NOTE — ED Notes (Signed)
See triage notes. States her hands are swollen. No obvious swelling noted. Radial pulses wnl. C/o feet swelling as well-no obvious swelling noted. Pt tearful upon entrance. Pt states she is hurting and annoyed that she is hurting.  Pt comforted. Will advise PA.

## 2020-06-21 ENCOUNTER — Ambulatory Visit: Payer: Self-pay | Admitting: Gastroenterology

## 2020-06-21 NOTE — ED Provider Notes (Signed)
[facility] EMERGENCY DEPARTMENT Provider Note   CSN: [id number] Arrival date & time: 06/18/20  1450     History No chief complaint on file.   Marcia OatsCarolyn Hood is a 40 y.o. female with a history most significant for chronic back pain and history of chronic radiculopathy, predominantly in her right extremities, surgical history of bilateral carpal tunnel release [facility] [place] presenting with increased numbness and tingling sensation in her bilateral hands, R>L over the past 2 weeks, localizing into her 5th fingers.  She denies weakness in her arm or hand, denies any new injury, but has had some soreness across her right neck and shoulder muscles. Works in a job with somewhat repetitive movements, but no heavy lifting. Right handed.  No fevers, chills.  Has found no alleviators.      The history is provided by the patient.       Past Medical History:  Diagnosis Date  . Anxiety   . Chronic back pain   . Depression   . Hepatitis C    Without fibrosis.  Completed Epclusa February 2022.  Marland Kitchen. Hypertension   . Migraine   . Polysubstance abuse (HCC)   . PTSD (post-traumatic stress disorder)     Patient Active Problem List   Diagnosis Date Noted  . Radicular pain 06/16/2020  . Chronic constipation 06/16/2020  . Encounter for psychological evaluation   . Chronic hepatitis C without hepatic coma (HCC) 02/06/2020  . Anxiety and depression 02/06/2020  . Memory loss 02/06/2020  . Tingling sensation 02/06/2020  . Positive hepatitis C antibody test 11/18/2019  . Left leg cellulitis 06/10/2016  . Nicotine dependence 06/10/2016  . Severe episode of recurrent major depressive disorder, without psychotic features (HCC)   . ADHD (attention deficit hyperactivity disorder) 06/06/2015  . PTSD (post-traumatic stress disorder) 06/06/2015  . Marijuana abuse 06/06/2015  . Supervision of other normal pregnancy 09/06/2012  . Maternal chronic hypertension 08/12/2012  . High-risk pregnancy 05/16/2012   . 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 CARPAL TUNNEL RELEASE    . CESAREAN SECTION N/A 09/28/2012   Procedure: Primary cesarean section with delivery of baby girl at [id number]. ;  Surgeon: Catalina AntiguaPeggy Constant, MD;  Location: [facility];  Service: Obstetrics;  Laterality: N/A;  . EYE SURGERY    . INCISION AND DRAINAGE ABSCESS Left 06/12/2016   Procedure: INCISION AND DRAINAGE OF ABSCESS, LEFT THIGH;  Surgeon: Franky MachoJenkins, Mark, MD;  Location: [facility];  Service: General;  Laterality: Left;  . TUBAL LIGATION       OB History    Gravida  3   Para  3   Term  3   Preterm  0   AB  0   Living  3     SAB  0   IAB  0   Ectopic  0   Multiple  0   Live Births  3           Family History  Problem Relation Age of Onset  . Hypertension Mother   . Diabetes Father   . Heart attack Maternal Grandmother   . Cancer Maternal Grandmother        Bladder  . Anesthesia problems Neg Hx   . Liver disease Neg Hx     Social History   Tobacco Use  . Smoking status: Former Smoker    Packs/day: 0.50    Years: 10.00    Pack years: 5.00    Types:  Cigarettes    Quit date: 01/30/2018    Years since quitting: 2.3  . Smokeless tobacco: Never Used  . Tobacco comment: uses vape now  Vaping Use  . Vaping Use: Every day  Substance Use Topics  . Alcohol use: No  . Drug use: Not Currently    Types: Benzodiazepines, Amphetamines    Home Medications Prior to Admission medications   Medication Sig Start Date End Date Taking? Authorizing Provider  cyclobenzaprine (FLEXERIL) 5 MG tablet Take 1 tablet (5 mg total) by mouth 3 (three) times daily as needed for muscle spasms. 06/18/20  Yes Felton Buczynski, Raynelle Fanning, PA-C  amphetamine-dextroamphetamine (ADDERALL XR) 5 MG 24 hr capsule Take 30 mg by mouth daily. 20 mg in the morning and 10 mg in the afternoon 03/23/20   [provider]  buPROPion (WELLBUTRIN XL) 150 MG 24 hr tablet Take 150 mg by mouth  daily. 02/09/20   [provider]  cetirizine (ZYRTEC) 10 MG tablet Take 10 mg by mouth as needed. Take as needed for allergies    [provider]  gabapentin (NEURONTIN) 300 MG capsule Take 2 capsules (600 mg total) by mouth 3 (three) times daily. For agitation Patient taking differently: Take 1,200 mg by mouth daily. 3-4 at bedtime as needed 06/10/15   Armandina Stammer I, NP  naproxen sodium (ALEVE) 220 MG tablet Take 220 mg by mouth as needed.    [provider]  omeprazole (PRILOSEC) 20 MG capsule Take 40 mg by mouth daily.    [provider]  predniSONE (DELTASONE) 10 MG tablet 6, 5, 4, 3, 2 then 1 tablet by mouth daily for 6 days total. 06/18/20   Nassir Neidert, Raynelle Fanning, PA-C  rOPINIRole (REQUIP) 1 MG tablet Take 1 tablet (1 mg total) by mouth at bedtime. For restless leg 06/10/15   Armandina Stammer I, NP  SUMAtriptan (IMITREX) 50 MG tablet Take 50 mg by mouth every 2 (two) hours as needed for migraine. 03/02/20   [provider]  traZODone (DESYREL) 150 MG tablet Take 150 mg by mouth at bedtime as needed for sleep.    [provider]  zolpidem (AMBIEN) 5 MG tablet Take 1 tablet (5 mg total) by mouth at bedtime as needed for sleep. Patient taking differently: Take 10 mg by mouth at bedtime as needed for sleep. 06/10/15   Armandina Stammer I, NP    Allergies    Penicillins  Review of Systems   Review of Systems  Constitutional: Negative for chills and fever.  HENT: Negative.   Eyes: Negative.   Genitourinary: Negative.   Musculoskeletal: Positive for myalgias. Negative for arthralgias and joint swelling.  Skin: Negative.  Negative for rash and wound.  Neurological: Positive for numbness. Negative for dizziness, weakness, light-headedness and headaches.  All other systems reviewed and are negative.   Physical Exam Updated Vital Signs BP 122/79   Pulse 66   Temp 97.9 F (36.6 C) (Oral)   Resp 17   LMP 05/28/2020 (Approximate)   SpO2 100%   Physical  Exam Vitals and nursing note reviewed.  Constitutional:      Appearance: She is well-developed.  HENT:     Head: Normocephalic and atraumatic.  Eyes:     Extraocular Movements: Extraocular movements intact.  Neck:     Comments: no midline tenderness.  Right paracervical/trapezius soreness, decreased lateral ROM cervical.  Cardiovascular:     Rate and Rhythm: Normal rate.  Pulmonary:     Effort: Pulmonary effort is normal.  Musculoskeletal:  General: No swelling or deformity. Normal range of motion.     Cervical back: Normal range of motion and neck supple.  Lymphadenopathy:     Cervical: No cervical adenopathy.  Skin:    General: Skin is warm and dry.     Findings: No rash.  Neurological:     General: No focal deficit present.     Mental Status: She is alert and oriented to person, place, and time.     GCS: GCS eye subscore is 4. GCS verbal subscore is 5. GCS motor subscore is 6.     Sensory: No sensory deficit.     Gait: Gait normal.     Deep Tendon Reflexes:     Reflex Scores:      Bicep reflexes are 2+ on the right side and 2+ on the left side.    Comments:  normal rapid alternating movements. Cranial nerves III-XII intact. Equal grip strength.  Normal strength fingers and wrists.  Negative Tinel's.   Psychiatric:        Speech: Speech normal.        Behavior: Behavior normal.        Thought Content: Thought content normal.     ED Results / Procedures / Treatments   Labs (all labs ordered are listed, but only abnormal results are displayed) Labs Reviewed - No data to display  EKG None  Radiology No results found.  DG Hand Complete Right  Result Date: 06/18/2020 CLINICAL DATA:  Bilateral hand tingling x2 weeks. EXAM: RIGHT HAND - COMPLETE 3+ VIEW COMPARISON:  March 02, 2013 FINDINGS: There is no evidence of an acute fracture or dislocation. A chronic fracture of the fifth right metacarpal is seen. There is no evidence of arthropathy or other focal bone  abnormality. Soft tissues are unremarkable. IMPRESSION: No acute osseous abnormality. Electronically Signed   By: Aram Candela M.D.   On: 06/18/2020 17:21    Procedures Procedures   Medications Ordered in ED Medications  cyclobenzaprine (FLEXERIL) tablet 10 mg (10 mg Oral Given 06/18/20 1723)  predniSONE (DELTASONE) tablet 60 mg (60 mg Oral Given 06/18/20 1723)    ED Course  I have reviewed the triage vital signs and the nursing notes.  Pertinent labs & imaging results that were available during my care of the patient were reviewed by me and considered in my medical decision making (see chart for details).    MDM Rules/Calculators/A&P                          Pt with bilateral upper extremity tingling, R>L, right trapezius tenderness/spasm.  Plain imaging negative for acute c spine findings, exam reassuring with normal dtr's and strength/function.  Prednisone and flexeril recommended, heat tx followed by ROM stretching.  Plan f/u with pcp for any persistent or worsened sx, return precautions advised.    Pt may need further imaging if sx persist.  Pt expressed [facility] but can see local referrals - consider Dr. Gerilyn Pilgrim for further neuro eval if sx warrant, referral given.  The patient appears reasonably screened and/or stabilized for discharge and I doubt any other medical condition or other [facility] requiring further screening, evaluation, or treatment in the ED at this time prior to discharge.  Final Clinical Impression(s) / ED Diagnoses Final diagnoses:  Radiculopathy affecting upper extremity    Rx / DC Orders ED Discharge Orders  Ordered    predniSONE (DELTASONE) 10 MG tablet  Status:  Discontinued        06/18/20 1742    cyclobenzaprine (FLEXERIL) 5 MG tablet  3 times daily PRN        06/18/20 1742    predniSONE (DELTASONE) 10 MG tablet        06/18/20 1744           Burgess Amor, PA-C 06/21/20 1602     Bethann Berkshire, MD 06/22/20 [phone]

## 2020-10-23 NOTE — Progress Notes (Deleted)
Referring Provider: [facility] Primary Care Physician:  [facility] Primary GI Physician: Dr. Marletta Lor  No chief complaint on file.   HPI:   Marcia Hood is a 40 y.o. female presenting today with a history of PTSD, memory loss, remote history of IV drug use, hepatitis C, genotype Ia without fibrosis s/p 12-week course of Epclusa completed February 2022 achieving SVR, previously with mild transaminitis resolved with hep C treatment, and chronic constipation presenting today for 4 month follow-up on constipation.  Last seen in our office 06/16/2020 for the same.  She was provided samples up Linzess 72 mcg with request for progress report in 1-2 weeks.  No progress report received.  Today:   Past Medical History:  Diagnosis Date   Anxiety    Chronic back pain    Depression    Hepatitis C    Without fibrosis.  Completed Epclusa February 2022.   Hypertension    Migraine    Polysubstance abuse (HCC)    PTSD (post-traumatic stress disorder)     Past Surgical History:  Procedure Laterality Date   CARPAL TUNNEL RELEASE     CESAREAN SECTION N/A 09/28/2012   Procedure: Primary cesarean section with delivery of baby girl at 6. ;  Surgeon: Catalina Antigua, MD;  Location: [facility];  Service: Obstetrics;  Laterality: N/A;   EYE SURGERY     INCISION AND DRAINAGE ABSCESS Left 06/12/2016   Procedure: INCISION AND DRAINAGE OF ABSCESS, LEFT THIGH;  Surgeon: Franky Macho, MD;  Location: [facility];  Service: General;  Laterality: Left;   TUBAL LIGATION      Current Outpatient Medications  Medication Sig Dispense Refill   amphetamine-dextroamphetamine (ADDERALL XR) 5 MG 24 hr capsule Take 30 mg by mouth daily. 20 mg in the morning and 10 mg in the afternoon     buPROPion (WELLBUTRIN XL) 150 MG 24 hr tablet Take 150 mg by mouth daily.     cetirizine (ZYRTEC) 10 MG tablet Take 10 mg by mouth as needed. Take as needed for allergies     cyclobenzaprine (FLEXERIL) 5 MG  tablet Take 1 tablet (5 mg total) by mouth 3 (three) times daily as needed for muscle spasms. 21 tablet 0   gabapentin (NEURONTIN) 300 MG capsule Take 2 capsules (600 mg total) by mouth 3 (three) times daily. For agitation (Patient taking differently: Take 1,200 mg by mouth daily. 3-4 at bedtime as needed) 180 capsule 0   naproxen sodium (ALEVE) 220 MG tablet Take 220 mg by mouth as needed.     omeprazole (PRILOSEC) 20 MG capsule Take 40 mg by mouth daily.     predniSONE (DELTASONE) 10 MG tablet 6, 5, 4, 3, 2 then 1 tablet by mouth daily for 6 days total. 21 tablet 0   rOPINIRole (REQUIP) 1 MG tablet Take 1 tablet (1 mg total) by mouth at bedtime. For restless leg     SUMAtriptan (IMITREX) 50 MG tablet Take 50 mg by mouth every 2 (two) hours as needed for migraine.     traZODone (DESYREL) 150 MG tablet Take 150 mg by mouth at bedtime as needed for sleep.     zolpidem (AMBIEN) 5 MG tablet Take 1 tablet (5 mg total) by mouth at bedtime as needed for sleep. (Patient taking differently: Take 10 mg by mouth at bedtime as needed for sleep.) 7 tablet 0   No current facility-administered medications for this visit.    Allergies as of 10/25/2020 - Review  Complete 06/18/2020  Allergen Reaction Noted   Penicillins Rash 05/10/2011    Family History  Problem Relation Age of Onset   Hypertension Mother    Diabetes Father    Heart attack Maternal Grandmother    Cancer Maternal Grandmother        Bladder   Anesthesia problems Neg Hx    Liver disease Neg Hx     Social History   Socioeconomic History   Marital status: Single    Spouse name: Not on file   Number of children: Not on file   Years of education: Not on file   Highest education level: Not on file  Occupational History   Not on file  Tobacco Use   Smoking status: Former    Packs/day: 0.50    Years: 10.00    Pack years: 5.00    Types: Cigarettes    Quit date: 01/30/2018    Years since quitting: 2.7   Smokeless tobacco: Never    Tobacco comments:    uses vape now  Vaping Use   Vaping Use: Every day  Substance and Sexual Activity   Alcohol use: No   Drug use: Not Currently    Types: Benzodiazepines, Amphetamines   Sexual activity: Yes    Birth control/protection: Surgical  Other Topics Concern   Not on file  Social History Narrative   Not on file   Social Determinants of Health   Financial Resource Strain: Not on file  Food Insecurity: Not on file  Transportation Needs: Not on file  Physical Activity: Not on file  Stress: Not on file  Social Connections: Not on file    Review of Systems: Gen: Denies fever, chills, anorexia. Denies fatigue, weakness, weight loss.  CV: Denies chest pain, palpitations, syncope, peripheral edema, and claudication. Resp: Denies dyspnea at rest, cough, wheezing, coughing up blood, and pleurisy. GI: Denies vomiting blood, jaundice, and fecal incontinence.   Denies dysphagia or odynophagia. Derm: Denies rash, itching, dry skin Psych: Denies depression, anxiety, memory loss, confusion. No homicidal or suicidal ideation.  Heme: Denies bruising, bleeding, and enlarged lymph nodes.  Physical Exam: There were no vitals taken for this visit. General:   Alert and oriented. No distress noted. Pleasant and cooperative.  Head:  Normocephalic and atraumatic. Eyes:  Conjuctiva clear without scleral icterus. Mouth:  Oral mucosa pink and moist. Good dentition. No lesions. Heart:  S1, S2 present without murmurs appreciated. Lungs:  Clear to auscultation bilaterally. No wheezes, rales, or rhonchi. No distress.  Abdomen:  +BS, soft, non-tender and non-distended. No rebound or guarding. No HSM or masses noted. Msk:  Symmetrical without gross deformities. Normal posture. Extremities:  Without edema. Neurologic:  Alert and  oriented x4 Psych:  Alert and cooperative. Normal mood and affect.

## 2020-10-25 ENCOUNTER — Ambulatory Visit: Payer: No Typology Code available for payment source | Admitting: Gastroenterology

## 2020-12-14 ENCOUNTER — Other Ambulatory Visit: Payer: Self-pay

## 2020-12-14 ENCOUNTER — Ambulatory Visit
Admission: EM | Admit: 2020-12-14 | Discharge: 2020-12-14 | Disposition: A | Discharge status: Home / Self Care | Payer: No Typology Code available for payment source | Attending: Family Medicine | Admitting: Family Medicine

## 2020-12-14 DIAGNOSIS — K047 Periapical abscess without sinus: Secondary | ICD-10-CM | POA: Diagnosis not present

## 2020-12-14 MED ORDER — CLINDAMYCIN HCL 300 MG PO CAPS: 300.0000 mg | ORAL_CAPSULE | Freq: Two times a day (BID) | ORAL | 0 refills | Status: DC

## 2020-12-14 MED ORDER — LIDOCAINE VISCOUS HCL 2 % MT SOLN: 5.0000 mL | OROMUCOSAL | 0 refills | Status: DC | PRN

## 2020-12-14 NOTE — ED Triage Notes (Signed)
Pt presents with right lower dental abscess for past week

## 2020-12-14 NOTE — ED Provider Notes (Signed)
[facility]    CSN: [id number] Arrival date & time: 12/14/20  1545      History   Chief Complaint Chief Complaint  Patient presents with   Abscess   Dental Pain    HPI Marcia OatsCarolyn Hood is a 40 y.o. female.   Presenting today with 1 week history of acutely worsening right lower molar dental pain, swelling of the face in this area.  Denies drainage, fevers, dysphagia, nausea, vomiting.  Using salt water gargles, turmeric, over-the-counter pain relievers with minimal relief.  States she has a dental appointment but is not until 10 days from now.  Has had this issue before but not this bad.   Past Medical History:  Diagnosis Date   Anxiety    Chronic back pain    Depression    Hepatitis C    Without fibrosis.  Completed Epclusa February 2022.   Hypertension    Migraine    Polysubstance abuse (HCC)    PTSD (post-traumatic stress disorder)     Patient Active Problem List   Diagnosis Date Noted   Radicular pain 06/16/2020   Chronic constipation 06/16/2020   Encounter for psychological evaluation    Chronic hepatitis C without hepatic coma (HCC) 02/06/2020   Anxiety and depression 02/06/2020   Memory loss 02/06/2020   Tingling sensation 02/06/2020   Positive hepatitis C antibody test 11/18/2019   Left leg cellulitis 06/10/2016   Nicotine dependence 06/10/2016   Severe episode of recurrent major depressive disorder, without psychotic features (HCC)    ADHD (attention deficit hyperactivity disorder) 06/06/2015   PTSD (post-traumatic stress disorder) 06/06/2015   Marijuana abuse 06/06/2015   Supervision of other normal pregnancy 09/06/2012   Maternal chronic hypertension 08/12/2012   High-risk pregnancy 05/16/2012   Drug dependence, antepartum(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 ;   Surgeon: Catalina AntiguaPeggy Constant, MD;  Location: [facility];  Service: Obstetrics;  Laterality: N/A;   EYE SURGERY     INCISION AND DRAINAGE ABSCESS Left 06/12/2016   Procedure: INCISION AND DRAINAGE OF ABSCESS, LEFT THIGH;  Surgeon: Franky MachoJenkins, Mark, MD;  Location: [facility];  Service: General;  Laterality: Left;   TUBAL LIGATION      OB History     Gravida  3   Para  3   Term  3   Preterm  0   AB  0   Living  3      SAB  0   IAB  0   Ectopic  0   Multiple  0   Live Births  3            Home Medications    Prior to Admission medications   Medication Sig Start Date End Date Taking? Authorizing Provider  clindamycin (CLEOCIN) 300 MG capsule Take 1 capsule (300 mg total) by mouth 2 (two) times daily. 12/14/20  Yes Particia NearingLane, Arhan Mcmanamon Elizabeth, PA-C  lidocaine (XYLOCAINE) 2 % solution Use as directed 5 mLs in the mouth or throat every 3 (three) hours as needed for mouth pain. 12/14/20  Yes Particia NearingLane, Anette Barra Elizabeth, PA-C  amphetamine-dextroamphetamine (ADDERALL XR) 5 MG 24 hr capsule Take 30 mg by mouth daily. 20 mg in the morning and 10 mg in the afternoon 03/23/20   [provider]  buPROPion (WELLBUTRIN XL) 150 MG 24 hr tablet Take  150 mg by mouth daily. 02/09/20   [provider]  cetirizine (ZYRTEC) 10 MG tablet Take 10 mg by mouth as needed. Take as needed for allergies    [provider]  cyclobenzaprine (FLEXERIL) 5 MG tablet Take 1 tablet (5 mg total) by mouth 3 (three) times daily as needed for muscle spasms. 06/18/20   Evalee Jefferson, PA-C  gabapentin (NEURONTIN) 300 MG capsule Take 2 capsules (600 mg total) by mouth 3 (three) times daily. For agitation Patient taking differently: Take 1,200 mg by mouth daily. 3-4 at bedtime as needed 06/10/15   Lindell Spar I, NP  naproxen sodium (ALEVE) 220 MG tablet Take 220 mg by mouth as needed.    [provider]  omeprazole (PRILOSEC) 20 MG capsule Take 40 mg by mouth daily.    [provider]  predniSONE  (DELTASONE) 10 MG tablet 6, 5, 4, 3, 2 then 1 tablet by mouth daily for 6 days total. 06/18/20   Idol, Almyra Free, PA-C  rOPINIRole (REQUIP) 1 MG tablet Take 1 tablet (1 mg total) by mouth at bedtime. For restless leg 06/10/15   Lindell Spar I, NP  SUMAtriptan (IMITREX) 50 MG tablet Take 50 mg by mouth every 2 (two) hours as needed for migraine. 03/02/20   [provider]  traZODone (DESYREL) 150 MG tablet Take 150 mg by mouth at bedtime as needed for sleep.    [provider]  zolpidem (AMBIEN) 5 MG tablet Take 1 tablet (5 mg total) by mouth at bedtime as needed for sleep. Patient taking differently: Take 10 mg by mouth at bedtime as needed for sleep. 06/10/15   Encarnacion Slates, NP    Family History Family History  Problem Relation Age of Onset   Hypertension Mother    Diabetes Father    Heart attack Maternal Grandmother    Cancer Maternal Grandmother        Bladder   Anesthesia problems Neg Hx    Liver disease Neg Hx     Social History Social History   Tobacco Use   Smoking status: Former    Packs/day: 0.50    Years: 10.00    Pack years: 5.00    Types: Cigarettes    Quit date: 01/30/2018    Years since quitting: 2.8   Smokeless tobacco: Never   Tobacco comments:    uses vape now  Vaping Use   Vaping Use: Every day  Substance Use Topics   Alcohol use: No   Drug use: Not Currently    Types: Benzodiazepines, Amphetamines     Allergies   Penicillins   Review of Systems Review of Systems Per HPI  Physical Exam Triage Vital Signs ED Triage Vitals  Enc Vitals Group     BP 12/14/20 1722 134/86     Pulse Rate 12/14/20 1722 75     Resp 12/14/20 1722 18     Temp 12/14/20 1722 97.9 F (36.6 C)     Temp src --      SpO2 12/14/20 1722 98 %     Weight --      Height --      Head Circumference --      Peak Flow --      Pain Score 12/14/20 1720 7     Pain Loc --      Pain Edu? --      Excl. in [place]? --    No data found.  Updated Vital Signs BP 134/86  Pulse 75   Temp 97.9 F (36.6 C)   Resp 18   LMP 12/12/2020   SpO2 98%   Visual Acuity Right Eye Distance:   Left Eye Distance:   Bilateral Distance:    Right Eye Near:   Left Eye Near:    Bilateral Near:     Physical Exam Vitals and nursing note reviewed.  Constitutional:      Appearance: Normal appearance. She is not ill-appearing.  HENT:     Head: Atraumatic.     Mouth/Throat:     Mouth: Mucous membranes are moist.     Comments: Large area of dental decay lateral aspect of right posterior molar with surrounding gingival erythema, edema.  No abscess or drainage noted Eyes:     Extraocular Movements: Extraocular movements intact.     Conjunctiva/sclera: Conjunctivae normal.  Cardiovascular:     Rate and Rhythm: Normal rate and regular rhythm.     Heart sounds: Normal heart sounds.  Pulmonary:     Effort: Pulmonary effort is normal.     Breath sounds: Normal breath sounds.  Musculoskeletal:        General: Normal range of motion.     Cervical back: Normal range of motion and neck supple.  Skin:    General: Skin is warm and dry.  Neurological:     Mental Status: She is alert and oriented to person, place, and time.  Psychiatric:        Mood and Affect: Mood normal.        Thought Content: Thought content normal.        Judgment: Judgment normal.     UC Treatments / Results  Labs (all labs ordered are listed, but only abnormal results are displayed) Labs Reviewed - No data to display  EKG   Radiology No results found.  Procedures Procedures (including critical care time)  Medications Ordered in UC Medications - No data to display  Initial Impression / Assessment and Plan / UC Course  I have reviewed the triage vital signs and the nursing notes.  Pertinent labs & imaging results that were available during my care of the patient were reviewed by me and considered in my medical decision making (see chart for details).   We will treat with clindamycin,  viscous lidocaine, continued salt water gargles and over-the-counter pain relievers.  Follow-up with dentist as scheduled.  Return for worsening symptoms.  Final Clinical Impressions(s) / UC Diagnoses   Final diagnoses:  Dental infection   Discharge Instructions   None    ED Prescriptions     Medication Sig Dispense Auth. Provider   clindamycin (CLEOCIN) 300 MG capsule Take 1 capsule (300 mg total) by mouth 2 (two) times daily. 14 capsule Particia Nearing, PA-C   lidocaine (XYLOCAINE) 2 % solution Use as directed 5 mLs in the mouth or throat every 3 (three) hours as needed for mouth pain. 100 mL Particia Nearing, [place]      PDMP not reviewed this encounter.   Particia Nearing, [place] 12/14/20 1812

## 2021-01-13 ENCOUNTER — Encounter: Payer: Self-pay | Admitting: *Deleted

## 2021-02-23 ENCOUNTER — Ambulatory Visit: Payer: No Typology Code available for payment source | Admitting: Gastroenterology

## 2021-02-23 ENCOUNTER — Ambulatory Visit: Payer: No Typology Code available for payment source | Admitting: Internal Medicine

## 2021-03-14 ENCOUNTER — Ambulatory Visit: Payer: No Typology Code available for payment source | Admitting: Gastroenterology

## 2021-06-17 ENCOUNTER — Ambulatory Visit (INDEPENDENT_AMBULATORY_CARE_PROVIDER_SITE_OTHER): Payer: No Typology Code available for payment source | Admitting: Gastroenterology

## 2021-06-17 ENCOUNTER — Encounter: Payer: Self-pay | Admitting: Gastroenterology

## 2021-06-17 VITALS — BP 118/82 | HR 56 | Temp 97.5°F | Ht 64.0 in | Wt 133.4 lb

## 2021-06-17 DIAGNOSIS — D509 Iron deficiency anemia, unspecified: Secondary | ICD-10-CM | POA: Insufficient documentation

## 2021-06-17 DIAGNOSIS — D500 Iron deficiency anemia secondary to blood loss (chronic): Secondary | ICD-10-CM | POA: Diagnosis not present

## 2021-06-17 DIAGNOSIS — B182 Chronic viral hepatitis C: Secondary | ICD-10-CM

## 2021-06-17 NOTE — Progress Notes (Signed)
GI Office Note    Referring Provider: [facility] Primary Care Physician:  [facility]  Primary Gastroenterologist: Hennie Duos. Marletta Lor, DO   Chief Complaint   Chief Complaint  Patient presents with   Follow-up    No current issues to discuss.     History of Present Illness   Marcia Hood is a 41 y.o. female presenting today today for follow-up.  Last seen in May 2022.  She has a history of chronic hepatitis C, PTSD, memory loss, remote history of IV drug use, elevated LFTs.  She is immune to hepatitis A and B.  Previous FibroSure consistent with no fibrosis. U/S without findings of cirrhosis. She completed Epclusa (February 2022) for hepatitis C with negative HCVRNA May 2022.  She presents back to as referral from the [place] for follow up of HCV. She states she had hysterectomy in 03/2021 due to heavy painful menses. She is feeling better now. 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 Fatigue has improved. She is trying to eat a well rounded diet. She is feeling well. No abdominal pain. No N/V. No heartburn. BM regular. No melena, brbpr.   Labs from April 2022: B12 807, total bilirubin 0.2, AST 16, ALT 30, hemoglobin 11.4 normal, platelets 259,000  Labs from October 2022: Albumin 3.7, alkaline phosphatase 49, ALT 30, AST 16, total bilirubin 0.2, hemoglobin 11.4   Medications   Current Outpatient Medications  Medication Sig Dispense Refill   amphetamine-dextroamphetamine (ADDERALL XR) 5 MG 24 hr capsule Take 30 mg by mouth daily. 20 mg in the morning and 10 mg in the afternoon     buPROPion (WELLBUTRIN XL) 150 MG 24 hr tablet Take 150 mg by mouth daily.     cetirizine (ZYRTEC) 10 MG tablet Take 10 mg by mouth as needed. Take as needed for allergies     cyclobenzaprine (FLEXERIL) 5 MG tablet Take 1 tablet (5 mg total) by mouth 3 (three) times daily as needed for muscle spasms. 21 tablet 0   gabapentin (NEURONTIN)  300 MG capsule Take 2 capsules (600 mg total) by mouth 3 (three) times daily. For agitation (Patient taking differently: Take 1,200 mg by mouth daily. 3-4 at bedtime as needed) 180 capsule 0   naproxen sodium (ALEVE) 220 MG tablet Take 220 mg by mouth as needed.     omeprazole (PRILOSEC) 40 MG capsule Take 40 mg by mouth daily.     rOPINIRole (REQUIP) 1 MG tablet Take 1 tablet (1 mg total) by mouth at bedtime. For restless leg     SUMAtriptan (IMITREX) 50 MG tablet Take 50 mg by mouth every 2 (two) hours as needed for migraine.     zolpidem (AMBIEN) 5 MG tablet Take 1 tablet (5 mg total) by mouth at bedtime as needed for sleep. (Patient taking differently: Take 10 mg by mouth at bedtime as needed for sleep.) 7 tablet 0   No current facility-administered medications for this visit.    Allergies   Allergies as of 06/17/2021 - Review Complete 06/17/2021  Allergen Reaction Noted   Penicillins Rash 05/10/2011     Review of Systems   General: Negative for anorexia, weight loss, fever, chills, fatigue, weakness. ENT: Negative for hoarseness, difficulty swallowing , nasal congestion. CV: Negative for chest pain, angina, palpitations, dyspnea on exertion, peripheral edema.  Respiratory: Negative for dyspnea at rest, dyspnea on exertion, cough, sputum, wheezing.  GI: See history  of present illness. GU:  Negative for dysuria, hematuria, urinary incontinence, urinary frequency, nocturnal urination.  Endo: Negative for unusual weight change.     Physical Exam   BP 118/82 (BP Location: Right Arm, Patient Position: Sitting, Cuff Size: Normal)   Pulse (!) 56   Temp (!) 97.5 F (36.4 C) (Temporal)   Ht 5\' 4"  (1.626 m)   Wt 133 lb 6.4 oz (60.5 kg)   LMP 04/07/2021 Comment: Hysterectomy on 04/07/2021  SpO2 98%   BMI 22.90 kg/m    General: Well-nourished, well-developed in no acute distress.  Eyes: No icterus. Mouth: Oropharyngeal mucosa moist and pink , no lesions erythema or exudate. Lungs:  Clear to auscultation bilaterally.  Heart: Regular rate and rhythm, no murmurs rubs or gallops.  Abdomen: Bowel sounds are normal, nontender, nondistended, no hepatosplenomegaly or masses,  no abdominal bruits or hernia , no rebound or guarding.  Rectal: Not performed Extremities: No lower extremity edema. No clubbing or deformities. Neuro: Alert and oriented x 4   Skin: Warm and dry, no jaundice.   Psych: Alert and cooperative, normal mood and affect.  Labs   See HPI  Imaging Studies   No results found.  Assessment   H/O Hep C genotype 1a without fibrosis: completed Epclusa in 03/2020. HCV RNA negative at 3 months after treatment. Patient doing well. We will check HCV RNA once again and if remains negative, there will be no need for additional checks unless she has abnormal LFTs or engages in risky behavior for reinfection. Patient voiced understanding.   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 She underwent hysterectomy in 03/2021. She has been on oral iron. Will update labs at this time. If persistent IDA, she may need colonoscopy to rule out other etiology.   PLAN   HCV RNA, CBC, iron/tibc/ferritin.   04/2021. Leanna Battles, MHS, PA-C [facility]

## 2021-06-17 NOTE — Patient Instructions (Signed)
Please have labs done at your convenience. We will be in touch with results and further recommendations.

## 2021-06-18 LAB — CBC WITH DIFFERENTIAL/PLATELET
Basophils Absolute: 0.1 10*3/uL (ref 0.0–0.2)
Basos: 1 %
EOS (ABSOLUTE): 0.2 10*3/uL (ref 0.0–0.4)
Eos: 3 %
Hematocrit: 35.4 % (ref 34.0–46.6)
Hemoglobin: 12 g/dL (ref 11.1–15.9)
Immature Grans (Abs): 0 10*3/uL (ref 0.0–0.1)
Immature Granulocytes: 0 %
Lymphocytes Absolute: 2.7 10*3/uL (ref 0.7–3.1)
Lymphs: 36 %
MCH: 31.7 pg (ref 26.6–33.0)
MCHC: 33.9 g/dL (ref 31.5–35.7)
MCV: 94 fL (ref 79–97)
Monocytes Absolute: 0.5 10*3/uL (ref 0.1–0.9)
Monocytes: 6 %
Neutrophils Absolute: 4 10*3/uL (ref 1.4–7.0)
Neutrophils: 54 %
Platelets: 305 10*3/uL (ref 150–450)
RBC: 3.78 x10E6/uL (ref 3.77–5.28)
RDW: 12.1 % (ref 11.7–15.4)
WBC: 7.4 10*3/uL (ref 3.4–10.8)

## 2021-06-18 LAB — IRON,TIBC AND FERRITIN PANEL
Ferritin: 42 ng/mL (ref 15–150)
Iron Saturation: 15 % (ref 15–55)
Iron: 48 ug/dL (ref 27–159)
Total Iron Binding Capacity: 331 ug/dL (ref 250–450)
UIBC: 283 ug/dL (ref 131–425)

## 2021-06-18 LAB — HCV RNA QUANT RFLX ULTRA OR GENOTYP: HCV Quant Baseline: NOT DETECTED IU/mL

## 2022-12-20 ENCOUNTER — Encounter: Payer: Self-pay | Admitting: Emergency Medicine

## 2022-12-20 ENCOUNTER — Ambulatory Visit
Admission: EM | Admit: 2022-12-20 | Discharge: 2022-12-20 | Disposition: A | Discharge status: Home / Self Care | Payer: No Typology Code available for payment source | Attending: Family Medicine | Admitting: Family Medicine

## 2022-12-20 ENCOUNTER — Other Ambulatory Visit: Payer: Self-pay

## 2022-12-20 DIAGNOSIS — M25561 Pain in right knee: Secondary | ICD-10-CM | POA: Diagnosis not present

## 2022-12-20 DIAGNOSIS — M25469 Effusion, unspecified knee: Secondary | ICD-10-CM

## 2022-12-20 MED ORDER — DOXYCYCLINE HYCLATE 100 MG PO CAPS: 100.0000 mg | ORAL_CAPSULE | Freq: Two times a day (BID) | ORAL | 0 refills | Status: AC

## 2022-12-20 MED ORDER — DEXAMETHASONE SODIUM PHOSPHATE 10 MG/ML IJ SOLN
10.0000 mg | Freq: Once | INTRAMUSCULAR | Status: AC
  Administered 2022-12-20: 10 mg via INTRAMUSCULAR

## 2022-12-20 NOTE — ED Provider Notes (Signed)
[facility]    CSN: [id number] Arrival date & time: 12/20/22  6295      History   Chief Complaint Chief Complaint  Patient presents with   Knee Pain    HPI Marcia Hood is a 42 y.o. female.   Patient presenting today with 3 to 4-day history of right anterior knee pain, redness, warmth.  Denies any known injury, fever, chills, loss of range of motion, numbness, tingling.  So far not tried anything over-the-counter for symptoms.  No past history of similar.    Past Medical History:  Diagnosis Date   Anxiety    Chronic back pain    Depression    Hepatitis C    Without fibrosis.  Completed Epclusa February 2022.   Hypertension    Migraine    Polysubstance abuse (HCC)    PTSD (post-traumatic stress disorder)     Patient Active Problem List   Diagnosis Date Noted   IDA (iron deficiency anemia) 06/17/2021   Radicular pain 06/16/2020   Chronic constipation 06/16/2020   Encounter for psychological evaluation    Chronic hepatitis C without hepatic coma (HCC) 02/06/2020   Anxiety and depression 02/06/2020   Memory loss 02/06/2020   Tingling sensation 02/06/2020   Positive hepatitis C antibody test 11/18/2019   Left leg cellulitis 06/10/2016   Nicotine dependence 06/10/2016   Severe episode of recurrent major depressive disorder, without psychotic features (HCC)    ADHD (attention deficit hyperactivity disorder) 06/06/2015   PTSD (post-traumatic stress disorder) 06/06/2015   Marijuana abuse 06/06/2015   Supervision of other normal pregnancy 09/06/2012   Maternal chronic hypertension 08/12/2012   High-risk pregnancy 05/16/2012   Antepartum drug dependence (HCC) 05/16/2012    Past Surgical History:  Procedure Laterality Date   ABDOMINAL HYSTERECTOMY     CARPAL TUNNEL RELEASE     CESAREAN SECTION N/A 09/28/2012   Procedure: Primary cesarean section with delivery of baby girl at 1058. ;  Surgeon: Catalina Antigua, MD;  Location: [facility];  Service:  Obstetrics;  Laterality: N/A;   EYE SURGERY     INCISION AND DRAINAGE ABSCESS Left 06/12/2016   Procedure: INCISION AND DRAINAGE OF ABSCESS, LEFT THIGH;  Surgeon: Franky Macho, MD;  Location: [facility];  Service: General;  Laterality: Left;   TUBAL LIGATION      OB History     Gravida  3   Para  3   Term  3   Preterm  0   AB  0   Living  3      SAB  0   IAB  0   Ectopic  0   Multiple  0   Live Births  3            Home Medications    Prior to Admission medications   Medication Sig Start Date End Date Taking? Authorizing Provider  doxycycline (VIBRAMYCIN) 100 MG capsule Take 1 capsule (100 mg total) by mouth 2 (two) times daily. 12/20/22  Yes Particia Nearing, PA-C  amphetamine-dextroamphetamine (ADDERALL XR) 5 MG 24 hr capsule Take 30 mg by mouth daily. 20 mg in the morning and 10 mg in the afternoon 03/23/20   [provider]  buPROPion (WELLBUTRIN XL) 150 MG 24 hr tablet Take 150 mg by mouth daily. 02/09/20   [provider]  cetirizine (ZYRTEC) 10 MG tablet Take 10 mg by mouth as needed. Take as needed for allergies    [provider]  cyclobenzaprine (FLEXERIL) 5 MG tablet  Take 1 tablet (5 mg total) by mouth 3 (three) times daily as needed for muscle spasms. 06/18/20   Burgess Amor, PA-C  gabapentin (NEURONTIN) 300 MG capsule Take 2 capsules (600 mg total) by mouth 3 (three) times daily. For agitation Patient taking differently: Take 1,200 mg by mouth daily. 3-4 at bedtime as needed 06/10/15   Armandina Stammer I, NP  naproxen sodium (ALEVE) 220 MG tablet Take 220 mg by mouth as needed.    [provider]  omeprazole (PRILOSEC) 40 MG capsule Take 40 mg by mouth daily.    [provider]  rOPINIRole (REQUIP) 1 MG tablet Take 1 tablet (1 mg total) by mouth at bedtime. For restless leg 06/10/15   Armandina Stammer I, NP  SUMAtriptan (IMITREX) 50 MG tablet Take 50 mg by mouth every 2 (two) hours as needed for migraine. 03/02/20    [provider]  zolpidem (AMBIEN) 5 MG tablet Take 1 tablet (5 mg total) by mouth at bedtime as needed for sleep. Patient taking differently: Take 10 mg by mouth at bedtime as needed for sleep. 06/10/15   Sanjuana Kava, NP    Family History Family History  Problem Relation Age of Onset   Hypertension Mother    Diabetes Father    Heart attack Maternal Grandmother    Cancer Maternal Grandmother        Bladder   Anesthesia problems Neg Hx    Liver disease Neg Hx    Colon cancer Neg Hx     Social History Social History   Tobacco Use   Smoking status: Former    Current packs/day: 0.00    Average packs/day: 0.5 packs/day for 10.0 years (5.0 ttl pk-yrs)    Types: Cigarettes    Start date: 01/31/2008    Quit date: 01/30/2018    Years since quitting: 4.8   Smokeless tobacco: Never   Tobacco comments:    uses vape now  Vaping Use   Vaping status: Former  Substance Use Topics   Alcohol use: No   Drug use: Not Currently    Types: Benzodiazepines, Amphetamines    Comment: Remote cocaine in remission 2014, brief relapse in 2017     Allergies   Penicillins   Review of Systems Review of Systems Per HPI  Physical Exam Triage Vital Signs ED Triage Vitals  Encounter Vitals Group     BP 12/20/22 0829 (!) 144/84     Systolic BP Percentile --      Diastolic BP Percentile --      Pulse Rate 12/20/22 0829 83     Resp 12/20/22 0829 16     Temp 12/20/22 0829 98.5 F (36.9 C)     Temp Source 12/20/22 0829 Oral     SpO2 12/20/22 0829 98 %     Weight --      Height --      Head Circumference --      Peak Flow --      Pain Score 12/20/22 0830 8     Pain Loc --      Pain Education --      Exclude from Growth Chart --    No data found.  Updated Vital Signs BP (!) 144/84 (BP Location: Right Arm)   Pulse 83   Temp 98.5 F (36.9 C) (Oral)   Resp 16   LMP 04/07/2021 Comment: Hysterectomy on 04/07/2021  SpO2 98%   Visual Acuity Right Eye Distance:   Left Eye  Distance:  Bilateral Distance:    Right Eye Near:   Left Eye Near:    Bilateral Near:     Physical Exam Vitals and nursing note reviewed.  Constitutional:      Appearance: Normal appearance. She is not ill-appearing.  HENT:     Head: Atraumatic.  Eyes:     Extraocular Movements: Extraocular movements intact.     Conjunctiva/sclera: Conjunctivae normal.  Cardiovascular:     Rate and Rhythm: Normal rate and regular rhythm.     Heart sounds: Normal heart sounds.  Pulmonary:     Effort: Pulmonary effort is normal.     Breath sounds: Normal breath sounds.  Musculoskeletal:        General: Swelling and tenderness present. No deformity or signs of injury. Normal range of motion.     Cervical back: Normal range of motion and neck supple.  Skin:    General: Skin is warm and dry.     Findings: Erythema present.     Comments: Well-circumscribed erythematous region to the anterior right knee, warm to the touch, tender to palpation.  Not extending laterally or posteriorly.  No fluctuance, induration, drainage  Neurological:     Mental Status: She is alert and oriented to person, place, and time.     Motor: No weakness.     Gait: Gait normal.     Comments: Right lower extremity neurovascularly intact  Psychiatric:        Mood and Affect: Mood normal.        Thought Content: Thought content normal.        Judgment: Judgment normal.      UC Treatments / Results  Labs (all labs ordered are listed, but only abnormal results are displayed) Labs Reviewed  COMPREHENSIVE METABOLIC PANEL  CBC WITH DIFFERENTIAL/PLATELET  URIC ACID    EKG   Radiology No results found.  Procedures Procedures (including critical care time)  Medications Ordered in UC Medications  dexamethasone (DECADRON) injection 10 mg (10 mg Intramuscular Given 12/20/22 0903)    Initial Impression / Assessment and Plan / UC Course  I have reviewed the triage vital signs and the nursing notes.  Pertinent  labs & imaging results that were available during my care of the patient were reviewed by me and considered in my medical decision making (see chart for details).     Suspect cellulitis, however cannot rule out gout at this time though would suspect that to be more diffuse across the joint.  Labs pending to further evaluate, and will treat with IM Decadron, course of antibiotics, ice, elevation, rest.  Work note given.  Strict return precautions reviewed.  Follow-up with orthopedics if not resolving.  Final Clinical Impressions(s) / UC Diagnoses   Final diagnoses:  Knee swelling  Acute pain of right knee     Discharge Instructions      Based on your exam, I am most suspicious for either gout or a skin infection of the area.  We have obtained some labs today to further evaluate and hopefully get a better idea of which we might be dealing with.  We have given you a steroid shot today in case the issue is gout, antibiotics in case bacterial infection.  You may do ice, elevation and rest, over-the-counter pain relievers as well.    ED Prescriptions     Medication Sig Dispense Auth. Provider   doxycycline (VIBRAMYCIN) 100 MG capsule Take 1 capsule (100 mg total) by mouth 2 (two) times daily. 14 capsule  Particia Nearing, [place]      PDMP not reviewed this encounter.   Particia Nearing, [place] 12/20/22 1017

## 2022-12-20 NOTE — ED Triage Notes (Signed)
Pt reports right knee pain and discomfort and "warm to touch" for last several days, denies any known injury.

## 2022-12-20 NOTE — Discharge Instructions (Signed)
Based on your exam, I am most suspicious for either gout or a skin infection of the area.  We have obtained some labs today to further evaluate and hopefully get a better idea of which we might be dealing with.  We have given you a steroid shot today in case the issue is gout, antibiotics in case bacterial infection.  You may do ice, elevation and rest, over-the-counter pain relievers as well.

## 2022-12-21 LAB — CBC WITH DIFFERENTIAL/PLATELET
Basophils Absolute: 0.1 10*3/uL (ref 0.0–0.2)
Basos: 0 %
EOS (ABSOLUTE): 0.2 10*3/uL (ref 0.0–0.4)
Eos: 2 %
Hematocrit: 37.7 % (ref 34.0–46.6)
Hemoglobin: 12.3 g/dL (ref 11.1–15.9)
Immature Grans (Abs): 0 10*3/uL (ref 0.0–0.1)
Immature Granulocytes: 0 %
Lymphocytes Absolute: 2.2 10*3/uL (ref 0.7–3.1)
Lymphs: 20 %
MCH: 31.5 pg (ref 26.6–33.0)
MCHC: 32.6 g/dL (ref 31.5–35.7)
MCV: 96 fL (ref 79–97)
Monocytes Absolute: 0.8 10*3/uL (ref 0.1–0.9)
Monocytes: 7 %
Neutrophils Absolute: 7.9 10*3/uL — ABNORMAL HIGH (ref 1.4–7.0)
Neutrophils: 71 %
Platelets: 303 10*3/uL (ref 150–450)
RBC: 3.91 x10E6/uL (ref 3.77–5.28)
RDW: 12 % (ref 11.7–15.4)
WBC: 11.3 10*3/uL — ABNORMAL HIGH (ref 3.4–10.8)

## 2022-12-21 LAB — COMPREHENSIVE METABOLIC PANEL
ALT: 19 [IU]/L (ref 0–32)
AST: 17 [IU]/L (ref 0–40)
Albumin: 4.5 g/dL (ref 3.9–4.9)
Alkaline Phosphatase: 62 [IU]/L (ref 44–121)
BUN/Creatinine Ratio: 14 (ref 9–23)
BUN: 11 mg/dL (ref 6–24)
Bilirubin Total: 0.3 mg/dL (ref 0.0–1.2)
CO2: 21 mmol/L (ref 20–29)
Calcium: 9.7 mg/dL (ref 8.7–10.2)
Chloride: 103 mmol/L (ref 96–106)
Creatinine, Ser: 0.8 mg/dL (ref 0.57–1.00)
Globulin, Total: 2.6 g/dL (ref 1.5–4.5)
Glucose: 90 mg/dL (ref 70–99)
Potassium: 4.6 mmol/L (ref 3.5–5.2)
Sodium: 140 mmol/L (ref 134–144)
Total Protein: 7.1 g/dL (ref 6.0–8.5)
eGFR: 95 mL/min/{1.73_m2} (ref >59)

## 2022-12-21 LAB — URIC ACID: Uric Acid: 4.5 mg/dL (ref 2.6–6.2)

## 2023-01-16 IMAGING — DX DG HIP (WITH OR WITHOUT PELVIS) 2-3V*R*
3 series · 3 of 3 positions shown · non-contrast
Comparison: None.

CLINICAL DATA: Hip pain

EXAM:
DG HIP (WITH OR WITHOUT PELVIS) 2-3V RIGHT

[pelvis ap]
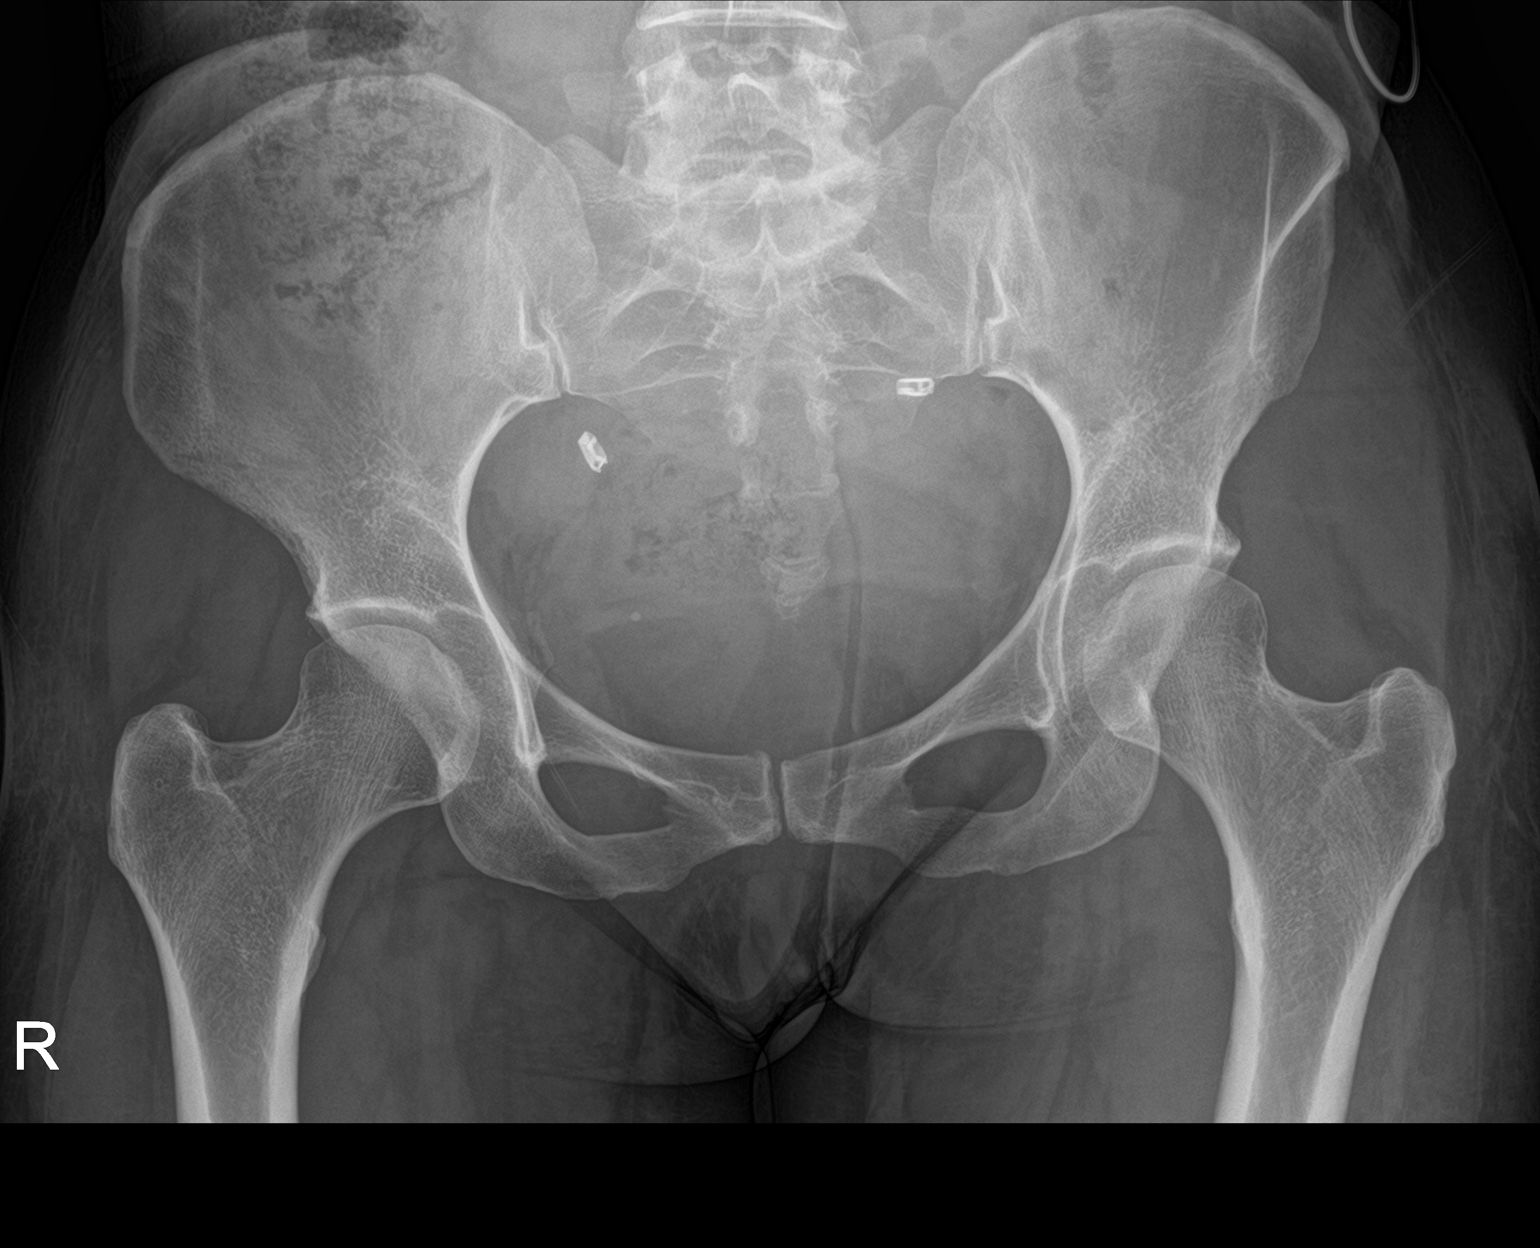

[hip ap]
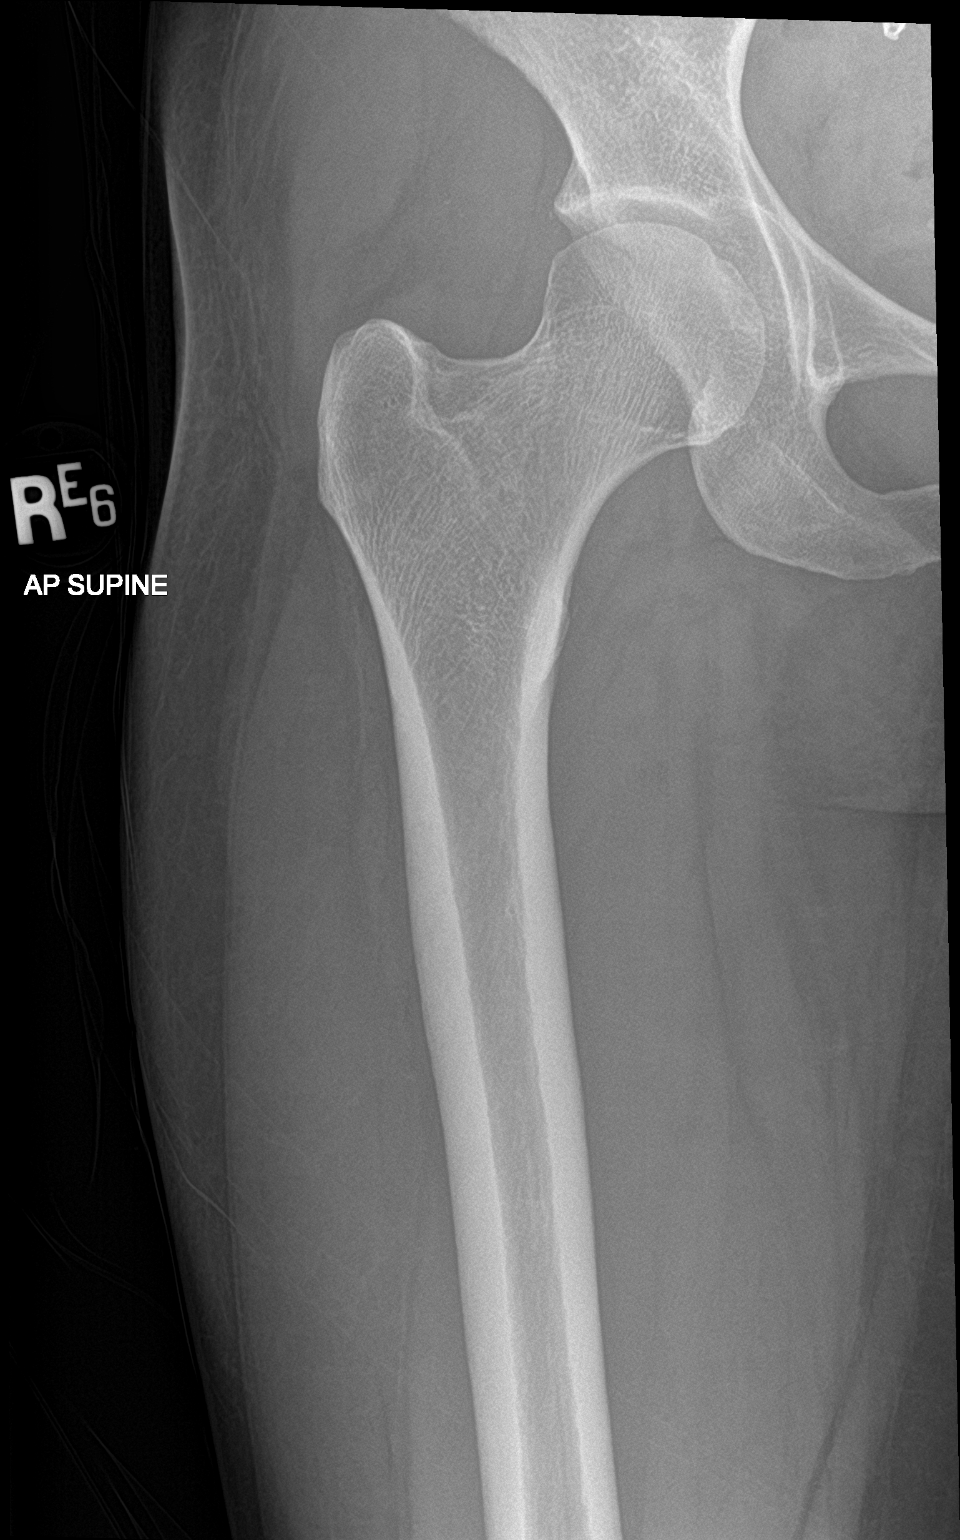

[hip lat]
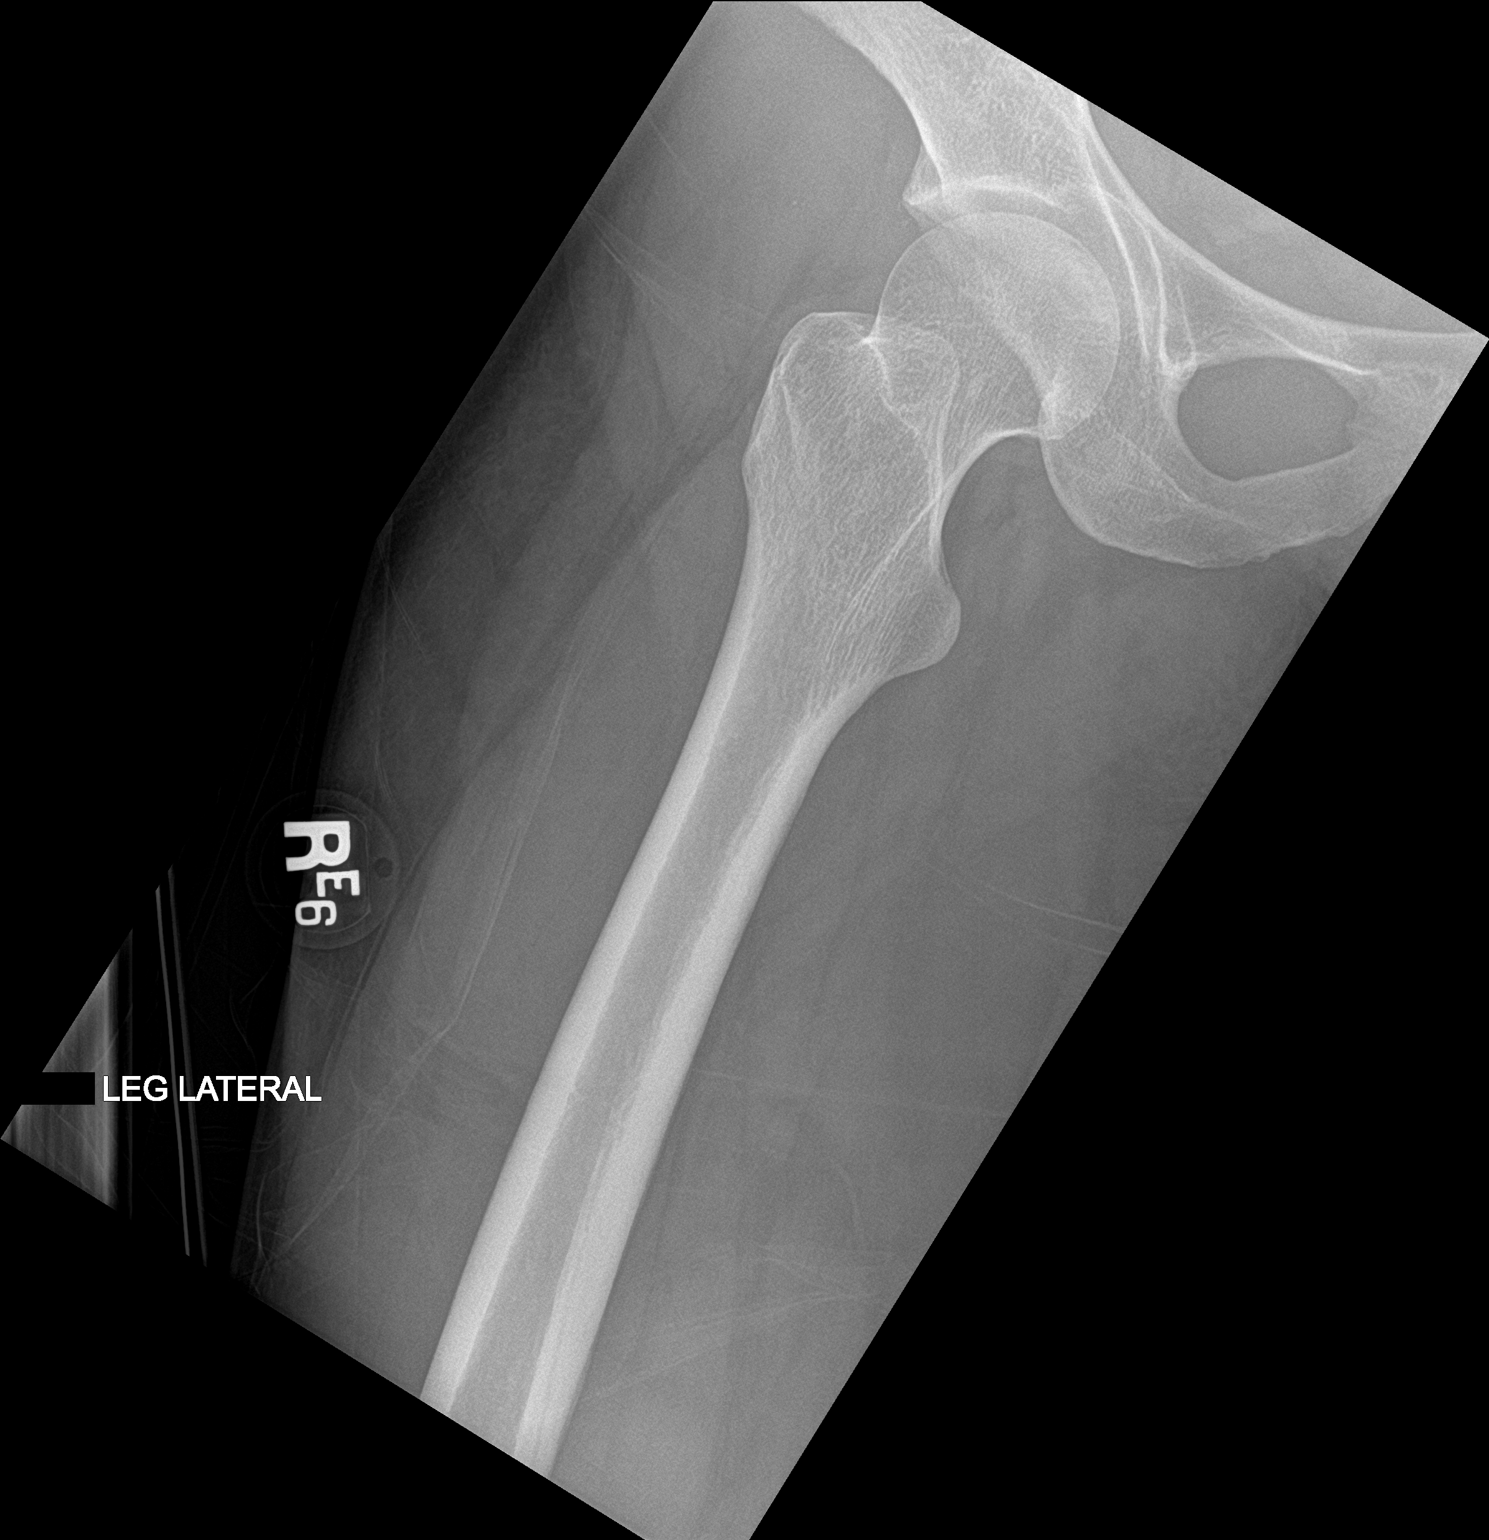

[3 of 3 positions shown; findings below may reference images not displayed]

FINDINGS: There is no evidence of hip fracture or dislocation. There is no
evidence of arthropathy or other focal bone abnormality.
IMPRESSION: Negative.

## 2023-04-20 IMAGING — DX DG HAND COMPLETE 3+V*R*
3 series · 3 of 3 positions shown · non-contrast
Comparison: March 02, 2013

CLINICAL DATA: Bilateral hand tingling x2 weeks.

EXAM:
RIGHT HAND - COMPLETE 3+ VIEW

[hand pa]
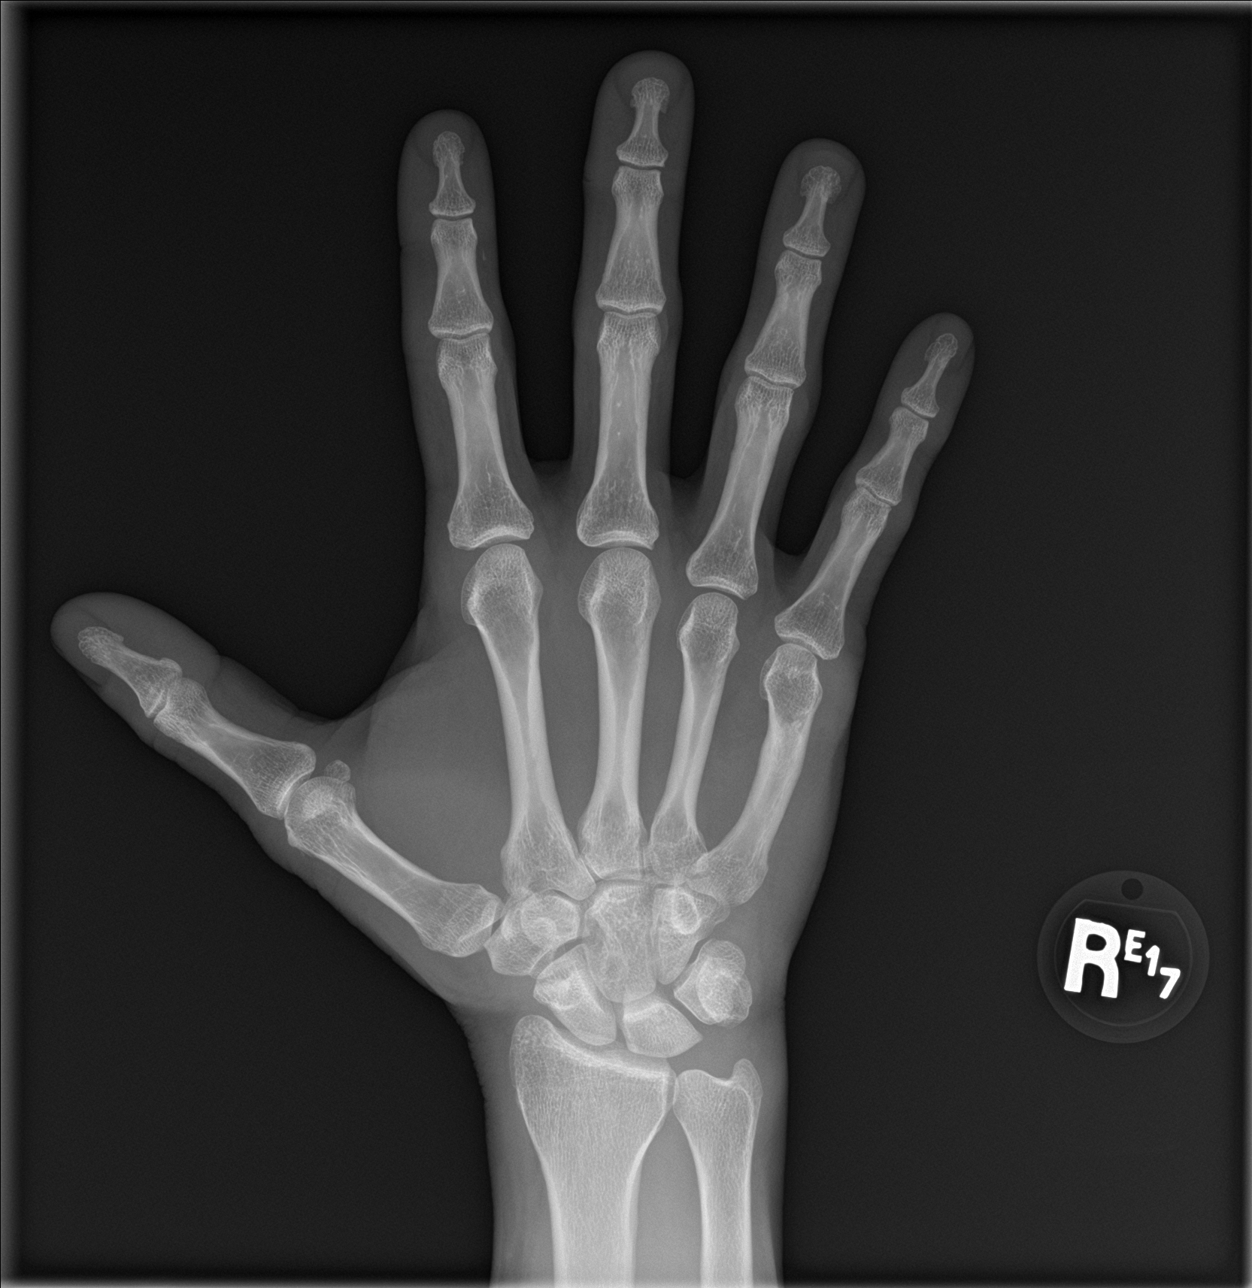

[hand obl]
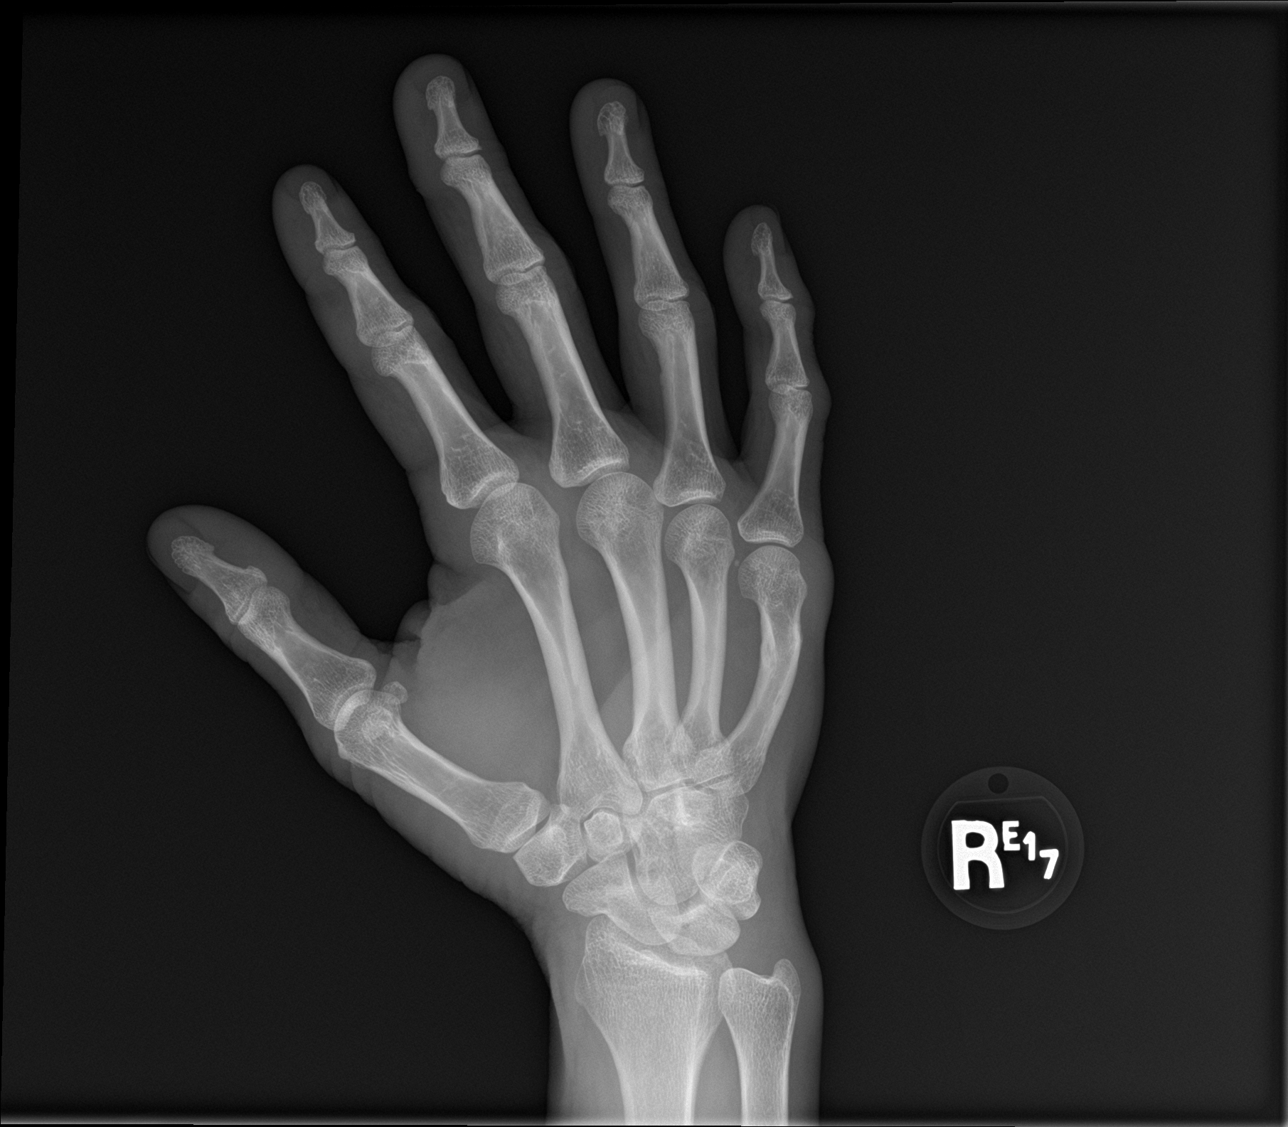

[hand lat]
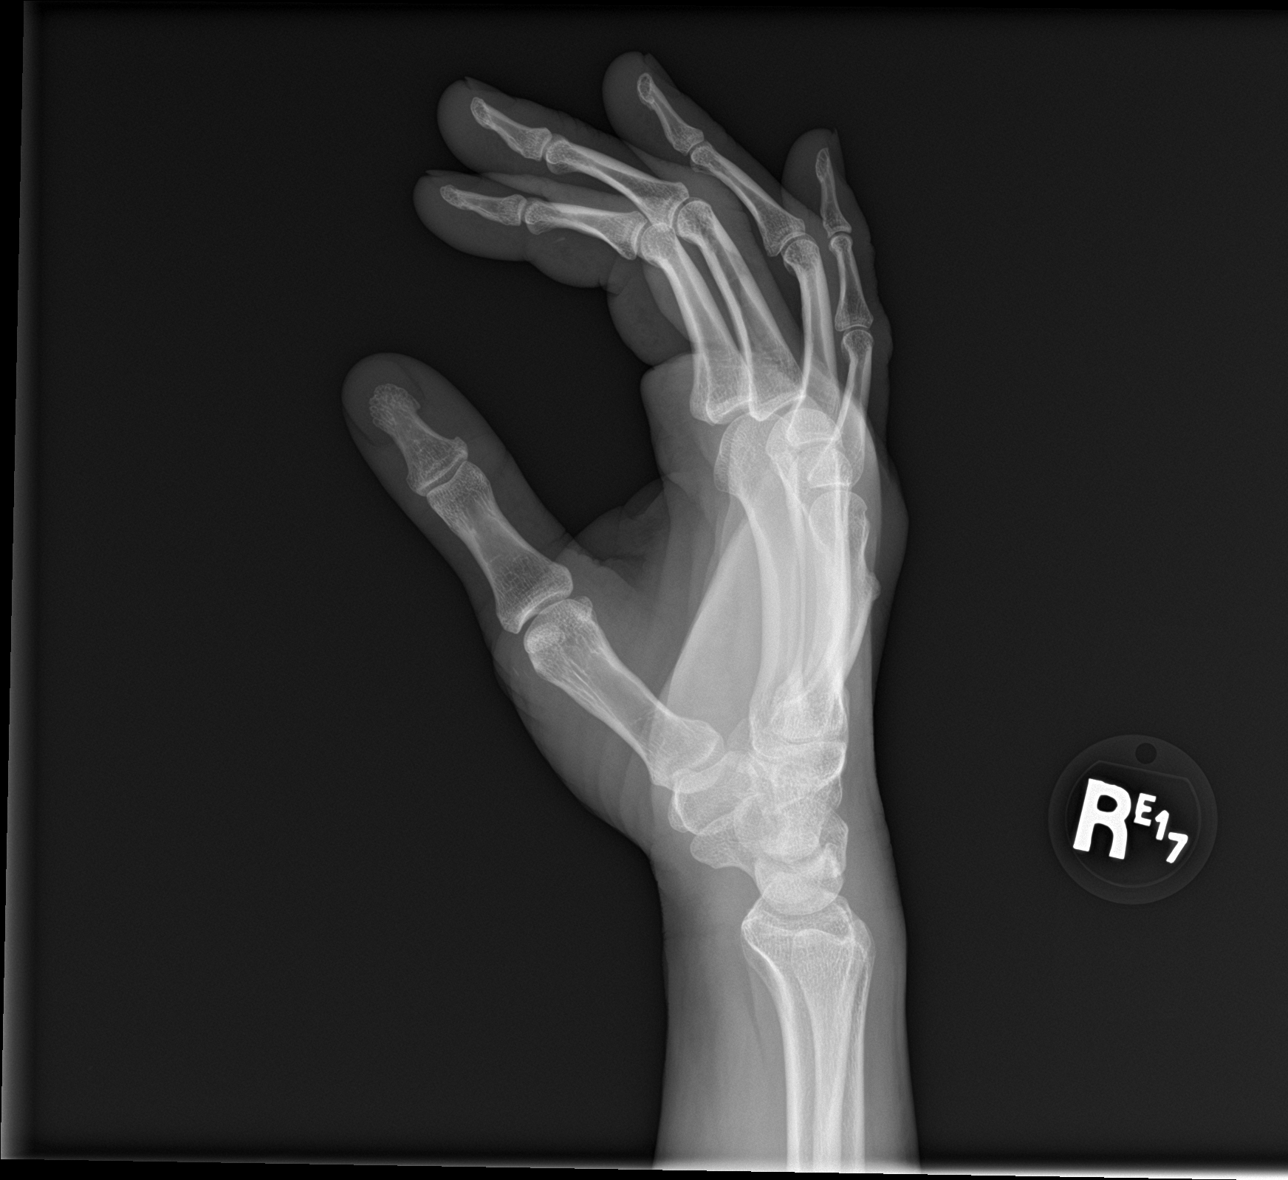

[3 of 3 positions shown; findings below may reference images not displayed]

FINDINGS: There is no evidence of an acute fracture or dislocation. A chronic
fracture of the fifth right metacarpal is seen. There is no evidence
of arthropathy or other focal bone abnormality. Soft tissues are
unremarkable.
IMPRESSION: No acute osseous abnormality.

## 2024-02-28 ENCOUNTER — Other Ambulatory Visit: Payer: Self-pay | Admitting: Internal Medicine

## 2024-02-28 DIAGNOSIS — R928 Other abnormal and inconclusive findings on diagnostic imaging of breast: Secondary | ICD-10-CM
